# Patient Record
Sex: Female | Born: 1960 | Race: White | Hispanic: No | Marital: Married | Smoking: Former smoker
Health system: Southern US, Community
[De-identification: ages and names within clinical notes are randomized; demographics above are authoritative.]

## PROBLEM LIST (undated history)

## (undated) DIAGNOSIS — Z800 Family history of malignant neoplasm of digestive organs: Secondary | ICD-10-CM

## (undated) DIAGNOSIS — M3130 Wegener's granulomatosis without renal involvement: Secondary | ICD-10-CM

## (undated) DIAGNOSIS — Z803 Family history of malignant neoplasm of breast: Secondary | ICD-10-CM

## (undated) DIAGNOSIS — C801 Malignant (primary) neoplasm, unspecified: Secondary | ICD-10-CM

## (undated) DIAGNOSIS — Z9221 Personal history of antineoplastic chemotherapy: Secondary | ICD-10-CM

## (undated) DIAGNOSIS — Z923 Personal history of irradiation: Secondary | ICD-10-CM

## (undated) DIAGNOSIS — Z808 Family history of malignant neoplasm of other organs or systems: Secondary | ICD-10-CM

## (undated) DIAGNOSIS — Z8489 Family history of other specified conditions: Secondary | ICD-10-CM

## (undated) DIAGNOSIS — Z5189 Encounter for other specified aftercare: Secondary | ICD-10-CM

## (undated) HISTORY — PX: ABDOMINAL HYSTERECTOMY: SHX81

## (undated) HISTORY — PX: LUNG BIOPSY: SHX232

## (undated) HISTORY — DX: Family history of malignant neoplasm of breast: Z80.3

## (undated) HISTORY — PX: COLONOSCOPY: SHX174

## (undated) HISTORY — DX: Family history of malignant neoplasm of digestive organs: Z80.0

## (undated) HISTORY — DX: Family history of malignant neoplasm of other organs or systems: Z80.8

## (undated) HISTORY — PX: WISDOM TOOTH EXTRACTION: SHX21

## (undated) HISTORY — DX: Encounter for other specified aftercare: Z51.89

---

## 2019-07-02 ENCOUNTER — Other Ambulatory Visit: Payer: Self-pay | Admitting: Family Medicine

## 2019-07-02 DIAGNOSIS — N6310 Unspecified lump in the right breast, unspecified quadrant: Secondary | ICD-10-CM

## 2019-07-13 ENCOUNTER — Ambulatory Visit: Payer: BC Managed Care – PPO | Attending: Internal Medicine

## 2019-07-13 DIAGNOSIS — Z23 Encounter for immunization: Secondary | ICD-10-CM

## 2019-07-13 NOTE — Progress Notes (Signed)
   [facility] Vaccination [facility]  Name:  Debra [name]    MRN: [id number] DOB: 1961/02/27  07/13/2019  [name] was observed post Covid-19 immunization for 15 minutes without incident. She was provided with Vaccine Information Sheet and instruction to access the V-Safe system.   [name] was instructed to call 911 with any severe reactions post vaccine: Marland Kitchen Difficulty breathing  . Swelling of face and throat  . A fast heartbeat  . A bad rash all over body  . Dizziness and weakness   Immunizations Administered    Name Date Dose VIS Date Route   Pfizer COVID-19 Vaccine 07/13/2019  1:21 PM 0.3 mL 04/18/2019 Intramuscular   Manufacturer: [place]   Lot: [id number]   [place]: [id number]

## 2019-07-15 ENCOUNTER — Ambulatory Visit
Admission: RE | Admit: 2019-07-15 | Discharge: 2019-07-15 | Disposition: A | Discharge status: Home / Self Care | Payer: BC Managed Care – PPO | Source: Ambulatory Visit | Attending: Family Medicine | Admitting: Family Medicine

## 2019-07-15 ENCOUNTER — Other Ambulatory Visit: Payer: Self-pay | Admitting: Family Medicine

## 2019-07-15 ENCOUNTER — Ambulatory Visit (HOSPITAL_COMMUNITY): Admission: RE | Admit: 2019-07-15 | Payer: BC Managed Care – PPO | Source: Ambulatory Visit

## 2019-07-15 ENCOUNTER — Other Ambulatory Visit: Payer: Self-pay

## 2019-07-15 DIAGNOSIS — N6320 Unspecified lump in the left breast, unspecified quadrant: Secondary | ICD-10-CM

## 2019-07-15 DIAGNOSIS — R599 Enlarged lymph nodes, unspecified: Secondary | ICD-10-CM

## 2019-07-15 DIAGNOSIS — N6310 Unspecified lump in the right breast, unspecified quadrant: Secondary | ICD-10-CM

## 2019-07-21 ENCOUNTER — Ambulatory Visit
Admission: RE | Admit: 2019-07-21 | Discharge: 2019-07-21 | Disposition: A | Discharge status: Home / Self Care | Payer: BC Managed Care – PPO | Source: Ambulatory Visit | Attending: Family Medicine | Admitting: Family Medicine

## 2019-07-21 ENCOUNTER — Other Ambulatory Visit: Payer: Self-pay

## 2019-07-21 DIAGNOSIS — N6310 Unspecified lump in the right breast, unspecified quadrant: Secondary | ICD-10-CM

## 2019-07-21 DIAGNOSIS — R599 Enlarged lymph nodes, unspecified: Secondary | ICD-10-CM

## 2019-07-22 ENCOUNTER — Encounter: Payer: Self-pay | Admitting: *Deleted

## 2019-07-23 ENCOUNTER — Other Ambulatory Visit: Payer: Self-pay | Admitting: *Deleted

## 2019-07-23 DIAGNOSIS — Z170 Estrogen receptor positive status [ER+]: Secondary | ICD-10-CM | POA: Insufficient documentation

## 2019-07-23 DIAGNOSIS — C50211 Malignant neoplasm of upper-inner quadrant of right female breast: Secondary | ICD-10-CM | POA: Insufficient documentation

## 2019-07-29 NOTE — Progress Notes (Signed)
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  It appeared to increase in size over the next 3 weeks. Physical exam performed at [facility] 07/15/2019 confirmed a 6 mm firm, oval, palpable mass in the upper-inner right breast. She underwent bilateral diagnostic mammography with tomography and bilateral breast ultrasonography on 07/15/2019 showing: breast density category C; 4.6 cm mass in the right breast at 1 o'clock; single abnormal-appearing lymph node; bilateral benign cysts.  Accordingly on 07/21/2019 she proceeded to biopsy of the right breast area in question. 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 Prognostic indicators significant for: estrogen receptor, 60% positive with weak staining intensity and progesterone receptor, 0% negative. Proliferation marker Ki67 at 90%. HER2 equivocal by immunohistochemistry (2+), but negative by fluorescent in situ hybridization with a signals ratio 1.24 and number per cell 2.55.  The questionable right axillary lymph node was biopsied as well and was benign (concordant).  The patient's subsequent history is as detailed  below.   INTERVAL HISTORY: Debra [name] was evaluated in the multidisciplinary breast cancer clinic on 07/30/2019 accompanied by her husband Debra [name] Her case was also presented at the multidisciplinary breast cancer conference on the same day. At that time a preliminary plan was proposed: Neoadjuvant chemotherapy, definitive surgery with sentinel lymph node sampling, adjuvant radiation.   REVIEW OF SYSTEMS: The patient denies unusual headaches, visual changes, nausea, vomiting, stiff neck, dizziness, or gait imbalance. There has been no cough, phlegm production, or pleurisy, no chest pain or pressure, and no change in bowel or bladder habits. The patient denies fever, rash, bleeding, unexplained fatigue or unexplained weight loss.  She exercises regularly by walking between 6 and 10,000 steps most days, also plays golf and does tai chi most mornings.  A detailed review of systems was otherwise entirely negative.   PAST MEDICAL HISTORY: History reviewed. No pertinent past medical history.  PAST SURGICAL HISTORY: Past Surgical History:  Procedure Laterality Date   ABDOMINAL HYSTERECTOMY     LUNG BIOPSY    Status post bilateral salpingo-oophorectomy  FAMILY HISTORY: Family History  Problem Relation Age of Onset   Skin cancer Sister    Colon cancer Paternal Grandfather    Wilm's tumor Son     Her father is 60 years old as of 07/2019. Her mother was murdered at age 8 (domestic violence).  The patient has 3 sisters and no brothers. She reports a third cousin (grandmother's sister's daughter) with breast cancer in her 11's. She denies a family history of ovarian, prostate, or pancreatic cancer. She does report colon cancer in her paternal grandfather in his 39's, non-melanoma skin cancer in her sister in her 12's, and Wilms tumor in her son at 58 months.   GYNECOLOGIC HISTORY:  Patient's last menstrual period was 05/09/2011. Menarche: 60-40 years old Age at first live birth:  59 years old [place]  P 3 LMP 2013 Contraceptive never used HRT used for approximately 9 months  Hysterectomy? Yes, 2013 BSO? 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 Husband Debra [name] is an Art gallery manager. She lives at home with husband Debra [name] Daughter Debra [name], age 6, is a poet and Pharmacist, hospital in [place], [place]. Son Debra [name], age 60, is an Production designer, theatre/television/film in [place] in [place], [place]. Son Debra [name], age 60, is a Marketing executive working in Artist in [place], [place] (at the [employer]). Franceen has three grandchildren. She is a Media planner.    ADVANCED DIRECTIVES: In the absence of any documentation to the contrary, the patient's spouse is their HCPOA.    HEALTH MAINTENANCE: Social History   Tobacco Use   Smoking status: Former Smoker   Smokeless tobacco: Never Used  Substance Use Topics   Alcohol use: Yes   Drug use: Never     Colonoscopy: none on file  PAP: none on file (s/p hysterectomy)  Bone density: n/a (age)   No Known Allergies  Current Outpatient Medications  Medication Sig Dispense Refill   Multiple Vitamin (MULTI-VITAMIN DAILY PO) Take by mouth.     UNABLE TO FIND Omega Krill Oil     dexamethasone (DECADRON) 4 MG tablet Take 2 tablets by mouth daily starting the day after Carboplatin and Cytoxan x 3 days. Take with food. 30 tablet 1   lidocaine-prilocaine (EMLA) cream Apply to affected area once 30 g 3   loratadine (CLARITIN) 10 MG tablet Take 1 tablet (10 mg total) by mouth daily. 60 tablet 0   LORazepam (ATIVAN) 0.5 MG tablet Take 1 tablet (0.5 mg total) by mouth at bedtime as needed (Nausea or vomiting). 30 tablet 0   prochlorperazine (COMPAZINE) 10 MG tablet Take 1 tablet (10 mg total) by mouth every 6 (six) hours as needed (Nausea or vomiting). 30 tablet 1   No current facility-administered medications for this visit.    OBJECTIVE: White woman who appears  younger than stated age  59:   07/30/19 0856  BP: (!) 152/75  Pulse: 75  Resp: 18  Temp: (!) 97.4 F (36.3 C)  SpO2: 100%     There is no height or weight on file to calculate BMI.   Wt Readings from Last 3 Encounters:  07/30/19 206 lb 14.4 oz (93.8 kg)      ECOG FS:1 - Symptomatic but completely ambulatory  Ocular: Sclerae unicteric, pupils round and equal Ear-nose-throat: Wearing a mask Lymphatic: No cervical or supraclavicular adenopathy Lungs no rales or rhonchi Heart regular rate and rhythm Abd soft, nontender, positive bowel sounds MSK no focal spinal tenderness, no joint edema Neuro: non-focal, well-oriented, appropriate affect Breasts: The right breast is status post recent biopsy.  There is no significant ecchymosis.  There is an easily palpable mass in the upper inner quadrant which is movable, not associated with any skin or nipple change.  Left breast is benign.  Both axillae are benign.   LAB RESULTS:  CMP     Component Value Date/Time   NA 142 07/30/2019 0825   K 4.0 07/30/2019 0825   CL 110 07/30/2019 0825   CO2 25 07/30/2019 0825   GLUCOSE 88 07/30/2019 0825   BUN 11 07/30/2019 0825   CREATININE 0.76 07/30/2019 0825   CALCIUM 9.6 07/30/2019 0825   PROT 7.1 07/30/2019 0825   ALBUMIN 4.0 07/30/2019 0825   AST 14 (  L) 07/30/2019 0825   ALT 11 07/30/2019 0825   ALKPHOS 88 07/30/2019 0825   BILITOT 0.4 07/30/2019 0825   GFRNONAA >60 07/30/2019 0825   GFRAA >60 07/30/2019 0825    No results found for: TOTALPROTELP, ALBUMINELP, A1GS, A2GS, BETS, BETA2SER, GAMS, MSPIKE, SPEI  Lab Results  Component Value Date   WBC 7.8 07/30/2019   NEUTROABS 5.1 07/30/2019   HGB 13.2 07/30/2019   HCT 42.0 07/30/2019   MCV 89.4 07/30/2019   PLT 291 07/30/2019    No results found for: LABCA2  No components found for: GLOVFI433  No results for input(s): INR in the last 168 hours.  No results found for: LABCA2  No results found for: IRJ188  No results  found for: CZY606  No results found for: TKZ601  No results found for: CA2729  No components found for: HGQUANT  No results found for: CEA1 / No results found for: CEA1   No results found for: AFPTUMOR  No results found for: CHROMOGRNA  No results found for: KPAFRELGTCHN, LAMBDASER, KAPLAMBRATIO (kappa/lambda light chains)  No results found for: HGBA, HGBA2QUANT, HGBFQUANT, HGBSQUAN (Hemoglobinopathy evaluation)   No results found for: LDH  No results found for: IRON, TIBC, IRONPCTSAT (Iron and TIBC)  No results found for: FERRITIN  Urinalysis No results found for: COLORURINE, APPEARANCEUR, LABSPEC, PHURINE, GLUCOSEU, HGBUR, BILIRUBINUR, KETONESUR, PROTEINUR, UROBILINOGEN, NITRITE, LEUKOCYTESUR   STUDIES: US BREAST LTD UNI LEFT INC AXILLA  Result Date: 07/15/2019 CLINICAL DATA:  Mass felt by the patient in the upper inner right breast for the past 3 weeks, increased in size during that time. EXAM: DIGITAL DIAGNOSTIC BILATERAL MAMMOGRAM WITH CAD AND TOMO ULTRASOUND BILATERAL BREAST COMPARISON:  Previous examinations in [place], the most recent dated 09/11/2016. ACR Breast Density Category c: The breast tissue is heterogeneously dense, which may obscure small masses. FINDINGS: There is an interval irregular mass with some circumscribed and some indistinct margins in the upper inner right breast, corresponding to the mass felt by the patient, marked with a metallic marker. Multiple interval small, rounded and oval, circumscribed masses are demonstrated in the upper outer and upper inner quadrants of the left breast. There are no abnormal appearing axillary lymph nodes on either side. Mammographic images were processed with CAD. On physical exam, there is an approximately 6 x 5 cm firm, oval palpable mass in the upper inner quadrant of the right breast. There are no palpable axillary nodes on the right. Targeted ultrasound is performed, showing an irregular, heterogeneous, predominantly  hypoechoic mass in 1 o'clock position of the right breast, 5 cm from the nipple, corresponding to the palpable mass. This measures 4.6 x 3.5 x 1.9 cm. There is some surrounding ill-defined increased echogenicity. There is a nearby 5 mm cyst containing low-level internal echoes in the 12:30 o'clock position of the right breast, 3 cm from the nipple. This appeared more clearly cystic at real-time with harmonics. Ultrasound of the right axilla demonstrated a single right axillary lymph node with eccentric cortical thickening measuring 3.6 mm in maximum thickness. The remainder of the right axillary lymph nodes have normal appearances. Ultrasound of the left breast demonstrates multiple cysts. The largest is a bilobed cyst with some internal debris in the 1 o'clock retroareolar region. This corresponds to the largest mammographic mass. No solid masses were seen. IMPRESSION: 1. 4.6 cm mass in the 1 o'clock position of the right breast with imaging features highly suspicious for malignancy. 2. Single abnormal appearing right axillary lymph node suspicious for a  metastatic node. 3. Bilateral benign breast cysts. RECOMMENDATION: Ultrasound-guided core needle biopsy of the 4.6 cm mass in the 1 o'clock of the right breast and ultrasound-guided core needle biopsy of the abnormal appearing right axillary lymph node. This has been discussed with the patient and the biopsies have been scheduled at 1:45 p.m. on 07/21/2019. I have discussed the findings and recommendations with the patient. If applicable, a reminder letter will be sent to the patient regarding the next appointment. BI-RADS CATEGORY  5: Highly suggestive of malignancy. Electronically Signed   By: Claudie Revering M.D.   On: 07/15/2019 16:04   US BREAST LTD UNI RIGHT INC AXILLA  Result Date: 07/15/2019 CLINICAL DATA:  Mass felt by the patient in the upper inner right breast for the past 3 weeks, increased in size during that time. EXAM: DIGITAL DIAGNOSTIC BILATERAL  MAMMOGRAM WITH CAD AND TOMO ULTRASOUND BILATERAL BREAST COMPARISON:  Previous examinations in [place], the most recent dated 09/11/2016. ACR Breast Density Category c: The breast tissue is heterogeneously dense, which may obscure small masses. FINDINGS: There is an interval irregular mass with some circumscribed and some indistinct margins in the upper inner right breast, corresponding to the mass felt by the patient, marked with a metallic marker. Multiple interval small, rounded and oval, circumscribed masses are demonstrated in the upper outer and upper inner quadrants of the left breast. There are no abnormal appearing axillary lymph nodes on either side. Mammographic images were processed with CAD. On physical exam, there is an approximately 6 x 5 cm firm, oval palpable mass in the upper inner quadrant of the right breast. There are no palpable axillary nodes on the right. Targeted ultrasound is performed, showing an irregular, heterogeneous, predominantly hypoechoic mass in 1 o'clock position of the right breast, 5 cm from the nipple, corresponding to the palpable mass. This measures 4.6 x 3.5 x 1.9 cm. There is some surrounding ill-defined increased echogenicity. There is a nearby 5 mm cyst containing low-level internal echoes in the 12:30 o'clock position of the right breast, 3 cm from the nipple. This appeared more clearly cystic at real-time with harmonics. Ultrasound of the right axilla demonstrated a single right axillary lymph node with eccentric cortical thickening measuring 3.6 mm in maximum thickness. The remainder of the right axillary lymph nodes have normal appearances. Ultrasound of the left breast demonstrates multiple cysts. The largest is a bilobed cyst with some internal debris in the 1 o'clock retroareolar region. This corresponds to the largest mammographic mass. No solid masses were seen. IMPRESSION: 1. 4.6 cm mass in the 1 o'clock position of the right breast with imaging features highly  suspicious for malignancy. 2. Single abnormal appearing right axillary lymph node suspicious for a metastatic node. 3. Bilateral benign breast cysts. RECOMMENDATION: Ultrasound-guided core needle biopsy of the 4.6 cm mass in the 1 o'clock of the right breast and ultrasound-guided core needle biopsy of the abnormal appearing right axillary lymph node. This has been discussed with the patient and the biopsies have been scheduled at 1:45 p.m. on 07/21/2019. I have discussed the findings and recommendations with the patient. If applicable, a reminder letter will be sent to the patient regarding the next appointment. BI-RADS CATEGORY  5: Highly suggestive of malignancy. Electronically Signed   By: Claudie Revering M.D.   On: 07/15/2019 16:04   MM DIAG BREAST TOMO BILATERAL  Result Date: 07/15/2019 CLINICAL DATA:  Mass felt by the patient in the upper inner right breast for the past 3 weeks, increased  in size during that time. EXAM: DIGITAL DIAGNOSTIC BILATERAL MAMMOGRAM WITH CAD AND TOMO ULTRASOUND BILATERAL BREAST COMPARISON:  Previous examinations in [place], the most recent dated 09/11/2016. ACR Breast Density Category c: The breast tissue is heterogeneously dense, which may obscure small masses. FINDINGS: There is an interval irregular mass with some circumscribed and some indistinct margins in the upper inner right breast, corresponding to the mass felt by the patient, marked with a metallic marker. Multiple interval small, rounded and oval, circumscribed masses are demonstrated in the upper outer and upper inner quadrants of the left breast. There are no abnormal appearing axillary lymph nodes on either side. Mammographic images were processed with CAD. On physical exam, there is an approximately 6 x 5 cm firm, oval palpable mass in the upper inner quadrant of the right breast. There are no palpable axillary nodes on the right. Targeted ultrasound is performed, showing an irregular, heterogeneous, predominantly  hypoechoic mass in 1 o'clock position of the right breast, 5 cm from the nipple, corresponding to the palpable mass. This measures 4.6 x 3.5 x 1.9 cm. There is some surrounding ill-defined increased echogenicity. There is a nearby 5 mm cyst containing low-level internal echoes in the 12:30 o'clock position of the right breast, 3 cm from the nipple. This appeared more clearly cystic at real-time with harmonics. Ultrasound of the right axilla demonstrated a single right axillary lymph node with eccentric cortical thickening measuring 3.6 mm in maximum thickness. The remainder of the right axillary lymph nodes have normal appearances. Ultrasound of the left breast demonstrates multiple cysts. The largest is a bilobed cyst with some internal debris in the 1 o'clock retroareolar region. This corresponds to the largest mammographic mass. No solid masses were seen. IMPRESSION: 1. 4.6 cm mass in the 1 o'clock position of the right breast with imaging features highly suspicious for malignancy. 2. Single abnormal appearing right axillary lymph node suspicious for a metastatic node. 3. Bilateral benign breast cysts. RECOMMENDATION: Ultrasound-guided core needle biopsy of the 4.6 cm mass in the 1 o'clock of the right breast and ultrasound-guided core needle biopsy of the abnormal appearing right axillary lymph node. This has been discussed with the patient and the biopsies have been scheduled at 1:45 p.m. on 07/21/2019. I have discussed the findings and recommendations with the patient. If applicable, a reminder letter will be sent to the patient regarding the next appointment. BI-RADS CATEGORY  5: Highly suggestive of malignancy. Electronically Signed   By: Claudie Revering M.D.   On: 07/15/2019 16:04   [place] AXILLARY NODE CORE BIOPSY RIGHT  Addendum Date: 07/23/2019   ADDENDUM REPORT: 07/22/2019 15:04 ADDENDUM: Pathology revealed POORLY DIFFERENTIATED GRADE III INVASIVE DUCTAL CARCINOMA WITH METAPLASTIC FEATURES of the RIGHT  breast, 1 o'clock. This was found to be concordant by Dr. Dorise Bullion. Pathology revealed BENIGN LYMPH NODE of the RIGHT axilla. This was found to be concordant by Dr. Dorise Bullion. Pathology results were discussed with the patient by telephone. The patient reported doing well after the biopsies with tenderness at the sites. Post biopsy instructions and care were reviewed and questions were answered. The patient was encouraged to call [facility] for any additional concerns. The patient was referred to [facility] on July 30, 2019. Pathology results reported by Stacie Acres RN on 07/22/2019. Electronically Signed   By: Dorise Bullion III M.D   On: 07/22/2019 15:04   Result Date: 07/23/2019 CLINICAL DATA:  Biopsy of a right breast mass and a right axillary lymph node. EXAM: ULTRASOUND GUIDED RIGHT BREAST CORE NEEDLE BIOPSY COMPARISON:  Previous exam(s). PROCEDURE: I met with the patient and we discussed the procedure of ultrasound-guided biopsy, including benefits and alternatives. We discussed the high likelihood of a successful procedure. We discussed the risks of the procedure, including infection, bleeding, tissue injury, clip migration, and inadequate sampling. Informed written consent was given. The usual time-out protocol was performed immediately prior to the procedure. Lesion quadrant: 1 o'clock Using sterile technique and 1% Lidocaine as local anesthetic, under direct ultrasound visualization, a 12 gauge spring-loaded device was used to perform biopsy of a 1 o'clock right breast mass using a lateral approach. At the conclusion of the procedure tissue marker clip was deployed into the biopsy cavity. Follow up 2 view mammogram was performed and dictated separately. I met with the patient and we discussed the procedure of ultrasound-guided biopsy, including benefits and alternatives. We discussed the  high likelihood of a successful procedure. We discussed the risks of the procedure, including infection, bleeding, tissue injury, clip migration, and inadequate sampling. Informed written consent was given. The usual time-out protocol was performed immediately prior to the procedure. Lesion quadrant: A right axillary lymph node Using sterile technique and 1% Lidocaine as local anesthetic, under direct ultrasound visualization, a 14 gauge spring-loaded device was used to perform biopsy of right axillary lymph node using a lateral approach. At the conclusion of the procedure a HydroMARK tissue marker clip was deployed into the biopsy cavity. Follow up 2 view mammogram was performed and dictated separately. IMPRESSION: Ultrasound guided biopsy of a right breast mass and a right axillary lymph node. No apparent complications. Electronically Signed: By: Dorise Bullion III M.D On: 07/21/2019 14:27   MM CLIP PLACEMENT RIGHT  Result Date: 07/21/2019 CLINICAL DATA:  Evaluate biopsy marker EXAM: DIAGNOSTIC RIGHT MAMMOGRAM POST ULTRASOUND BIOPSY COMPARISON:  Previous exam(s). FINDINGS: Mammographic images were obtained following ultrasound guided biopsy of a right breast mass and a right axillary lymph node. The biopsy marking clip is in expected position at the site of biopsy. IMPRESSION: A ribbon shaped clip is within the biopsied right breast mass. A HydroMARK clip is within a right axillary lymph node. Final Assessment: Post Procedure Mammograms for Marker Placement Electronically Signed   By: Dorise Bullion III M.D   On: 07/21/2019 14:28   [place] RT BREAST BX W LOC DEV 1ST LESION IMG BX SPEC US GUIDE  Addendum Date: 07/23/2019   ADDENDUM REPORT: 07/22/2019 15:04 ADDENDUM: Pathology revealed POORLY DIFFERENTIATED GRADE III INVASIVE DUCTAL CARCINOMA WITH METAPLASTIC FEATURES of the RIGHT breast, 1 o'clock. This was found to be concordant by Dr. Dorise Bullion. Pathology revealed BENIGN LYMPH NODE of the RIGHT axilla.  This was found to be concordant by Dr. Dorise Bullion. Pathology results were discussed with the patient by telephone. The patient reported doing well after the biopsies with tenderness at the sites. Post biopsy instructions and care were reviewed and questions were answered. The patient was encouraged to call [facility] for any additional concerns. The patient was referred to [facility] on July 30, 2019. Pathology results reported by Stacie Acres RN on 07/22/2019. Electronically Signed   By: Dorise Bullion III M.D   On: 07/22/2019 15:04   Result Date: 07/23/2019 CLINICAL DATA:  Biopsy of a right breast mass and a right axillary lymph node. EXAM: ULTRASOUND GUIDED RIGHT  BREAST CORE NEEDLE BIOPSY COMPARISON:  Previous exam(s). PROCEDURE: I met with the patient and we discussed the procedure of ultrasound-guided biopsy, including benefits and alternatives. We discussed the high likelihood of a successful procedure. We discussed the risks of the procedure, including infection, bleeding, tissue injury, clip migration, and inadequate sampling. Informed written consent was given. The usual time-out protocol was performed immediately prior to the procedure. Lesion quadrant: 1 o'clock Using sterile technique and 1% Lidocaine as local anesthetic, under direct ultrasound visualization, a 12 gauge spring-loaded device was used to perform biopsy of a 1 o'clock right breast mass using a lateral approach. At the conclusion of the procedure tissue marker clip was deployed into the biopsy cavity. Follow up 2 view mammogram was performed and dictated separately. I met with the patient and we discussed the procedure of ultrasound-guided biopsy, including benefits and alternatives. We discussed the high likelihood of a successful procedure. We discussed the risks of the procedure, including infection, bleeding, tissue injury,  clip migration, and inadequate sampling. Informed written consent was given. The usual time-out protocol was performed immediately prior to the procedure. Lesion quadrant: A right axillary lymph node Using sterile technique and 1% Lidocaine as local anesthetic, under direct ultrasound visualization, a 14 gauge spring-loaded device was used to perform biopsy of right axillary lymph node using a lateral approach. At the conclusion of the procedure a HydroMARK tissue marker clip was deployed into the biopsy cavity. Follow up 2 view mammogram was performed and dictated separately. IMPRESSION: Ultrasound guided biopsy of a right breast mass and a right axillary lymph node. No apparent complications. Electronically Signed: By: Dorise Bullion III M.D On: 07/21/2019 14:27     ELIGIBLE FOR AVAILABLE RESEARCH PROTOCOL: AET  ASSESSMENT: 59 y.o. 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 Specifically we discussed the biology of her breast cancer, its diagnosis, staging, treatment  options and prognosis. We first reviewed the fact that cancer is not one disease but more than 100 different diseases and that it is important to keep them separate-- otherwise when friends and relatives discuss their own cancer experiences with Debra [name] confusion can result. Similarly we explained that if breast cancer spreads to the bone or liver, the patient would not have bone cancer or liver cancer, but breast cancer in the  bone and breast cancer in the liver: one cancer in three places-- not 3 different cancers which otherwise would have to be treated in 3 different ways.  We discussed the difference between local and systemic therapy. In terms of loco-regional treatment, lumpectomy plus radiation is equivalent to mastectomy as far as survival is concerned. For this reason, and because the cosmetic results are generally superior, we recommend breast conserving surgery.   We also noted that in terms of sequencing of treatments, whether systemic therapy or surgery is done first does not affect the ultimate outcome.  This is relevant to Debra [name] situation because first it will make her surgery easier; second it will provide her with prognostic information since she will have a better than 60% chance  of obtaining a complete pathologic response (which would predict an excellent long-term prognosis).  We then discussed the rationale for systemic therapy. There is some risk that this cancer may have already spread to other parts of her body. Patients frequently ask at this point about bone scans, CAT scans and PET scans to find out if they have occult breast cancer somewhere else. The problem is that in early stage disease we are much more likely to find false positives then true cancers and this would expose the patient to unnecessary procedures as well as unnecessary radiation. Scans cannot answer the question the patient really would like to know, which is whether she has microscopic disease elsewhere in her body. For those reasons we do not recommend them.  Of course we would proceed to aggressive evaluation of any symptoms that might suggest metastatic disease, but that is not the case here.  Next we went over the options for systemic therapy which are anti-estrogens, anti-HER-2 immunotherapy, and chemotherapy. Debra [name] does not meet criteria for anti-HER-2 immunotherapy. She is a poor candidate for anti-estrogens, with  minimal estrogen receptor positivity and negative progesterone receptor.  Her cancer, which has metaplastic features, is best considered as functionally triple negative.  Accordingly at chemotherapy is her only option for systemic therapy and we specifically discussed doxorubicin and cyclophosphamide in dose dense fashion x4 followed by weekly paclitaxel and carboplatin.  With this treatment in the pathologic complete response in patients like her is greater than 60%.  This means when the surgeon finally goes in to remove the area of the cancer, the pathologist sees no residual cancer in the breast or in the sentinel lymph nodes.  Patient who achieve this result have an excellent long-term prognosis.  We discussed the possible toxicity side effects and complications of the agents in question and the patient also will need with our chemotherapy teaching nurse to go over this in more detail.  She will have a port placed on 08/06/2019 and start chemotherapy the next day.  She will need an echocardiogram prior to starting treatment.  Her breast MRI is scheduled for the day after chemotherapy and that is adequate.  Debra [name] has a good understanding of the overall plan. She agrees with it. She knows the goal of treatment in her case is cure. She will call with any problems that may develop before her next visit here.  Total encounter time 65 minutes.Debra Jews C. Azure Budnick, MD 07/30/2019 12:28 PM Medical Oncology and Hematology [facility] [place], [place] [address] Tel. [phone]    Fax. [phone]   This document serves as a record of services personally performed by Lurline Del, MD. It was created on his behalf by Wilburn Mylar, a trained medical scribe. The creation of this record is based on the scribe's personal observations and the provider's statements to them.   I, Lurline Del MD, have reviewed the above documentation for accuracy and completeness, and  I agree with the above.    *Total Encounter Time as defined by the Centers for Medicare and Medicaid Services includes, in addition to the face-to-face time of a patient visit (documented in the note above) non-face-to-face time: obtaining and reviewing outside history, ordering and reviewing medications, tests or procedures, care coordination (communications with other health care professionals or caregivers) and documentation in the medical record.

## 2019-07-30 ENCOUNTER — Ambulatory Visit: Payer: BC Managed Care – PPO | Attending: Surgery | Admitting: Physical Therapy

## 2019-07-30 ENCOUNTER — Other Ambulatory Visit: Payer: Self-pay | Admitting: *Deleted

## 2019-07-30 ENCOUNTER — Encounter: Payer: Self-pay | Admitting: Physical Therapy

## 2019-07-30 ENCOUNTER — Inpatient Hospital Stay: Payer: BC Managed Care – PPO | Attending: Oncology | Admitting: Oncology

## 2019-07-30 ENCOUNTER — Inpatient Hospital Stay: Payer: BC Managed Care – PPO

## 2019-07-30 ENCOUNTER — Ambulatory Visit
Admission: RE | Admit: 2019-07-30 | Discharge: 2019-07-30 | Disposition: A | Discharge status: Home / Self Care | Payer: BC Managed Care – PPO | Source: Ambulatory Visit | Attending: Radiation Oncology | Admitting: Radiation Oncology

## 2019-07-30 ENCOUNTER — Encounter: Payer: Self-pay | Admitting: Oncology

## 2019-07-30 ENCOUNTER — Other Ambulatory Visit: Payer: Self-pay

## 2019-07-30 ENCOUNTER — Other Ambulatory Visit: Payer: Self-pay | Admitting: Surgery

## 2019-07-30 VITALS — BP 152/75 | HR 75 | Temp 97.4°F | Resp 18 | Ht 67.5 in | Wt 206.9 lb

## 2019-07-30 DIAGNOSIS — Z808 Family history of malignant neoplasm of other organs or systems: Secondary | ICD-10-CM | POA: Insufficient documentation

## 2019-07-30 DIAGNOSIS — C50211 Malignant neoplasm of upper-inner quadrant of right female breast: Secondary | ICD-10-CM

## 2019-07-30 DIAGNOSIS — Z79899 Other long term (current) drug therapy: Secondary | ICD-10-CM | POA: Insufficient documentation

## 2019-07-30 DIAGNOSIS — Z800 Family history of malignant neoplasm of digestive organs: Secondary | ICD-10-CM | POA: Insufficient documentation

## 2019-07-30 DIAGNOSIS — Z87891 Personal history of nicotine dependence: Secondary | ICD-10-CM | POA: Insufficient documentation

## 2019-07-30 DIAGNOSIS — Z170 Estrogen receptor positive status [ER+]: Secondary | ICD-10-CM

## 2019-07-30 DIAGNOSIS — R293 Abnormal posture: Secondary | ICD-10-CM | POA: Diagnosis present

## 2019-07-30 DIAGNOSIS — Z90722 Acquired absence of ovaries, bilateral: Secondary | ICD-10-CM | POA: Diagnosis not present

## 2019-07-30 DIAGNOSIS — Z90710 Acquired absence of both cervix and uterus: Secondary | ICD-10-CM | POA: Diagnosis not present

## 2019-07-30 DIAGNOSIS — Z803 Family history of malignant neoplasm of breast: Secondary | ICD-10-CM | POA: Insufficient documentation

## 2019-07-30 LAB — CMP (CANCER CENTER ONLY)
ALT: 11 U/L (ref 0–44)
AST: 14 U/L — ABNORMAL LOW (ref 15–41)
Albumin: 4 g/dL (ref 3.5–5.0)
Alkaline Phosphatase: 88 U/L (ref 38–126)
Anion gap: 7 (ref 5–15)
BUN: 11 mg/dL (ref 6–20)
CO2: 25 mmol/L (ref 22–32)
Calcium: 9.6 mg/dL (ref 8.9–10.3)
Chloride: 110 mmol/L (ref 98–111)
Creatinine: 0.76 mg/dL (ref 0.44–1.00)
GFR, Est AFR Am: >60 mL/min (ref >60)
GFR, Estimated: >60 mL/min (ref >60)
Glucose, Bld: 88 mg/dL (ref 70–99)
Potassium: 4 mmol/L (ref 3.5–5.1)
Sodium: 142 mmol/L (ref 135–145)
Total Bilirubin: 0.4 mg/dL (ref 0.3–1.2)
Total Protein: 7.1 g/dL (ref 6.5–8.1)

## 2019-07-30 LAB — CBC WITH DIFFERENTIAL (CANCER CENTER ONLY)
Abs Immature Granulocytes: 0.02 10*3/uL (ref 0.00–0.07)
Basophils Absolute: 0 10*3/uL (ref 0.0–0.1)
Basophils Relative: 1 %
Eosinophils Absolute: 0.2 10*3/uL (ref 0.0–0.5)
Eosinophils Relative: 2 %
HCT: 42 % (ref 36.0–46.0)
Hemoglobin: 13.2 g/dL (ref 12.0–15.0)
Immature Granulocytes: 0 %
Lymphocytes Relative: 26 %
Lymphs Abs: 2 10*3/uL (ref 0.7–4.0)
MCH: 28.1 pg (ref 26.0–34.0)
MCHC: 31.4 g/dL (ref 30.0–36.0)
MCV: 89.4 fL (ref 80.0–100.0)
Monocytes Absolute: 0.5 10*3/uL (ref 0.1–1.0)
Monocytes Relative: 6 %
Neutro Abs: 5.1 10*3/uL (ref 1.7–7.7)
Neutrophils Relative %: 65 %
Platelet Count: 291 10*3/uL (ref 150–400)
RBC: 4.7 MIL/uL (ref 3.87–5.11)
RDW: 12.8 % (ref 11.5–15.5)
WBC Count: 7.8 10*3/uL (ref 4.0–10.5)
nRBC: 0 % (ref 0.0–0.2)

## 2019-07-30 LAB — GENETIC SCREENING ORDER

## 2019-07-30 MED ORDER — PROCHLORPERAZINE MALEATE 10 MG PO TABS: 10.0000 mg | ORAL_TABLET | Freq: Four times a day (QID) | ORAL | 1 refills | Status: DC | PRN

## 2019-07-30 MED ORDER — LIDOCAINE-PRILOCAINE 2.5-2.5 % EX CREA: TOPICAL_CREAM | CUTANEOUS | 3 refills | Status: DC

## 2019-07-30 MED ORDER — LORAZEPAM 0.5 MG PO TABS: 0.5000 mg | ORAL_TABLET | Freq: Every evening | ORAL | 0 refills | Status: DC | PRN

## 2019-07-30 MED ORDER — LORATADINE 10 MG PO TABS: 10.0000 mg | ORAL_TABLET | Freq: Every day | ORAL | 0 refills | Status: DC

## 2019-07-30 MED ORDER — DEXAMETHASONE 4 MG PO TABS: ORAL_TABLET | ORAL | 1 refills | Status: DC

## 2019-07-30 NOTE — Progress Notes (Addendum)
Radiation Oncology         [phone]) [phone] ________________________________  Name: Debra [name]        MRN: [id number]  Date of Service: 07/30/2019 DOB: 03/30/1961  CC:[facility], [facility]     REFERRING PHYSICIAN: Dr. Ninfa Linden  DIAGNOSIS: The encounter diagnosis was Malignant neoplasm of upper-inner quadrant of right breast in female, estrogen receptor positive ([place]).   HISTORY OF PRESENT ILLNESS: Debra [name] is a 59 y.o. female seen in the multidisciplinary breast clinic for a new diagnosis of right breast cancer. The patient was noted to have a palpable mass in the right breast for approximately 3 weeks.  She underwent diagnostic imaging which revealed a mass at the 1 o'clock position measuring 4.3 x 3.5 x 1.9 cm.  She had one abnormal appearing lymph node in the axilla on the right side.  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  Her lymph node biopsy was also benign.  She is seen today to discuss treatment recommendations for her cancer.    PREVIOUS RADIATION THERAPY: No   PAST MEDICAL HISTORY: No past medical history on file.     PAST SURGICAL HISTORY: Past Surgical History:  Procedure Laterality Date  . ABDOMINAL HYSTERECTOMY    . LUNG BIOPSY       FAMILY HISTORY:  Family History  Problem Relation Age of Onset  . Skin cancer Sister   . Colon cancer Paternal Grandfather   . Wilm's tumor Son      SOCIAL HISTORY:  reports that she has quit smoking. She has never used smokeless tobacco. She reports current alcohol use. She reports that she does not use drugs. The patient is married and lives in [place]. 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   ALLERGIES: Patient has no known  allergies.   MEDICATIONS:  Current Outpatient Medications  Medication Sig Dispense Refill  . dexamethasone (DECADRON) 4 MG tablet Take 2 tablets by mouth daily starting the day after Carboplatin and Cytoxan x 3 days. Take with food. 30 tablet 1  . lidocaine-prilocaine (EMLA) cream Apply to affected area once 30 g 3  . loratadine (CLARITIN) 10 MG tablet Take 1 tablet (10 mg total) by mouth daily. 60 tablet 0  . LORazepam (ATIVAN) 0.5 MG tablet Take 1 tablet (0.5 mg total) by mouth at bedtime as needed (Nausea or vomiting). 30 tablet 0  . Multiple Vitamin (MULTI-VITAMIN DAILY PO) Take by mouth.    . prochlorperazine (COMPAZINE) 10 MG tablet Take 1 tablet (10 mg total) by mouth every 6 (six) hours as needed (Nausea or vomiting). 30 tablet 1  . UNABLE TO FIND Omega Krill Oil     No current facility-administered medications for this encounter.     REVIEW OF SYSTEMS: On review of systems, the patient reports that she is doing well overall. She denies any chest pain, shortness of breath, cough, fevers, chills, night sweats, unintended weight changes. She denies any bowel or bladder disturbances, and denies abdominal pain, nausea or vomiting. She denies any new musculoskeletal or joint aches or pains. A complete review of systems is obtained and is otherwise negative.     PHYSICAL EXAM:  Wt Readings from Last 3 Encounters:  07/30/19 206 lb 14.4 oz (93.8 kg)   Temp  Readings from Last 3 Encounters:  07/30/19 (!) 97.4 F (36.3 C) (Temporal)   BP Readings from Last 3 Encounters:  07/30/19 (!) 152/75   Pulse Readings from Last 3 Encounters:  07/30/19 75    In general this is a well appearing caucasian female in no acute distress. She's alert and oriented x4 and appropriate throughout the examination. Cardiopulmonary assessment is negative for acute distress and she exhibits normal effort. Bilateral breast exam is deferred.    ECOG = 0  0 - Asymptomatic (Fully active, able to carry on  all predisease activities without restriction)  1 - Symptomatic but completely ambulatory (Restricted in physically strenuous activity but ambulatory and able to carry out work of a light or sedentary nature. For example, light housework, office work)  2 - Symptomatic, <50% in bed during the day (Ambulatory and capable of all self care but unable to carry out any work activities. Up and about more than 50% of waking hours)  3 - Symptomatic, >50% in bed, but not bedbound (Capable of only limited self-care, confined to bed or chair 50% or more of waking hours)  4 - Bedbound (Completely disabled. Cannot carry on any self-care. Totally confined to bed or chair)  5 - Death   Eustace Pen MM, Creech RH, Tormey DC, et al. [phone]). "Toxicity and response criteria of the [facility]". [place] Oncol. 5 (6): 649-55    LABORATORY DATA:  Lab Results  Component Value Date   WBC 7.8 07/30/2019   HGB 13.2 07/30/2019   HCT 42.0 07/30/2019   MCV 89.4 07/30/2019   PLT 291 07/30/2019   Lab Results  Component Value Date   NA 142 07/30/2019   K 4.0 07/30/2019   CL 110 07/30/2019   CO2 25 07/30/2019   Lab Results  Component Value Date   ALT 11 07/30/2019   AST 14 (L) 07/30/2019   ALKPHOS 88 07/30/2019   BILITOT 0.4 07/30/2019      RADIOGRAPHY: US BREAST LTD UNI LEFT INC AXILLA  Result Date: 07/15/2019 CLINICAL DATA:  Mass felt by the patient in the upper inner right breast for the past 3 weeks, increased in size during that time. EXAM: DIGITAL DIAGNOSTIC BILATERAL MAMMOGRAM WITH CAD AND TOMO ULTRASOUND BILATERAL BREAST COMPARISON:  Previous examinations in [place], the most recent dated 09/11/2016. ACR Breast Density Category c: The breast tissue is heterogeneously dense, which may obscure small masses. FINDINGS: There is an interval irregular mass with some circumscribed and some indistinct margins in the upper inner right breast, corresponding to the mass felt by the patient,  marked with a metallic marker. Multiple interval small, rounded and oval, circumscribed masses are demonstrated in the upper outer and upper inner quadrants of the left breast. There are no abnormal appearing axillary lymph nodes on either side. Mammographic images were processed with CAD. On physical exam, there is an approximately 6 x 5 cm firm, oval palpable mass in the upper inner quadrant of the right breast. There are no palpable axillary nodes on the right. Targeted ultrasound is performed, showing an irregular, heterogeneous, predominantly hypoechoic mass in 1 o'clock position of the right breast, 5 cm from the nipple, corresponding to the palpable mass. This measures 4.6 x 3.5 x 1.9 cm. There is some surrounding ill-defined increased echogenicity. There is a nearby 5 mm cyst containing low-level internal echoes in the 12:30 o'clock position of the right breast, 3 cm from the nipple. This appeared more clearly cystic at real-time with  harmonics. Ultrasound of the right axilla demonstrated a single right axillary lymph node with eccentric cortical thickening measuring 3.6 mm in maximum thickness. The remainder of the right axillary lymph nodes have normal appearances. Ultrasound of the left breast demonstrates multiple cysts. The largest is a bilobed cyst with some internal debris in the 1 o'clock retroareolar region. This corresponds to the largest mammographic mass. No solid masses were seen. IMPRESSION: 1. 4.6 cm mass in the 1 o'clock position of the right breast with imaging features highly suspicious for malignancy. 2. Single abnormal appearing right axillary lymph node suspicious for a metastatic node. 3. Bilateral benign breast cysts. RECOMMENDATION: Ultrasound-guided core needle biopsy of the 4.6 cm mass in the 1 o'clock of the right breast and ultrasound-guided core needle biopsy of the abnormal appearing right axillary lymph node. This has been discussed with the patient and the biopsies have been  scheduled at 1:45 p.m. on 07/21/2019. I have discussed the findings and recommendations with the patient. If applicable, a reminder letter will be sent to the patient regarding the next appointment. BI-RADS CATEGORY  5: Highly suggestive of malignancy. Electronically Signed   By: Claudie Revering M.D.   On: 07/15/2019 16:04   US BREAST LTD UNI RIGHT INC AXILLA  Result Date: 07/15/2019 CLINICAL DATA:  Mass felt by the patient in the upper inner right breast for the past 3 weeks, increased in size during that time. EXAM: DIGITAL DIAGNOSTIC BILATERAL MAMMOGRAM WITH CAD AND TOMO ULTRASOUND BILATERAL BREAST COMPARISON:  Previous examinations in [place], the most recent dated 09/11/2016. ACR Breast Density Category c: The breast tissue is heterogeneously dense, which may obscure small masses. FINDINGS: There is an interval irregular mass with some circumscribed and some indistinct margins in the upper inner right breast, corresponding to the mass felt by the patient, marked with a metallic marker. Multiple interval small, rounded and oval, circumscribed masses are demonstrated in the upper outer and upper inner quadrants of the left breast. There are no abnormal appearing axillary lymph nodes on either side. Mammographic images were processed with CAD. On physical exam, there is an approximately 6 x 5 cm firm, oval palpable mass in the upper inner quadrant of the right breast. There are no palpable axillary nodes on the right. Targeted ultrasound is performed, showing an irregular, heterogeneous, predominantly hypoechoic mass in 1 o'clock position of the right breast, 5 cm from the nipple, corresponding to the palpable mass. This measures 4.6 x 3.5 x 1.9 cm. There is some surrounding ill-defined increased echogenicity. There is a nearby 5 mm cyst containing low-level internal echoes in the 12:30 o'clock position of the right breast, 3 cm from the nipple. This appeared more clearly cystic at real-time with harmonics.  Ultrasound of the right axilla demonstrated a single right axillary lymph node with eccentric cortical thickening measuring 3.6 mm in maximum thickness. The remainder of the right axillary lymph nodes have normal appearances. Ultrasound of the left breast demonstrates multiple cysts. The largest is a bilobed cyst with some internal debris in the 1 o'clock retroareolar region. This corresponds to the largest mammographic mass. No solid masses were seen. IMPRESSION: 1. 4.6 cm mass in the 1 o'clock position of the right breast with imaging features highly suspicious for malignancy. 2. Single abnormal appearing right axillary lymph node suspicious for a metastatic node. 3. Bilateral benign breast cysts. RECOMMENDATION: Ultrasound-guided core needle biopsy of the 4.6 cm mass in the 1 o'clock of the right breast and ultrasound-guided core needle biopsy of the  abnormal appearing right axillary lymph node. This has been discussed with the patient and the biopsies have been scheduled at 1:45 p.m. on 07/21/2019. I have discussed the findings and recommendations with the patient. If applicable, a reminder letter will be sent to the patient regarding the next appointment. BI-RADS CATEGORY  5: Highly suggestive of malignancy. Electronically Signed   By: Claudie Revering M.D.   On: 07/15/2019 16:04   MM DIAG BREAST TOMO BILATERAL  Result Date: 07/15/2019 CLINICAL DATA:  Mass felt by the patient in the upper inner right breast for the past 3 weeks, increased in size during that time. EXAM: DIGITAL DIAGNOSTIC BILATERAL MAMMOGRAM WITH CAD AND TOMO ULTRASOUND BILATERAL BREAST COMPARISON:  Previous examinations in [place], the most recent dated 09/11/2016. ACR Breast Density Category c: The breast tissue is heterogeneously dense, which may obscure small masses. FINDINGS: There is an interval irregular mass with some circumscribed and some indistinct margins in the upper inner right breast, corresponding to the mass felt by the patient,  marked with a metallic marker. Multiple interval small, rounded and oval, circumscribed masses are demonstrated in the upper outer and upper inner quadrants of the left breast. There are no abnormal appearing axillary lymph nodes on either side. Mammographic images were processed with CAD. On physical exam, there is an approximately 6 x 5 cm firm, oval palpable mass in the upper inner quadrant of the right breast. There are no palpable axillary nodes on the right. Targeted ultrasound is performed, showing an irregular, heterogeneous, predominantly hypoechoic mass in 1 o'clock position of the right breast, 5 cm from the nipple, corresponding to the palpable mass. This measures 4.6 x 3.5 x 1.9 cm. There is some surrounding ill-defined increased echogenicity. There is a nearby 5 mm cyst containing low-level internal echoes in the 12:30 o'clock position of the right breast, 3 cm from the nipple. This appeared more clearly cystic at real-time with harmonics. Ultrasound of the right axilla demonstrated a single right axillary lymph node with eccentric cortical thickening measuring 3.6 mm in maximum thickness. The remainder of the right axillary lymph nodes have normal appearances. Ultrasound of the left breast demonstrates multiple cysts. The largest is a bilobed cyst with some internal debris in the 1 o'clock retroareolar region. This corresponds to the largest mammographic mass. No solid masses were seen. IMPRESSION: 1. 4.6 cm mass in the 1 o'clock position of the right breast with imaging features highly suspicious for malignancy. 2. Single abnormal appearing right axillary lymph node suspicious for a metastatic node. 3. Bilateral benign breast cysts. RECOMMENDATION: Ultrasound-guided core needle biopsy of the 4.6 cm mass in the 1 o'clock of the right breast and ultrasound-guided core needle biopsy of the abnormal appearing right axillary lymph node. This has been discussed with the patient and the biopsies have been  scheduled at 1:45 p.m. on 07/21/2019. I have discussed the findings and recommendations with the patient. If applicable, a reminder letter will be sent to the patient regarding the next appointment. BI-RADS CATEGORY  5: Highly suggestive of malignancy. Electronically Signed   By: Claudie Revering M.D.   On: 07/15/2019 16:04   [place] AXILLARY NODE CORE BIOPSY RIGHT  Addendum Date: 07/23/2019   ADDENDUM REPORT: 07/22/2019 15:04 ADDENDUM: Pathology revealed POORLY DIFFERENTIATED GRADE III INVASIVE DUCTAL CARCINOMA WITH METAPLASTIC FEATURES of the RIGHT breast, 1 o'clock. This was found to be concordant by Dr. Dorise Bullion. Pathology revealed BENIGN LYMPH NODE of the RIGHT axilla. This was found to be concordant by Dr. Shanon Brow  Williams. Pathology results were discussed with the patient by telephone. The patient reported doing well after the biopsies with tenderness at the sites. Post biopsy instructions and care were reviewed and questions were answered. The patient was encouraged to call [facility] for any additional concerns. The patient was referred to [facility] on July 30, 2019. Pathology results reported by Stacie Acres RN on 07/22/2019. Electronically Signed   By: Dorise Bullion III M.D   On: 07/22/2019 15:04   Result Date: 07/23/2019 CLINICAL DATA:  Biopsy of a right breast mass and a right axillary lymph node. EXAM: ULTRASOUND GUIDED RIGHT BREAST CORE NEEDLE BIOPSY COMPARISON:  Previous exam(s). PROCEDURE: I met with the patient and we discussed the procedure of ultrasound-guided biopsy, including benefits and alternatives. We discussed the high likelihood of a successful procedure. We discussed the risks of the procedure, including infection, bleeding, tissue injury, clip migration, and inadequate sampling. Informed written consent was given. The usual time-out protocol was performed immediately prior to the  procedure. Lesion quadrant: 1 o'clock Using sterile technique and 1% Lidocaine as local anesthetic, under direct ultrasound visualization, a 12 gauge spring-loaded device was used to perform biopsy of a 1 o'clock right breast mass using a lateral approach. At the conclusion of the procedure tissue marker clip was deployed into the biopsy cavity. Follow up 2 view mammogram was performed and dictated separately. I met with the patient and we discussed the procedure of ultrasound-guided biopsy, including benefits and alternatives. We discussed the high likelihood of a successful procedure. We discussed the risks of the procedure, including infection, bleeding, tissue injury, clip migration, and inadequate sampling. Informed written consent was given. The usual time-out protocol was performed immediately prior to the procedure. Lesion quadrant: A right axillary lymph node Using sterile technique and 1% Lidocaine as local anesthetic, under direct ultrasound visualization, a 14 gauge spring-loaded device was used to perform biopsy of right axillary lymph node using a lateral approach. At the conclusion of the procedure a HydroMARK tissue marker clip was deployed into the biopsy cavity. Follow up 2 view mammogram was performed and dictated separately. IMPRESSION: Ultrasound guided biopsy of a right breast mass and a right axillary lymph node. No apparent complications. Electronically Signed: By: Dorise Bullion III M.D On: 07/21/2019 14:27   MM CLIP PLACEMENT RIGHT  Result Date: 07/21/2019 CLINICAL DATA:  Evaluate biopsy marker EXAM: DIAGNOSTIC RIGHT MAMMOGRAM POST ULTRASOUND BIOPSY COMPARISON:  Previous exam(s). FINDINGS: Mammographic images were obtained following ultrasound guided biopsy of a right breast mass and a right axillary lymph node. The biopsy marking clip is in expected position at the site of biopsy. IMPRESSION: A ribbon shaped clip is within the biopsied right breast mass. A HydroMARK clip is within a  right axillary lymph node. Final Assessment: Post Procedure Mammograms for Marker Placement Electronically Signed   By: Dorise Bullion III M.D   On: 07/21/2019 14:28   [place] RT BREAST BX W LOC DEV 1ST LESION IMG BX SPEC US GUIDE  Addendum Date: 07/23/2019   ADDENDUM REPORT: 07/22/2019 15:04 ADDENDUM: Pathology revealed POORLY DIFFERENTIATED GRADE III INVASIVE DUCTAL CARCINOMA WITH METAPLASTIC FEATURES of the RIGHT breast, 1 o'clock. This was found to be concordant by Dr. Dorise Bullion. Pathology revealed BENIGN LYMPH NODE of the RIGHT axilla. This was found to be concordant by Dr. Dorise Bullion. Pathology results were discussed with the patient by telephone. The patient reported doing well after  the biopsies with tenderness at the sites. Post biopsy instructions and care were reviewed and questions were answered. The patient was encouraged to call [facility] for any additional concerns. The patient was referred to [facility] on July 30, 2019. Pathology results reported by Stacie Acres RN on 07/22/2019. Electronically Signed   By: Dorise Bullion III M.D   On: 07/22/2019 15:04   Result Date: 07/23/2019 CLINICAL DATA:  Biopsy of a right breast mass and a right axillary lymph node. EXAM: ULTRASOUND GUIDED RIGHT BREAST CORE NEEDLE BIOPSY COMPARISON:  Previous exam(s). PROCEDURE: I met with the patient and we discussed the procedure of ultrasound-guided biopsy, including benefits and alternatives. We discussed the high likelihood of a successful procedure. We discussed the risks of the procedure, including infection, bleeding, tissue injury, clip migration, and inadequate sampling. Informed written consent was given. The usual time-out protocol was performed immediately prior to the procedure. Lesion quadrant: 1 o'clock Using sterile technique and 1% Lidocaine as local anesthetic, under direct ultrasound  visualization, a 12 gauge spring-loaded device was used to perform biopsy of a 1 o'clock right breast mass using a lateral approach. At the conclusion of the procedure tissue marker clip was deployed into the biopsy cavity. Follow up 2 view mammogram was performed and dictated separately. I met with the patient and we discussed the procedure of ultrasound-guided biopsy, including benefits and alternatives. We discussed the high likelihood of a successful procedure. We discussed the risks of the procedure, including infection, bleeding, tissue injury, clip migration, and inadequate sampling. Informed written consent was given. The usual time-out protocol was performed immediately prior to the procedure. Lesion quadrant: A right axillary lymph node Using sterile technique and 1% Lidocaine as local anesthetic, under direct ultrasound visualization, a 14 gauge spring-loaded device was used to perform biopsy of right axillary lymph node using a lateral approach. At the conclusion of the procedure a HydroMARK tissue marker clip was deployed into the biopsy cavity. Follow up 2 view mammogram was performed and dictated separately. IMPRESSION: Ultrasound guided biopsy of a right breast mass and a right axillary lymph node. No apparent complications. Electronically Signed: By: Dorise Bullion III M.D On: 07/21/2019 14:27       IMPRESSION/PLAN: 1. Stage IIB, cT2N0 grade 3, weak ER positive, ER/HER2 negative invasive ductal carcinoma of the right breast. Dr. Lisbeth Renshaw discusses the pathology findings and reviews the nature of right breast disease. The consensus from the breast conference includes MRI of the breasts with anticipated neoadjuvant chemotherapy for downsizing her disease, followed by probable breast conservation with lumpectomy with sentinel node biopsy. If she proceeds with breast conservation, she would be a candidate for external radiotherapy to the breast followed by antiestrogen therapy. We discussed the  risks, benefits, short, and long term effects of radiotherapy, and the patient is interested in proceeding. Dr. Lisbeth Renshaw discusses the delivery and logistics of radiotherapy and anticipates a course of 4 - 6 1/2 weeks of radiotherapy. We will see her back about 2 weeks after surgery to discuss the simulation process and anticipate starting radiotherapy about 4-6 weeks after surgery.    In a visit lasting 45 minutes, greater than 50% of the time was spent face to face reviewing her case, as well as in preparation of, discussing, and coordinating the patient's care.  The above documentation reflects my direct findings during this shared patient visit. Please see the separate note by Dr. Lisbeth Renshaw  on this date for the remainder of the patient's plan of care.    Carola Rhine, PAC

## 2019-07-30 NOTE — Patient Instructions (Signed)

## 2019-07-30 NOTE — Progress Notes (Addendum)
START ON PATHWAY REGIMEN - Breast  PATIENT'S HEIGHT NEEDS TO BE CORRECTED PRIOR TO START OF THERAPY     A cycle is every 14 days (cycles 1-4):     Doxorubicin      Cyclophosphamide      Pegfilgrastim-xxxx    A cycle is every 21 days (cycles 5-8):     Paclitaxel      Carboplatin   **Always confirm dose/schedule in your pharmacy ordering system**  Patient Characteristics: Preoperative or Nonsurgical Candidate (Clinical Staging), Neoadjuvant Therapy followed by Surgery, Invasive Disease, Chemotherapy, HER2 Negative/Unknown/Equivocal, ER Negative/Unknown, Platinum Therapy Indicated Therapeutic Status: Preoperative or Nonsurgical Candidate (Clinical Staging) AJCC M Category: cM0 AJCC Grade: G3 Breast Surgical Plan: Neoadjuvant Therapy followed by Surgery ER Status: Negative (-) AJCC 8 Stage Grouping: IIB HER2 Status: Negative (-) AJCC T Category: cT2 AJCC N Category: cN0 PR Status: Negative (-) Type of Therapy: Platinum Therapy Indicated Intent of Therapy: Curative Intent, Discussed with Patient

## 2019-07-30 NOTE — Addendum Note (Signed)
Addended by: [name] on: 07/30/2019 12:51 PM   Modules accepted: Orders

## 2019-07-30 NOTE — Therapy (Signed)
[place], [place], [address] Phone: [phone]   Fax:  [phone]  Physical Therapy Evaluation  Patient Details  Name: Debra [name] MRN: [id number] Date of Birth: March 08, 1961 Referring Provider (PT): Dr. [name]   Encounter Date: 07/30/2019  PT End of Session - 07/30/19 0939    Visit Number  1    Number of Visits  2    Date for PT Re-Evaluation  09/24/19    PT Start Time  6381    PT Stop Time  0951   Also saw pt from 1030-1047 and 1200-1208 for a total of 40 minutes   PT Time Calculation (min)  15 min    Activity Tolerance  Patient tolerated treatment well    Behavior During Therapy  [facility] for tasks assessed/performed       History reviewed. No pertinent past medical history.  Past Surgical History:  Procedure Laterality Date  . ABDOMINAL HYSTERECTOMY    . LUNG BIOPSY      There were no vitals filed for this visit.   Subjective Assessment - 07/30/19 0927    Subjective  Patient reports she is here today to be seen by her medical team for her newly diagnosed right breast cancer.    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 It measures 4.6 cm and is located in the upper inner quadrant.    Patient Stated Goals  Reduce lymphedema and learn post op shoulder ROM HEP    Currently in Pain?  No/denies         [facility] PT Assessment - 07/30/19 0001      Assessment   Medical Diagnosis  Right breast cancer    Referring Provider (PT)  Dr. [name]    Onset Date/Surgical Date  07/15/19    Hand Dominance  Right    Prior Therapy  none      Precautions   Precautions  Other (comment)    Precaution Comments  active cancer      Restrictions   Weight Bearing Restrictions  No      Balance Screen   Has the patient fallen in the past 6  months  No    Has the patient had a decrease in activity level because of a fear of falling?   No    Is the patient reluctant to leave their home because of a fear of falling?   No      Home Film/video editor residence    Living Arrangements  Spouse/significant other    Available Help at Discharge  Family      Prior Function   Level of Independence  Independent    Vocation  Full time employment    Equities trader    Leisure  She does not exercise      Cognition   Overall Cognitive Status  Within Functional Limits for tasks assessed      Observation/Other Assessments   Observations  L-Dex score +1.8      Posture/Postural Control   Posture/Postural Control  Postural limitations    Postural Limitations  Rounded Shoulders;Forward head      ROM / Strength   AROM / PROM / Strength  AROM;Strength      AROM   AROM Assessment Site  Shoulder    Right/Left Shoulder  Right;Left    Right Shoulder Extension  47 Degrees    Right Shoulder Flexion  134 Degrees    Right Shoulder ABduction  146 Degrees    Right Shoulder Internal Rotation  66 Degrees    Right Shoulder External Rotation  88 Degrees    Left Shoulder Extension  50 Degrees    Left Shoulder Flexion  139 Degrees    Left Shoulder ABduction  145 Degrees    Left Shoulder Internal Rotation  59 Degrees    Left Shoulder External Rotation  90 Degrees      Strength   Overall Strength  Within functional limits for tasks performed        LYMPHEDEMA/ONCOLOGY QUESTIONNAIRE - 07/30/19 0939      Type   Cancer Type  Right breast cancer      Lymphedema Assessments   Lymphedema Assessments  Upper extremities      Right Upper Extremity Lymphedema   10 cm Proximal to Olecranon Process  34.9 cm    Olecranon Process  29.3 cm    10 cm Proximal to Ulnar Styloid Process  25.6 cm    Just Proximal to Ulnar Styloid Process  17.4 cm    Across Hand at [employer]  18.9 cm    At [place] of 2nd Digit  6.4  cm      Left Upper Extremity Lymphedema   10 cm Proximal to Olecranon Process  34.8 cm    Olecranon Process  29.5 cm    10 cm Proximal to Ulnar Styloid Process  24.2 cm    Just Proximal to Ulnar Styloid Process  17 cm    Across Hand at [employer]  18.4 cm    At [place] of 2nd Digit  6.2 cm          Quick Dash - 07/30/19 0001    Open a tight or new jar  No difficulty    Do heavy household chores (wash walls, wash floors)  No difficulty    Carry a shopping bag or briefcase  No difficulty    Wash your back  No difficulty    Use a knife to cut food  No difficulty    Recreational activities in which you take some force or impact through your arm, shoulder, or hand (golf, hammering, tennis)  No difficulty    During the past week, to what extent has your arm, shoulder or hand problem interfered with your normal social activities with family, friends, neighbors, or groups?  Not at all    During the past week, to what extent has your arm, shoulder or hand problem limited your work or other regular daily activities  Not at all    Arm, shoulder, or hand pain.  None    Tingling (pins and needles) in your arm, shoulder, or hand  None    Difficulty Sleeping  No difficulty    DASH Score  0 %        Objective measurements completed on examination: See above findings.       Patient was instructed today in a home exercise program today for post op shoulder range of motion. These included active assist shoulder flexion in sitting, scapular retraction, wall walking with shoulder abduction, and hands behind head external rotation.  She was encouraged to do these twice a day, holding 3 seconds and repeating 5 times when permitted by her physician.           PT Education -  07/30/19 6203    Education Details  Lymphedema risk reduction and post op shoulder ROM HEP    Person(s) Educated  Patient    Methods  Explanation;Demonstration;Handout    Comprehension  Returned  demonstration;Verbalized understanding          PT Long Term Goals - 07/30/19 1250      PT LONG TERM GOAL #1   Title  Patient will demonstrate she has regained full shoulder ROM and function post operatively compared to baselines.    Time  8    Period  Weeks    Status  New    Target Date  09/24/19      Breast Clinic Goals - 07/30/19 1249      Patient will be able to verbalize understanding of pertinent lymphedema risk reduction practices relevant to her diagnosis specifically related to skin care.   Time  1    Period  Days    Status  Achieved      Patient will be able to return demonstrate and/or verbalize understanding of the post-op home exercise program related to regaining shoulder range of motion.   Time  1    Period  Days    Status  Achieved      Patient will be able to verbalize understanding of the importance of attending the postoperative After Breast Cancer Class for further lymphedema risk reduction education and therapeutic exercise.   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 It measures 4.6 cm and is located in the upper inner quadrant. Her multidisciplinary medical team met prior to her assessments to determine a recommended treatment plan. She is planning to have neoadjuvant chemotherapy followed by a right lumpectomy and sentinel node biopsy and radiation.    Stability/Clinical Decision Making  Stable/Uncomplicated    Clinical Decision Making  Low    Rehab Potential  Excellent    PT Frequency  --   Eval and 1 f/u visit and L-Dex screenings every 3 months   PT Treatment/Interventions  ADLs/Self Care Home Management;Therapeutic exercise;Patient/family education    PT Next Visit Plan  Will reassess 3-4 weeks post op and then do L-Dex screenings every 3 months     PT Home Exercise Plan  Post op shoulder ROM HEP    Consulted and Agree with Plan of Care  Patient;Family member/caregiver    Family Member Consulted  Husband       Patient will benefit from skilled therapeutic intervention in order to improve the following deficits and impairments:  Postural dysfunction, Decreased range of motion, Decreased knowledge of precautions, Impaired UE functional use, Pain  Visit Diagnosis: Malignant neoplasm of upper-inner quadrant of right breast in female, estrogen receptor positive ([place]) - Plan: PT plan of care cert/re-cert  Abnormal posture - Plan: PT plan of care cert/re-cert   Patient will follow up at outpatient cancer rehab 3-4 weeks following surgery.  If the patient requires physical therapy at that time, a specific plan will be dictated and sent to the referring physician for approval. The patient was educated today on appropriate basic range of motion exercises to begin post operatively and the importance of attending the After Breast Cancer class following surgery.  Patient was educated today on lymphedema risk  reduction practices as it pertains to recommendations that will benefit the patient immediately following surgery.  She verbalized good understanding.    The patient was assessed using the L-Dex machine today to produce a lymphedema index baseline score. The patient will be reassessed on a regular basis (typically every 3 months) to obtain new L-Dex scores. If the score is > 6.5 points away from his/her baseline score indicating onset of subclinical lymphedema, it will be recommended to wear a compression garment for 4 weeks, 12 hours per day and then be reassessed. If the score continues to be > 6.5 points from baseline at reassessment, we will initiate lymphedema treatment. Assessing in this manner has a 95% rate of preventing clinically significant lymphedema.    Problem List Patient Active Problem List   Diagnosis Date Noted  . Malignant  neoplasm of upper-inner quadrant of right breast in female, estrogen receptor positive ([place]) 07/23/2019   Annia Friendly, PT 07/30/19 12:53 PM  [place], [place], [address] Phone: [phone]   Fax:  [phone]  Name: Debra [name] MRN: [id number] Date of Birth: October 26, 1960

## 2019-07-31 ENCOUNTER — Telehealth: Payer: Self-pay | Admitting: Oncology

## 2019-07-31 ENCOUNTER — Encounter (HOSPITAL_BASED_OUTPATIENT_CLINIC_OR_DEPARTMENT_OTHER): Payer: Self-pay | Admitting: Surgery

## 2019-07-31 ENCOUNTER — Other Ambulatory Visit: Payer: Self-pay | Admitting: *Deleted

## 2019-07-31 ENCOUNTER — Other Ambulatory Visit: Payer: Self-pay

## 2019-07-31 NOTE — Telephone Encounter (Signed)
Scheduled appts per 3/24 los. Left voicemail with next appt details. Advised pt to get a print out of appt calendar at next visit. Put in appt notes for pt to get calendar at next visit.

## 2019-08-01 NOTE — Progress Notes (Signed)
Pharmacist Chemotherapy Monitoring - Initial Assessment    Anticipated start date: 08/07/19   Regimen:  . Are orders appropriate based on the patient's diagnosis, regimen, and cycle? Yes . Does the plan date match the patient's scheduled date? Yes . Is the sequencing of drugs appropriate? Yes . Are the premedications appropriate for the patient's regimen? Yes . Prior Authorization for treatment is: Pending o If applicable, is the correct biosimilar selected based on the patient's insurance? not applicable  Organ Function and Labs: Marland Kitchen Are dose adjustments needed based on the patient's renal function, hepatic function, or hematologic function? No . Are appropriate labs ordered prior to the start of patient's treatment? Yes . Other organ system assessment, if indicated: anthracyclines: Echo/ MUGA . The following baseline labs, if indicated, have been ordered: N/A  Dose Assessment: . Are the drug doses appropriate? Yes . Are the following correct: o Drug concentrations Yes o IV fluid compatible with drug Yes o Administration routes Yes o Timing of therapy Yes . If applicable, does the patient have documented access for treatment and/or plans for port-a-cath placement? yes . If applicable, have lifetime cumulative doses been properly documented and assessed? not applicable Lifetime Dose Tracking  No doses have been documented on this patient for the following tracked chemicals: Doxorubicin, Epirubicin, Idarubicin, Daunorubicin, Mitoxantrone, Bleomycin, Oxaliplatin, Carboplatin, Liposomal Doxorubicin  o   Toxicity Monitoring/Prevention: . The patient has the following take home antiemetics prescribed: Prochlorperazine, Dexamethasone and Lorazepam . The patient has the following take home medications prescribed: N/A . Medication allergies and previous infusion related reactions, if applicable, have been reviewed and addressed. Yes . The patient's current medication list has been assessed for  drug-drug interactions with their chemotherapy regimen. no significant drug-drug interactions were identified on review.  Order Review: . Are the treatment plan orders signed? No . Is the patient scheduled to see a provider prior to their treatment? Yes  I verify that I have reviewed each item in the above checklist and answered each question accordingly.   Kennith Center, Pharm.D., CPP 08/01/2019@5 :02 PM

## 2019-08-02 ENCOUNTER — Other Ambulatory Visit (HOSPITAL_COMMUNITY)
Admission: RE | Admit: 2019-08-02 | Discharge: 2019-08-02 | Disposition: A | Discharge status: Home / Self Care | Payer: BC Managed Care – PPO | Source: Ambulatory Visit | Attending: Surgery | Admitting: Surgery

## 2019-08-02 DIAGNOSIS — Z20822 Contact with and (suspected) exposure to covid-19: Secondary | ICD-10-CM | POA: Diagnosis not present

## 2019-08-02 DIAGNOSIS — Z01812 Encounter for preprocedural laboratory examination: Secondary | ICD-10-CM | POA: Diagnosis not present

## 2019-08-02 LAB — SARS CORONAVIRUS 2 (TAT 6-24 HRS): SARS Coronavirus 2: NEGATIVE

## 2019-08-03 ENCOUNTER — Ambulatory Visit: Payer: BC Managed Care – PPO | Attending: Internal Medicine

## 2019-08-03 DIAGNOSIS — Z23 Encounter for immunization: Secondary | ICD-10-CM

## 2019-08-03 NOTE — Progress Notes (Signed)
   Covid-19 Vaccination Clinic  Name:  Debra [name]    MRN: [id number] DOB: 03/19/1961  08/03/2019  [name] was observed post Covid-19 immunization for 15 minutes without incident. She was provided with Vaccine Information Sheet and instruction to access the V-Safe system.   [name] was instructed to call 911 with any severe reactions post vaccine: Marland Kitchen Difficulty breathing  . Swelling of face and throat  . A fast heartbeat  . A bad rash all over body  . Dizziness and weakness   Immunizations Administered    Name Date Dose VIS Date Route   Pfizer COVID-19 Vaccine 08/03/2019  1:36 PM 0.3 mL 04/18/2019 Intramuscular   Manufacturer: [place]   Lot: [id number]   [place]: [id number]

## 2019-08-05 ENCOUNTER — Encounter: Payer: Self-pay | Admitting: *Deleted

## 2019-08-05 ENCOUNTER — Other Ambulatory Visit: Payer: BC Managed Care – PPO

## 2019-08-05 ENCOUNTER — Other Ambulatory Visit: Payer: Self-pay | Admitting: *Deleted

## 2019-08-05 ENCOUNTER — Other Ambulatory Visit: Payer: Self-pay

## 2019-08-05 ENCOUNTER — Telehealth: Payer: Self-pay | Admitting: Adult Health

## 2019-08-05 ENCOUNTER — Telehealth: Payer: Self-pay | Admitting: *Deleted

## 2019-08-05 ENCOUNTER — Ambulatory Visit (HOSPITAL_COMMUNITY)
Admission: RE | Admit: 2019-08-05 | Discharge: 2019-08-05 | Disposition: A | Discharge status: Home / Self Care | Payer: BC Managed Care – PPO | Source: Ambulatory Visit | Attending: Oncology | Admitting: Oncology

## 2019-08-05 ENCOUNTER — Inpatient Hospital Stay: Payer: BC Managed Care – PPO

## 2019-08-05 ENCOUNTER — Encounter: Payer: Self-pay | Admitting: Oncology

## 2019-08-05 DIAGNOSIS — Z01818 Encounter for other preprocedural examination: Secondary | ICD-10-CM | POA: Diagnosis not present

## 2019-08-05 DIAGNOSIS — C50211 Malignant neoplasm of upper-inner quadrant of right female breast: Secondary | ICD-10-CM | POA: Insufficient documentation

## 2019-08-05 DIAGNOSIS — Z170 Estrogen receptor positive status [ER+]: Secondary | ICD-10-CM | POA: Diagnosis not present

## 2019-08-05 NOTE — Progress Notes (Signed)
Called pt to introduce myself as her Secretary/administrator copay assistanceand the [employer]. Pt gave me consent to apply in her behalf so I completed theonlineapplicationw/ the[employer] foundation for [employer]. The app is pending so I will notify her of the outcome once I receive it.  I also informed her of the [employer], went over what it covers and gave her the income requirement.  Pt stated she exceeds the income so she doesn't qualify for the grant at this time.  I will give her my card at her next visit for any questions or concerns she may have in the future.

## 2019-08-05 NOTE — Telephone Encounter (Signed)
Peer to peer conducted with [facility] physician and received approval for Onpro for one dose as patient will be out of town.  [id number].  Good for one dose from 08/07/2019 through 09/11/2019.   Time spent: 50 minutes  Wilber Bihari, NP

## 2019-08-05 NOTE — Telephone Encounter (Signed)
Pt came in for edu class today- informed nurse that she has had a planned trip this coming weekend and would not be available to come in Saturday for Fulphila injection.  Per PA personal - above drug not on formulary and Peer to Peer would likely have to be done.  Note this RN discussed above with pt including rescheduling her treatment to next Tuesday. Reviewed with pt additional concern of receiving first chemo- and concern that she may experience side effects interfering with her plans over the weekend while out of town.  Pt states she would prefer to do treatment on Thursday as planned which would then allow her to recuperate next week which is her spring break.  She understands if Peer to Peer results in [place] being denied she would have to reschedule 1st treatment.  Peer to Peer done by [facility]/NP with 1 time approval obtained.  This RN informed pt of above.

## 2019-08-05 NOTE — Progress Notes (Signed)
  Echocardiogram 2D Echocardiogram has been performed.  Vernis Eid G Rukiya Hodgkins 08/05/2019, 12:10 PM

## 2019-08-05 NOTE — Telephone Encounter (Signed)
Spoke to pt concerning Ord from 3.24.21. Denies questions or concerns regarding dx or treatment care plan. Discussed with pt inability to move up breast MRI from 4/2. Pt will keep breast MRI on 4/1. Encourage pt to call with needs. Received verbal understanding. Contact information provided.

## 2019-08-05 NOTE — H&P (Signed)
Herma Mering Documented: 07/30/2019 7:20 AM Location: [facility] Patient #: [id number] DOB: Sep 13, 1960 Undefined / Language: Cleophus Molt / Race: White Female   History of Present Illness Nathaneil Canary A. Ninfa Linden MD; 07/30/2019 10:46 AM) The patient is a 59 year old female who presents with breast cancer. Chief complaint: Right breast mass  This is a 59 year old female presents with right breast mass. She is newly moved here from [place]. She underwent screening mammography and ultrasound. This demonstrated a large almost 5 cm mass in the right breast in the upper inner quadrant. She underwent a biopsy of this showing a poorly differentiated invasive ductal carcinoma. It was weakly ER positive. HER-2 and PR were negative. Her Ki-67 was 90%. She has no previous history of problems with her breast other than cyst. She has had no previous surgery on her breast. There is no family history of breast cancer. She is otherwise healthy without complaints.   Past Surgical History Tawni Pummel, RN; 07/30/2019 7:20 AM) Hysterectomy (not due to cancer) - Complete  Lung Surgery  Right.  Diagnostic Studies History Tawni Pummel, RN; 07/30/2019 7:20 AM) Colonoscopy  1-5 years ago Mammogram  within last year Pap Smear  >5 years ago  Medication History Tawni Pummel, RN; 07/30/2019 7:20 AM) Medications Reconciled  Social History Tawni Pummel, RN; 07/30/2019 7:20 AM) No alcohol use  No caffeine use  No drug use  Tobacco use  Never smoker.  Family History Tawni Pummel, RN; 07/30/2019 7:20 AM) Cancer  Son.  Pregnancy / Birth History Tawni Pummel, RN; 07/30/2019 7:20 AM) Age at menarche  25 years. Age of menopause  78-55 Gravida  3 Length (months) of breastfeeding  3-6 Maternal age  24-20 Para  3 Regular periods   Other Problems Tawni Pummel, RN; 07/30/2019 7:20 AM) Lump In Breast     Review of Systems Tawni Pummel RN; 07/30/2019 7:20  AM) General Not Present- Appetite Loss, Chills, Fatigue, Fever, Night Sweats, Weight Gain and Weight Loss. Skin Not Present- Change in Wart/Mole, Dryness, Hives, Jaundice, New Lesions, Non-Healing Wounds, Rash and Ulcer. HEENT Present- Wears glasses/contact lenses. Not Present- Earache, Hearing Loss, Hoarseness, Nose Bleed, Oral Ulcers, Ringing in the Ears, Seasonal Allergies, Sinus Pain, Sore Throat, Visual Disturbances and Yellow Eyes. Respiratory Not Present- Bloody sputum, Chronic Cough, Difficulty Breathing, Snoring and Wheezing. Breast Present- Breast Mass. Not Present- Breast Pain, Nipple Discharge and Skin Changes. Cardiovascular Not Present- Chest Pain, Difficulty Breathing Lying Down, Leg Cramps, Palpitations, Rapid Heart Rate, Shortness of Breath and Swelling of Extremities. Gastrointestinal Not Present- Abdominal Pain, Bloating, Bloody Stool, Change in Bowel Habits, Chronic diarrhea, Constipation, Difficulty Swallowing, Excessive gas, Gets full quickly at meals, Hemorrhoids, Indigestion, Nausea, Rectal Pain and Vomiting. Female Genitourinary Not Present- Frequency, Nocturia, Painful Urination, Pelvic Pain and Urgency. Musculoskeletal Not Present- Back Pain, Joint Pain, Joint Stiffness, Muscle Pain, Muscle Weakness and Swelling of Extremities. Neurological Not Present- Decreased Memory, Fainting, Headaches, Numbness, Seizures, Tingling, Tremor, Trouble walking and Weakness. Psychiatric Not Present- Anxiety, Bipolar, Change in Sleep Pattern, Depression, Fearful and Frequent crying. Endocrine Not Present- Cold Intolerance, Excessive Hunger, Hair Changes, Heat Intolerance, Hot flashes and New Diabetes. Hematology Not Present- Blood Thinners, Easy Bruising, Excessive bleeding, Gland problems, HIV and Persistent Infections.   Physical Exam (Dashawn Golda A. Ninfa Linden MD; 07/30/2019 10:47 AM) The physical exam findings are as follows: Note:She appears well physical examination  There is a large,  mobile mass in the upper inner quadrant of the right breast. It does not appear fixed. There are  no skin changes. There is mild ecchymosis from a biopsy. There is no axillary adenopathy or supraclavicular adenopathy.    Assessment & Plan (Aleksander Edmiston A. Ninfa Linden MD; 07/30/2019 10:51 AM) BREAST CANCER, RIGHT (C50.911) Impression: This is a patient with a large right breast cancer. She was discussed in a multidisciplinary clinic. I had a discussion with the patient and her husband regarding breast cancer. We discussed breast conservation versus mastectomy. This tumor is quite large and if she would like to undergo breast conservation and she would need neoadjuvant therapy with chemotherapy via Port-A-Cath to This is a patient with a large right breast cancer. She was discussed in a multidisciplinary clinic. I had a discussion with the patient and her husband regarding breast cancer. We discussed breast conservation versus mastectomy. This tumor is quite large and if she would like to undergo breast conservation and she would need neoadjuvant therapy with chemotherapy via Port-A-Cath to hopefully reduce the size of the tumor and make a lumpectomy and sentinel node possible versus a mastectomy. I had a long discussion regarding surgery with her. I discussed Port-A-Cath insertion including the risks. These risks include but are not limited to bleeding, infection, injury to surrounding structures, pneumothorax, the need for further procedures, etc. Again, the hopes will be to reduce the size of the tumor and make breast conservation surgery possible. Regardless of her decision regarding breast conservation versus mastectomy, she would need a Port-A-Cath. At this point, she wishes to go ahead and proceed with Port-A-Cath insertion and neoadjuvant therapy. Surgery will be scheduled for port insertion as soon as possible

## 2019-08-06 ENCOUNTER — Ambulatory Visit (HOSPITAL_BASED_OUTPATIENT_CLINIC_OR_DEPARTMENT_OTHER)
Admission: RE | Admit: 2019-08-06 | Discharge: 2019-08-06 | Disposition: A | Discharge status: Home / Self Care | Payer: BC Managed Care – PPO | Attending: Surgery | Admitting: Surgery

## 2019-08-06 ENCOUNTER — Ambulatory Visit (HOSPITAL_COMMUNITY): Payer: BC Managed Care – PPO

## 2019-08-06 ENCOUNTER — Encounter (HOSPITAL_BASED_OUTPATIENT_CLINIC_OR_DEPARTMENT_OTHER): Payer: Self-pay | Admitting: Surgery

## 2019-08-06 ENCOUNTER — Encounter (HOSPITAL_BASED_OUTPATIENT_CLINIC_OR_DEPARTMENT_OTHER)
Admission: RE | Disposition: A | Discharge status: Home / Self Care | Payer: Self-pay | Source: Home / Self Care | Attending: Surgery

## 2019-08-06 ENCOUNTER — Ambulatory Visit (HOSPITAL_BASED_OUTPATIENT_CLINIC_OR_DEPARTMENT_OTHER): Payer: BC Managed Care – PPO | Admitting: Anesthesiology

## 2019-08-06 ENCOUNTER — Other Ambulatory Visit: Payer: Self-pay

## 2019-08-06 DIAGNOSIS — C50211 Malignant neoplasm of upper-inner quadrant of right female breast: Secondary | ICD-10-CM | POA: Diagnosis not present

## 2019-08-06 DIAGNOSIS — Z170 Estrogen receptor positive status [ER+]: Secondary | ICD-10-CM | POA: Diagnosis not present

## 2019-08-06 DIAGNOSIS — Z452 Encounter for adjustment and management of vascular access device: Secondary | ICD-10-CM

## 2019-08-06 DIAGNOSIS — Z87891 Personal history of nicotine dependence: Secondary | ICD-10-CM | POA: Insufficient documentation

## 2019-08-06 DIAGNOSIS — Z419 Encounter for procedure for purposes other than remedying health state, unspecified: Secondary | ICD-10-CM

## 2019-08-06 DIAGNOSIS — C50911 Malignant neoplasm of unspecified site of right female breast: Secondary | ICD-10-CM | POA: Diagnosis present

## 2019-08-06 HISTORY — PX: PORTACATH PLACEMENT: SHX2246

## 2019-08-06 SURGERY — INSERTION, TUNNELED CENTRAL VENOUS DEVICE, WITH PORT
Anesthesia: General | Site: Chest | Laterality: Left

## 2019-08-06 MED ORDER — ACETAMINOPHEN 500 MG PO TABS
1000.0000 mg | ORAL_TABLET | ORAL | Status: AC
  Administered 2019-08-06: 1000 mg via ORAL

## 2019-08-06 MED ORDER — PROPOFOL 10 MG/ML IV BOLUS
INTRAVENOUS | Status: DC | PRN
  Administered 2019-08-06: 150 mg via INTRAVENOUS

## 2019-08-06 MED ORDER — FENTANYL CITRATE (PF) 100 MCG/2ML IJ SOLN: 25.0000 ug | INTRAMUSCULAR | Status: DC | PRN

## 2019-08-06 MED ORDER — PHENYLEPHRINE 40 MCG/ML (10ML) SYRINGE FOR IV PUSH (FOR BLOOD PRESSURE SUPPORT)
PREFILLED_SYRINGE | INTRAVENOUS | Status: AC
  Filled 2019-08-06: qty 10

## 2019-08-06 MED ORDER — GABAPENTIN 300 MG PO CAPS
ORAL_CAPSULE | ORAL | Status: AC
  Filled 2019-08-06: qty 1

## 2019-08-06 MED ORDER — OXYCODONE HCL 5 MG/5ML PO SOLN: 5.0000 mg | Freq: Once | ORAL | Status: DC | PRN

## 2019-08-06 MED ORDER — HEPARIN (PORCINE) IN NACL 2-0.9 UNITS/ML
INTRAMUSCULAR | Status: AC | PRN
  Administered 2019-08-06: 500 mL

## 2019-08-06 MED ORDER — CEFAZOLIN SODIUM-DEXTROSE 2-4 GM/100ML-% IV SOLN
INTRAVENOUS | Status: AC
  Filled 2019-08-06: qty 100

## 2019-08-06 MED ORDER — OXYCODONE HCL 5 MG PO TABS: 5.0000 mg | ORAL_TABLET | Freq: Once | ORAL | Status: DC | PRN

## 2019-08-06 MED ORDER — HEPARIN SOD (PORK) LOCK FLUSH 100 UNIT/ML IV SOLN
INTRAVENOUS | Status: DC | PRN
  Administered 2019-08-06: 500 [IU]

## 2019-08-06 MED ORDER — CEFAZOLIN SODIUM-DEXTROSE 2-4 GM/100ML-% IV SOLN
2.0000 g | INTRAVENOUS | Status: AC
  Administered 2019-08-06: 2 g via INTRAVENOUS

## 2019-08-06 MED ORDER — LIDOCAINE HCL (CARDIAC) PF 100 MG/5ML IV SOSY
PREFILLED_SYRINGE | INTRAVENOUS | Status: DC | PRN
  Administered 2019-08-06: 60 mg via INTRAVENOUS

## 2019-08-06 MED ORDER — SUCCINYLCHOLINE CHLORIDE 200 MG/10ML IV SOSY
PREFILLED_SYRINGE | INTRAVENOUS | Status: AC
  Filled 2019-08-06: qty 10

## 2019-08-06 MED ORDER — MIDAZOLAM HCL 2 MG/2ML IJ SOLN
INTRAMUSCULAR | Status: AC
  Filled 2019-08-06: qty 2

## 2019-08-06 MED ORDER — ONDANSETRON HCL 4 MG/2ML IJ SOLN: 4.0000 mg | Freq: Once | INTRAMUSCULAR | Status: DC | PRN

## 2019-08-06 MED ORDER — ONDANSETRON HCL 4 MG/2ML IJ SOLN
INTRAMUSCULAR | Status: AC
  Filled 2019-08-06: qty 2

## 2019-08-06 MED ORDER — DEXAMETHASONE SODIUM PHOSPHATE 4 MG/ML IJ SOLN
INTRAMUSCULAR | Status: DC | PRN
  Administered 2019-08-06: 5 mg via INTRAVENOUS

## 2019-08-06 MED ORDER — TRAMADOL HCL 50 MG PO TABS: 50.0000 mg | ORAL_TABLET | Freq: Four times a day (QID) | ORAL | 0 refills | Status: DC | PRN

## 2019-08-06 MED ORDER — CHLORHEXIDINE GLUCONATE CLOTH 2 % EX PADS: 6.0000 | MEDICATED_PAD | Freq: Once | CUTANEOUS | Status: DC

## 2019-08-06 MED ORDER — ACETAMINOPHEN 500 MG PO TABS
ORAL_TABLET | ORAL | Status: AC
  Filled 2019-08-06: qty 2

## 2019-08-06 MED ORDER — DEXAMETHASONE SODIUM PHOSPHATE 10 MG/ML IJ SOLN
INTRAMUSCULAR | Status: AC
  Filled 2019-08-06: qty 1

## 2019-08-06 MED ORDER — FENTANYL CITRATE (PF) 100 MCG/2ML IJ SOLN
INTRAMUSCULAR | Status: AC
  Filled 2019-08-06: qty 2

## 2019-08-06 MED ORDER — LACTATED RINGERS IV SOLN: INTRAVENOUS | Status: DC

## 2019-08-06 MED ORDER — FENTANYL CITRATE (PF) 100 MCG/2ML IJ SOLN
INTRAMUSCULAR | Status: DC | PRN
  Administered 2019-08-06: 100 ug via INTRAVENOUS

## 2019-08-06 MED ORDER — EPHEDRINE 5 MG/ML INJ
INTRAVENOUS | Status: AC
  Filled 2019-08-06: qty 10

## 2019-08-06 MED ORDER — BUPIVACAINE HCL (PF) 0.5 % IJ SOLN
INTRAMUSCULAR | Status: DC | PRN
  Administered 2019-08-06: 18 mL

## 2019-08-06 MED ORDER — ONDANSETRON HCL 4 MG/2ML IJ SOLN
INTRAMUSCULAR | Status: DC | PRN
  Administered 2019-08-06: 4 mg via INTRAVENOUS

## 2019-08-06 MED ORDER — GABAPENTIN 300 MG PO CAPS
300.0000 mg | ORAL_CAPSULE | ORAL | Status: AC
  Administered 2019-08-06: 300 mg via ORAL

## 2019-08-06 MED ORDER — MIDAZOLAM HCL 5 MG/5ML IJ SOLN
INTRAMUSCULAR | Status: DC | PRN
  Administered 2019-08-06: 2 mg via INTRAVENOUS

## 2019-08-06 MED ORDER — LIDOCAINE 2% (20 MG/ML) 5 ML SYRINGE
INTRAMUSCULAR | Status: AC
  Filled 2019-08-06: qty 5

## 2019-08-06 MED ORDER — PROPOFOL 500 MG/50ML IV EMUL
INTRAVENOUS | Status: AC
  Filled 2019-08-06: qty 50

## 2019-08-06 SURGICAL SUPPLY — 38 items
BAG DECANTER FOR FLEXI CONT (MISCELLANEOUS) ×2 IMPLANT
BLADE SURG 15 STRL LF DISP TIS (BLADE) ×1 IMPLANT
BLADE SURG 15 STRL SS (BLADE) ×1
CANISTER SUCT 1200ML W/VALVE (MISCELLANEOUS) IMPLANT
CHLORAPREP W/TINT 26 (MISCELLANEOUS) ×2 IMPLANT
COVER BACK TABLE 60X90IN (DRAPES) ×2 IMPLANT
COVER MAYO STAND STRL (DRAPES) ×2 IMPLANT
COVER WAND RF STERILE (DRAPES) IMPLANT
DECANTER SPIKE VIAL GLASS SM (MISCELLANEOUS) IMPLANT
DERMABOND ADVANCED (GAUZE/BANDAGES/DRESSINGS) ×2
DERMABOND ADVANCED .7 DNX12 (GAUZE/BANDAGES/DRESSINGS) ×2 IMPLANT
DRAPE C-ARM 42X72 X-RAY (DRAPES) ×2 IMPLANT
DRAPE LAPAROSCOPIC ABDOMINAL (DRAPES) ×2 IMPLANT
DRAPE UTILITY XL STRL (DRAPES) ×2 IMPLANT
ELECT REM PT RETURN 9FT ADLT (ELECTROSURGICAL) ×2
ELECTRODE REM PT RTRN 9FT ADLT (ELECTROSURGICAL) ×1 IMPLANT
GLOVE SURG SIGNA 7.5 PF LTX (GLOVE) ×2 IMPLANT
GOWN STRL REUS W/ TWL LRG LVL3 (GOWN DISPOSABLE) ×1 IMPLANT
GOWN STRL REUS W/ TWL XL LVL3 (GOWN DISPOSABLE) ×1 IMPLANT
GOWN STRL REUS W/TWL LRG LVL3 (GOWN DISPOSABLE) ×1
GOWN STRL REUS W/TWL XL LVL3 (GOWN DISPOSABLE) ×1
IV KIT MINILOC 20X1 SAFETY (NEEDLE) IMPLANT
KIT PORT POWER 8FR ISP CVUE (Port) ×2 IMPLANT
KIT PORT POWER ISP 8FR (Stent) IMPLANT
NEEDLE HYPO 25X1 1.5 SAFETY (NEEDLE) ×2 IMPLANT
PACK BASIN DAY SURGERY FS (CUSTOM PROCEDURE TRAY) ×2 IMPLANT
PENCIL SMOKE EVACUATOR (MISCELLANEOUS) ×2 IMPLANT
SLEEVE SCD COMPRESS KNEE MED (MISCELLANEOUS) ×2 IMPLANT
SUT MNCRL AB 4-0 PS2 18 (SUTURE) ×2 IMPLANT
SUT PROLENE 2 0 SH DA (SUTURE) ×2 IMPLANT
SUT SILK 2 0 TIES 17X18 (SUTURE)
SUT SILK 2-0 18XBRD TIE BLK (SUTURE) IMPLANT
SUT VIC AB 3-0 SH 27 (SUTURE) ×1
SUT VIC AB 3-0 SH 27X BRD (SUTURE) ×1 IMPLANT
SYR CONTROL 10ML LL (SYRINGE) ×2 IMPLANT
TOWEL GREEN STERILE FF (TOWEL DISPOSABLE) ×2 IMPLANT
TUBE CONNECTING 20X1/4 (TUBING) IMPLANT
YANKAUER SUCT BULB TIP NO VENT (SUCTIONS) IMPLANT

## 2019-08-06 NOTE — Progress Notes (Addendum)
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 She was evaluated in the multidisciplinary breast cancer clinic on 07/30/2019.  Since consultation, she underwent echocardiogram on 08/05/2019 showing an ejection fraction of 60-65%.  She also underwent port placement yesterday, 08/06/2019, under Dr. Ninfa Linden.  She tolerated this generally well, and is taking some Tylenol and Motrin for discomfort.  She is scheduled for breast MRI tomorrow, 08/08/2019.  She will return here to have OnPro placed right after the procedure   REVIEW OF SYSTEMS: [name] met with the chemotherapy teaching nurse and was very pleased with the amount of information she received.  She is planning to drive to [place] with her husband for the [place] weekend.  They will be leaving 08/08/2019 and returning 08/11/2019.  A detailed review of systems today was otherwise entirely stable   HISTORY OF CURRENT ILLNESS: From the original intake note:  Trinda herself palpated a mass in the upper-inner right breast in February 2021.  It appeared to increase in size over the next 3 weeks. Physical exam performed at [facility] 07/15/2019 confirmed a 6 mm firm, oval, palpable mass in the upper-inner right breast. She  underwent bilateral diagnostic mammography with tomography and bilateral breast ultrasonography on 07/15/2019 showing: breast density category C; 4.6 cm mass in the right breast at 1 o'clock; single abnormal-appearing lymph node; bilateral benign cysts.  Accordingly on 07/21/2019 she proceeded to biopsy of the right breast area in question. 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 Prognostic indicators significant for: estrogen receptor, 60% positive with weak staining intensity and progesterone receptor, 0% negative. Proliferation marker Ki67 at 90%. HER2 equivocal by immunohistochemistry (2+), but negative by fluorescent in situ hybridization with a signals ratio 1.24 and number per cell 2.55.  The questionable right axillary lymph node was biopsied as well and was benign (concordant).  The patient's subsequent history is as detailed below.   PAST MEDICAL HISTORY: No past medical history on file.   PAST SURGICAL HISTORY: Past Surgical History:  Procedure Laterality Date  . ABDOMINAL HYSTERECTOMY    . LUNG BIOPSY    Status post bilateral salpingo-oophorectomy   FAMILY HISTORY: Family History  Problem Relation Age of Onset  . Skin cancer Sister   . Colon cancer Paternal Grandfather   . Wilm's tumor Son     Her father is 49 years old as of 07/2019. Her mother was murdered at age 41 (domestic violence).  The patient has 3 sisters and no brothers. She reports a third cousin (grandmother's sister's daughter) with breast cancer in her 71's. She denies a family history of ovarian, prostate, or pancreatic cancer. She does report colon cancer in her paternal grandfather in his 18's, non-melanoma skin cancer in her sister in her 61's, and Wilms tumor in her son at 68 months.   GYNECOLOGIC HISTORY:  Patient's last menstrual period was 05/09/2011. Menarche: [age] years old Age at first live birth: 59  years old [place] P 3 LMP 2013 Contraceptive never used HRT used for approximately 9 months  Hysterectomy? Yes, 2013 BSO? 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 Husband [name] is an Art gallery manager. She lives at home with husband [name]. Daughter [name], age 72, is a poet and Pharmacist, hospital in [place], [place]. Son [name], age 101, is an Production designer, theatre/television/film in [place] in [place], [place]. Son Lithzy Bernard, age 5, is a Marketing executive working in Artist in [place], [place] (at the [employer]). Lianah has three grandchildren. She is a Media planner.    ADVANCED DIRECTIVES: In the absence of any documentation to the contrary, the patient's spouse is their HCPOA.    HEALTH MAINTENANCE: Social History   Tobacco Use  . Smoking status: Former Research scientist (life sciences)  . Smokeless tobacco: Never Used  Substance Use Topics  . Alcohol use: Yes  . Drug use: Never     Colonoscopy: none on file  PAP: none on file (s/p hysterectomy)  Bone density: n/a (age)   No Known Allergies  Current Outpatient Medications  Medication Sig Dispense Refill  . dexamethasone (DECADRON) 4 MG tablet Take 2 tablets by mouth daily starting the day after Carboplatin and Cytoxan x 3 days. Take with food. 30 tablet 1  . lidocaine-prilocaine (EMLA) cream Apply to affected area once 30 g 3  . loratadine (CLARITIN) 10 MG tablet Take 1 tablet (10 mg total) by mouth daily. 60 tablet 0  . LORazepam (ATIVAN) 0.5 MG tablet Take 1 tablet (0.5 mg total) by mouth at bedtime as needed (Nausea or vomiting). 30 tablet 0  . Multiple Vitamin (MULTI-VITAMIN DAILY PO) Take by mouth.    . prochlorperazine (COMPAZINE) 10 MG tablet Take 1 tablet (10 mg total) by mouth every 6 (six) hours as needed (Nausea or vomiting). 30 tablet 1  . traMADol (ULTRAM) 50 MG tablet Take 1 tablet (50 mg total) by mouth every 6 (six) hours as needed for moderate pain or severe  pain. 20 tablet 0  . UNABLE TO FIND Omega Krill Oil     No current facility-administered medications for this visit.    OBJECTIVE: White woman w in no acute distress  Vitals:   08/07/19 0838  BP: 140/73  Pulse: 99  Resp: 18  Temp: 97.8 F (36.6 C)  SpO2: 99%     Body mass index is 29.94 kg/m.   Wt Readings from Last 3 Encounters:  08/07/19 205 lb 11.2 oz (93.3 kg)  08/06/19 202 lb 9.6 oz (91.9 kg)  07/30/19 206 lb 14.4 oz (93.8 kg)      ECOG FS:1 - Symptomatic but completely ambulatory  Sclerae unicteric, EOMs intact Wearing a mask No cervical or supraclavicular adenopathy Lungs no rales or rhonchi Heart regular rate and rhythm Abd soft, nontender, positive bowel sounds MSK no focal spinal tenderness, no upper extremity lymphedema Neuro: nonfocal, well oriented, appropriate affect Breasts: In the right breast upper inner quadrant, medially, there is an easily palpable mass measuring approximately 3 cm with no associated skin or nipple involvement.  The left breast and both axillae are benign.   LAB RESULTS:  CMP     Component Value Date/Time   NA 142 07/30/2019 0825   K 4.0 07/30/2019 0825   CL 110 07/30/2019 0825   CO2 25 07/30/2019 0825   GLUCOSE  88 07/30/2019 0825   BUN 11 07/30/2019 0825   CREATININE 0.76 07/30/2019 0825   CALCIUM 9.6 07/30/2019 0825   PROT 7.1 07/30/2019 0825   ALBUMIN 4.0 07/30/2019 0825   AST 14 (L) 07/30/2019 0825   ALT 11 07/30/2019 0825   ALKPHOS 88 07/30/2019 0825   BILITOT 0.4 07/30/2019 0825   GFRNONAA >60 07/30/2019 0825   GFRAA >60 07/30/2019 0825    No results found for: TOTALPROTELP, ALBUMINELP, A1GS, A2GS, BETS, BETA2SER, GAMS, MSPIKE, SPEI  Lab Results  Component Value Date   WBC 9.5 08/07/2019   NEUTROABS 5.7 08/07/2019   HGB 12.0 08/07/2019   HCT 37.0 08/07/2019   MCV 89.8 08/07/2019   PLT 271 08/07/2019    No results found for: LABCA2  No components found for: [id number]  No results for input(s): INR in  the last 168 hours.  No results found for: LABCA2  No results found for: ASN053  No results found for: ZJQ734  No results found for: LPF790  No results found for: CA2729  No components found for: HGQUANT  No results found for: CEA1 / No results found for: CEA1   No results found for: AFPTUMOR  No results found for: CHROMOGRNA  No results found for: KPAFRELGTCHN, LAMBDASER, KAPLAMBRATIO (kappa/lambda light chains)  No results found for: HGBA, HGBA2QUANT, HGBFQUANT, HGBSQUAN (Hemoglobinopathy evaluation)   No results found for: LDH  No results found for: IRON, TIBC, IRONPCTSAT (Iron and TIBC)  No results found for: FERRITIN  Urinalysis No results found for: COLORURINE, APPEARANCEUR, LABSPEC, PHURINE, GLUCOSEU, HGBUR, BILIRUBINUR, KETONESUR, PROTEINUR, UROBILINOGEN, NITRITE, LEUKOCYTESUR   STUDIES: DG Chest Port 1 View  Result Date: 08/06/2019 CLINICAL DATA:  Port-A-Cath placement.  History of breast carcinoma EXAM: PORTABLE CHEST 1 VIEW COMPARISON:  None. FINDINGS: Port-A-Cath tip is at the cavoatrial junction. No pneumothorax. Lungs are clear. Heart size and pulmonary vascularity are normal. No adenopathy. No bone lesions. IMPRESSION: Port-A-Cath tip at cavoatrial junction. No pneumothorax. Lungs clear. Cardiac silhouette within normal limits. Electronically Signed   By: Lowella Grip III M.D.   On: 08/06/2019 09:09   DG Fluoro Guide CV Line-No Report  Result Date: 08/06/2019 Fluoroscopy was utilized by the requesting physician.  No radiographic interpretation.   US BREAST LTD UNI LEFT INC AXILLA  Result Date: 07/15/2019 CLINICAL DATA:  Mass felt by the patient in the upper inner right breast for the past 3 weeks, increased in size during that time. EXAM: DIGITAL DIAGNOSTIC BILATERAL MAMMOGRAM WITH CAD AND TOMO ULTRASOUND BILATERAL BREAST COMPARISON:  Previous examinations in [place], the most recent dated 09/11/2016. ACR Breast Density Category c: The breast tissue is  heterogeneously dense, which may obscure small masses. FINDINGS: There is an interval irregular mass with some circumscribed and some indistinct margins in the upper inner right breast, corresponding to the mass felt by the patient, marked with a metallic marker. Multiple interval small, rounded and oval, circumscribed masses are demonstrated in the upper outer and upper inner quadrants of the left breast. There are no abnormal appearing axillary lymph nodes on either side. Mammographic images were processed with CAD. On physical exam, there is an approximately 6 x 5 cm firm, oval palpable mass in the upper inner quadrant of the right breast. There are no palpable axillary nodes on the right. Targeted ultrasound is performed, showing an irregular, heterogeneous, predominantly hypoechoic mass in 1 o'clock position of the right breast, 5 cm from the nipple, corresponding to the palpable mass. This measures 4.6 x 3.5 x  1.9 cm. There is some surrounding ill-defined increased echogenicity. There is a nearby 5 mm cyst containing low-level internal echoes in the 12:30 o'clock position of the right breast, 3 cm from the nipple. This appeared more clearly cystic at real-time with harmonics. Ultrasound of the right axilla demonstrated a single right axillary lymph node with eccentric cortical thickening measuring 3.6 mm in maximum thickness. The remainder of the right axillary lymph nodes have normal appearances. Ultrasound of the left breast demonstrates multiple cysts. The largest is a bilobed cyst with some internal debris in the 1 o'clock retroareolar region. This corresponds to the largest mammographic mass. No solid masses were seen. IMPRESSION: 1. 4.6 cm mass in the 1 o'clock position of the right breast with imaging features highly suspicious for malignancy. 2. Single abnormal appearing right axillary lymph node suspicious for a metastatic node. 3. Bilateral benign breast cysts. RECOMMENDATION: Ultrasound-guided core  needle biopsy of the 4.6 cm mass in the 1 o'clock of the right breast and ultrasound-guided core needle biopsy of the abnormal appearing right axillary lymph node. This has been discussed with the patient and the biopsies have been scheduled at 1:45 p.m. on 07/21/2019. I have discussed the findings and recommendations with the patient. If applicable, a reminder letter will be sent to the patient regarding the next appointment. BI-RADS CATEGORY  5: Highly suggestive of malignancy. Electronically Signed   By: Claudie Revering M.D.   On: 07/15/2019 16:04   US BREAST LTD UNI RIGHT INC AXILLA  Result Date: 07/15/2019 CLINICAL DATA:  Mass felt by the patient in the upper inner right breast for the past 3 weeks, increased in size during that time. EXAM: DIGITAL DIAGNOSTIC BILATERAL MAMMOGRAM WITH CAD AND TOMO ULTRASOUND BILATERAL BREAST COMPARISON:  Previous examinations in [place], the most recent dated 09/11/2016. ACR Breast Density Category c: The breast tissue is heterogeneously dense, which may obscure small masses. FINDINGS: There is an interval irregular mass with some circumscribed and some indistinct margins in the upper inner right breast, corresponding to the mass felt by the patient, marked with a metallic marker. Multiple interval small, rounded and oval, circumscribed masses are demonstrated in the upper outer and upper inner quadrants of the left breast. There are no abnormal appearing axillary lymph nodes on either side. Mammographic images were processed with CAD. On physical exam, there is an approximately 6 x 5 cm firm, oval palpable mass in the upper inner quadrant of the right breast. There are no palpable axillary nodes on the right. Targeted ultrasound is performed, showing an irregular, heterogeneous, predominantly hypoechoic mass in 1 o'clock position of the right breast, 5 cm from the nipple, corresponding to the palpable mass. This measures 4.6 x 3.5 x 1.9 cm. There is some surrounding ill-defined  increased echogenicity. There is a nearby 5 mm cyst containing low-level internal echoes in the 12:30 o'clock position of the right breast, 3 cm from the nipple. This appeared more clearly cystic at real-time with harmonics. Ultrasound of the right axilla demonstrated a single right axillary lymph node with eccentric cortical thickening measuring 3.6 mm in maximum thickness. The remainder of the right axillary lymph nodes have normal appearances. Ultrasound of the left breast demonstrates multiple cysts. The largest is a bilobed cyst with some internal debris in the 1 o'clock retroareolar region. This corresponds to the largest mammographic mass. No solid masses were seen. IMPRESSION: 1. 4.6 cm mass in the 1 o'clock position of the right breast with imaging features highly suspicious for malignancy. 2.  Single abnormal appearing right axillary lymph node suspicious for a metastatic node. 3. Bilateral benign breast cysts. RECOMMENDATION: Ultrasound-guided core needle biopsy of the 4.6 cm mass in the 1 o'clock of the right breast and ultrasound-guided core needle biopsy of the abnormal appearing right axillary lymph node. This has been discussed with the patient and the biopsies have been scheduled at 1:45 p.m. on 07/21/2019. I have discussed the findings and recommendations with the patient. If applicable, a reminder letter will be sent to the patient regarding the next appointment. BI-RADS CATEGORY  5: Highly suggestive of malignancy. Electronically Signed   By: Claudie Revering M.D.   On: 07/15/2019 16:04   MM DIAG BREAST TOMO BILATERAL  Result Date: 07/15/2019 CLINICAL DATA:  Mass felt by the patient in the upper inner right breast for the past 3 weeks, increased in size during that time. EXAM: DIGITAL DIAGNOSTIC BILATERAL MAMMOGRAM WITH CAD AND TOMO ULTRASOUND BILATERAL BREAST COMPARISON:  Previous examinations in [place], the most recent dated 09/11/2016. ACR Breast Density Category c: The breast tissue is  heterogeneously dense, which may obscure small masses. FINDINGS: There is an interval irregular mass with some circumscribed and some indistinct margins in the upper inner right breast, corresponding to the mass felt by the patient, marked with a metallic marker. Multiple interval small, rounded and oval, circumscribed masses are demonstrated in the upper outer and upper inner quadrants of the left breast. There are no abnormal appearing axillary lymph nodes on either side. Mammographic images were processed with CAD. On physical exam, there is an approximately 6 x 5 cm firm, oval palpable mass in the upper inner quadrant of the right breast. There are no palpable axillary nodes on the right. Targeted ultrasound is performed, showing an irregular, heterogeneous, predominantly hypoechoic mass in 1 o'clock position of the right breast, 5 cm from the nipple, corresponding to the palpable mass. This measures 4.6 x 3.5 x 1.9 cm. There is some surrounding ill-defined increased echogenicity. There is a nearby 5 mm cyst containing low-level internal echoes in the 12:30 o'clock position of the right breast, 3 cm from the nipple. This appeared more clearly cystic at real-time with harmonics. Ultrasound of the right axilla demonstrated a single right axillary lymph node with eccentric cortical thickening measuring 3.6 mm in maximum thickness. The remainder of the right axillary lymph nodes have normal appearances. Ultrasound of the left breast demonstrates multiple cysts. The largest is a bilobed cyst with some internal debris in the 1 o'clock retroareolar region. This corresponds to the largest mammographic mass. No solid masses were seen. IMPRESSION: 1. 4.6 cm mass in the 1 o'clock position of the right breast with imaging features highly suspicious for malignancy. 2. Single abnormal appearing right axillary lymph node suspicious for a metastatic node. 3. Bilateral benign breast cysts. RECOMMENDATION: Ultrasound-guided core  needle biopsy of the 4.6 cm mass in the 1 o'clock of the right breast and ultrasound-guided core needle biopsy of the abnormal appearing right axillary lymph node. This has been discussed with the patient and the biopsies have been scheduled at 1:45 p.m. on 07/21/2019. I have discussed the findings and recommendations with the patient. If applicable, a reminder letter will be sent to the patient regarding the next appointment. BI-RADS CATEGORY  5: Highly suggestive of malignancy. Electronically Signed   By: Claudie Revering M.D.   On: 07/15/2019 16:04   ECHOCARDIOGRAM COMPLETE  Result Date: 08/05/2019    ECHOCARDIOGRAM REPORT   Patient Name:   Megahn Eichorn Date of  Exam: 08/05/2019 Medical Rec #:  [id number]       Height:       68.0 in Accession #:    [id number]      Weight:       200.0 lb Date of Birth:  27-Dec-1960       BSA:          2.044 m Patient Age:    71 years        BP:           119/76 mmHg Patient Gender: F               HR:           70 bpm. Exam Location:  Outpatient Procedure: 2D Echo, Cardiac Doppler, Color Doppler and Strain Analysis Indications:    Z51.11 Encounter for antineoplastic chemotheraphy  History:        Patient has no prior history of Echocardiogram examinations.  Sonographer:    Jonelle Sidle Dance Referring Phys: [place]  1. Left ventricular ejection fraction, by estimation, is 60 to 65%. The left ventricle has normal function. Left ventricular endocardial border not optimally defined to evaluate regional wall motion. Left ventricular diastolic parameters were normal.  2. Right ventricular systolic function is normal. The right ventricular size is normal. Tricuspid regurgitation signal is inadequate for assessing PA pressure.  3. The mitral valve is normal in structure. Trivial mitral valve regurgitation. No evidence of mitral stenosis.  4. The aortic valve is tricuspid. Aortic valve regurgitation is not visualized. No aortic stenosis is present.  5. The inferior  vena cava is normal in size with greater than 50% respiratory variability, suggesting right atrial pressure of 3 mmHg. Comparison(s): No prior Echocardiogram. Conclusion(s)/Recommendation(s): Normal biventricular function without evidence of hemodynamically significant valvular heart disease. FINDINGS  Left Ventricle: Left ventricular ejection fraction, by estimation, is 60 to 65%. The left ventricle has normal function. Left ventricular endocardial border not optimally defined to evaluate regional wall motion. The left ventricular internal cavity size was normal in size. There is no left ventricular hypertrophy. Left ventricular diastolic parameters were normal. Right Ventricle: The right ventricular size is normal. No increase in right ventricular wall thickness. Right ventricular systolic function is normal. Tricuspid regurgitation signal is inadequate for assessing PA pressure. Left Atrium: Left atrial size was normal in size. Right Atrium: Right atrial size was normal in size. Pericardium: Trivial pericardial effusion is present. Mitral Valve: The mitral valve is normal in structure. Trivial mitral valve regurgitation. No evidence of mitral valve stenosis. Tricuspid Valve: The tricuspid valve is normal in structure. Tricuspid valve regurgitation is trivial. No evidence of tricuspid stenosis. Aortic Valve: The aortic valve is tricuspid. Aortic valve regurgitation is not visualized. No aortic stenosis is present. Pulmonic Valve: The pulmonic valve was grossly normal. Pulmonic valve regurgitation is not visualized. No evidence of pulmonic stenosis. Aorta: The aortic root, ascending aorta and aortic arch are all structurally normal, with no evidence of dilitation or obstruction. Pulmonary Artery: The pulmonary artery is not well seen. Venous: The inferior vena cava is normal in size with greater than 50% respiratory variability, suggesting right atrial pressure of 3 mmHg. IAS/Shunts: No atrial level shunt detected  by color flow Doppler.  LEFT VENTRICLE PLAX 2D LVIDd:         4.80 cm  Diastology LVIDs:         3.00 cm  LV e' lateral:   9.68 cm/s LV PW:  1.00 cm  LV E/e' lateral: 10.6 LV IVS:        1.00 cm  LV e' medial:    8.81 cm/s LVOT diam:     2.10 cm  LV E/e' medial:  11.7 LV SV:         84 LV SV Index:   41       2D Longitudinal Strain LVOT Area:     3.46 cm 2D Strain GLS (A2C):   -21.1 %                         2D Strain GLS (A3C):   -16.7 %                         2D Strain GLS (A4C):   -17.9 %                         2D Strain GLS Avg:     -18.6 % RIGHT VENTRICLE             IVC RV Basal diam:  3.50 cm     IVC diam: 1.70 cm RV Mid diam:    2.20 cm RV S prime:     12.90 cm/s TAPSE (M-mode): 2.5 cm LEFT ATRIUM             Index       RIGHT ATRIUM           Index LA diam:        3.60 cm 1.76 cm/m  RA Area:     19.20 cm LA Vol (A2C):   71.0 ml 34.74 ml/m RA Volume:   53.70 ml  26.28 ml/m LA Vol (A4C):   49.3 ml 24.12 ml/m LA Biplane Vol: 61.2 ml 29.95 ml/m  AORTIC VALVE LVOT Vmax:   110.00 cm/s LVOT Vmean:  70.100 cm/s LVOT VTI:    0.243 m  AORTA Ao Root diam: 3.50 cm Ao Asc diam:  3.40 cm MITRAL VALVE MV Area (PHT): 3.91 cm     SHUNTS MV Decel Time: 194 msec     Systemic VTI:  0.24 m MV E velocity: 103.00 cm/s  Systemic Diam: 2.10 cm MV A velocity: 86.60 cm/s MV E/A ratio:  1.19 Buford Dresser MD Electronically signed by Buford Dresser MD Signature Date/Time: 08/05/2019/6:51:29 PM    Final    [place] AXILLARY NODE CORE BIOPSY RIGHT  Addendum Date: 07/23/2019   ADDENDUM REPORT: 07/22/2019 15:04 ADDENDUM: Pathology revealed POORLY DIFFERENTIATED GRADE III INVASIVE DUCTAL CARCINOMA WITH METAPLASTIC FEATURES of the RIGHT breast, 1 o'clock. This was found to be concordant by Dr. Dorise Bullion. Pathology revealed BENIGN LYMPH NODE of the RIGHT axilla. This was found to be concordant by Dr. Dorise Bullion. Pathology results were discussed with the patient by telephone. The patient reported doing  well after the biopsies with tenderness at the sites. Post biopsy instructions and care were reviewed and questions were answered. The patient was encouraged to call [facility] for any additional concerns. The patient was referred to [facility] on July 30, 2019. Pathology results reported by Stacie Acres RN on 07/22/2019. Electronically Signed   By: Dorise Bullion III M.D   On: 07/22/2019 15:04   Result Date: 07/23/2019 CLINICAL DATA:  Biopsy of a right breast mass and a right axillary lymph node.  EXAM: ULTRASOUND GUIDED RIGHT BREAST CORE NEEDLE BIOPSY COMPARISON:  Previous exam(s). PROCEDURE: I met with the patient and we discussed the procedure of ultrasound-guided biopsy, including benefits and alternatives. We discussed the high likelihood of a successful procedure. We discussed the risks of the procedure, including infection, bleeding, tissue injury, clip migration, and inadequate sampling. Informed written consent was given. The usual time-out protocol was performed immediately prior to the procedure. Lesion quadrant: 1 o'clock Using sterile technique and 1% Lidocaine as local anesthetic, under direct ultrasound visualization, a 12 gauge spring-loaded device was used to perform biopsy of a 1 o'clock right breast mass using a lateral approach. At the conclusion of the procedure tissue marker clip was deployed into the biopsy cavity. Follow up 2 view mammogram was performed and dictated separately. I met with the patient and we discussed the procedure of ultrasound-guided biopsy, including benefits and alternatives. We discussed the high likelihood of a successful procedure. We discussed the risks of the procedure, including infection, bleeding, tissue injury, clip migration, and inadequate sampling. Informed written consent was given. The usual time-out protocol was performed immediately prior to the  procedure. Lesion quadrant: A right axillary lymph node Using sterile technique and 1% Lidocaine as local anesthetic, under direct ultrasound visualization, a 14 gauge spring-loaded device was used to perform biopsy of right axillary lymph node using a lateral approach. At the conclusion of the procedure a HydroMARK tissue marker clip was deployed into the biopsy cavity. Follow up 2 view mammogram was performed and dictated separately. IMPRESSION: Ultrasound guided biopsy of a right breast mass and a right axillary lymph node. No apparent complications. Electronically Signed: By: Dorise Bullion III M.D On: 07/21/2019 14:27   MM CLIP PLACEMENT RIGHT  Result Date: 07/21/2019 CLINICAL DATA:  Evaluate biopsy marker EXAM: DIAGNOSTIC RIGHT MAMMOGRAM POST ULTRASOUND BIOPSY COMPARISON:  Previous exam(s). FINDINGS: Mammographic images were obtained following ultrasound guided biopsy of a right breast mass and a right axillary lymph node. The biopsy marking clip is in expected position at the site of biopsy. IMPRESSION: A ribbon shaped clip is within the biopsied right breast mass. A HydroMARK clip is within a right axillary lymph node. Final Assessment: Post Procedure Mammograms for Marker Placement Electronically Signed   By: Dorise Bullion III M.D   On: 07/21/2019 14:28   [place] RT BREAST BX W LOC DEV 1ST LESION IMG BX SPEC US GUIDE  Addendum Date: 07/23/2019   ADDENDUM REPORT: 07/22/2019 15:04 ADDENDUM: Pathology revealed POORLY DIFFERENTIATED GRADE III INVASIVE DUCTAL CARCINOMA WITH METAPLASTIC FEATURES of the RIGHT breast, 1 o'clock. This was found to be concordant by Dr. Dorise Bullion. Pathology revealed BENIGN LYMPH NODE of the RIGHT axilla. This was found to be concordant by Dr. Dorise Bullion. Pathology results were discussed with the patient by telephone. The patient reported doing well after the biopsies with tenderness at the sites. Post biopsy instructions and care were reviewed and questions were  answered. The patient was encouraged to call [facility] for any additional concerns. The patient was referred to [facility] on July 30, 2019. Pathology results reported by Stacie Acres RN on 07/22/2019. Electronically Signed   By: Dorise Bullion III M.D   On: 07/22/2019 15:04   Result Date: 07/23/2019 CLINICAL DATA:  Biopsy of a right breast mass and a right axillary lymph node. EXAM: ULTRASOUND GUIDED RIGHT BREAST CORE NEEDLE BIOPSY COMPARISON:  Previous exam(s). PROCEDURE: I met with  the patient and we discussed the procedure of ultrasound-guided biopsy, including benefits and alternatives. We discussed the high likelihood of a successful procedure. We discussed the risks of the procedure, including infection, bleeding, tissue injury, clip migration, and inadequate sampling. Informed written consent was given. The usual time-out protocol was performed immediately prior to the procedure. Lesion quadrant: 1 o'clock Using sterile technique and 1% Lidocaine as local anesthetic, under direct ultrasound visualization, a 12 gauge spring-loaded device was used to perform biopsy of a 1 o'clock right breast mass using a lateral approach. At the conclusion of the procedure tissue marker clip was deployed into the biopsy cavity. Follow up 2 view mammogram was performed and dictated separately. I met with the patient and we discussed the procedure of ultrasound-guided biopsy, including benefits and alternatives. We discussed the high likelihood of a successful procedure. We discussed the risks of the procedure, including infection, bleeding, tissue injury, clip migration, and inadequate sampling. Informed written consent was given. The usual time-out protocol was performed immediately prior to the procedure. Lesion quadrant: A right axillary lymph node Using sterile technique and 1% Lidocaine as local anesthetic, under  direct ultrasound visualization, a 14 gauge spring-loaded device was used to perform biopsy of right axillary lymph node using a lateral approach. At the conclusion of the procedure a HydroMARK tissue marker clip was deployed into the biopsy cavity. Follow up 2 view mammogram was performed and dictated separately. IMPRESSION: Ultrasound guided biopsy of a right breast mass and a right axillary lymph node. No apparent complications. Electronically Signed: By: Dorise Bullion III M.D On: 07/21/2019 14:27     ELIGIBLE FOR AVAILABLE RESEARCH PROTOCOL: AET  ASSESSMENT: 59 y.o. 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  (2) definitive surgery to follow  (3) adjuvant radiation to follow-up surgery as appropriate   PLAN: Rindi is ready to proceed to her first dose of chemotherapy today.  She has a good understanding of the possible toxicities side effects and complications.  Today I reviewed with her the routing sheet so she knows exactly how to take her supportive medicines.  She has a very good understanding of how to read the sheet and she has all the medications on hand.  She will be going to [place] tomorrow after her MRI.  Before she leaves she will stop here and have her OnPro placed.  She will therefore receive her Neulasta on 08/09/2019.  She understands that after that she is going to feel like she has the flu for about 48hours.  She has noted only Tylenol and Motrin but also tramadol to take if she she has significant bony aches from that shot  I have asked her to stop  every 2 hours at least while she is driving and take a walk because she is at increased risk of blood clots.  I also alerted her to the possibility of constipation and if she has not had a bowel movement in 2 days she will start stool softeners and any laxative of her choice.  She will keep a symptom diary and return to see me 08/14/2019.  At that time we will check her  nadir counts and troubleshoot any symptoms that were not appropriately taken care of.  She knows that if she has a problem while in [place] we can still take care of it.  She just needs to call and let us know  Total encounter time 30 minutes.Sarajane Jews C. Magrinat, MD 08/07/2019 9:14 AM Medical Oncology and Hematology [facility] [place], [place] [address] Tel. [phone]    Fax. [phone]  08/08/2019 addendum:  [name] stopped and to get her OnPro.  I showed her the MRI which shows the right-sided cancer very clearly.  I do not see adenopathy but she understands I am not a radiologist.      This document serves as a record of services personally performed by Lurline Del, MD. It was created on his behalf by Wilburn Mylar, a trained medical scribe. The creation of this record is based on the scribe's personal observations and the provider's statements to them.   I, Lurline Del MD, have reviewed the above documentation for accuracy and completeness, and I agree with the above.   *Total Encounter Time as defined by the Centers for Medicare and Medicaid Services includes, in addition to the face-to-face time of a patient visit (documented in the note above) non-face-to-face time: obtaining and reviewing outside history, ordering and reviewing medications, tests or procedures, care coordination (communications with other health care professionals or caregivers) and documentation in the medical record.

## 2019-08-06 NOTE — Interval H&P Note (Signed)
History and Physical Interval Note: no change in H and P  08/06/2019 7:06 AM  Debra [name]  has presented today for surgery, with the diagnosis of RIGHT BREAST CANCER.  The various methods of treatment have been discussed with the patient and family. After consideration of risks, benefits and other options for treatment, the patient has consented to  Procedure(s): INSERTION PORT-A-CATH WITH ULTRASOUND GUIDANCE (N/A) as a surgical intervention.  The patient's history has been reviewed, patient examined, no change in status, stable for surgery.  I have reviewed the patient's chart and labs.  Questions were answered to the patient's satisfaction.     [name]

## 2019-08-06 NOTE — Discharge Instructions (Signed)
Do not shower until after chemo tomorrow  Tylenol and ibuprofen also for pain  No vigorous activity for one week   NO TYLENOL UNTIL 12:45 PM  NO ADVIL/ MOTRIN UNTIL 2:45 PM  Call your surgeon if you experience:   1.  Fever over 101.0. 2.  Inability to urinate. 3.  Nausea and/or vomiting. 4.  Extreme swelling or bruising at the surgical site. 5.  Continued bleeding from the incision. 6.  Increased pain, redness or drainage from the incision. 7.  Problems related to your pain medication. 8.  Any problems and/or concerns    Post Anesthesia Home Care Instructions  Activity: Get plenty of rest for the remainder of the day. A responsible individual must stay with you for 24 hours following the procedure.  For the next 24 hours, DO NOT: -Drive a car -Paediatric nurse -Drink alcoholic beverages -Take any medication unless instructed by your physician -Make any legal decisions or sign important papers.  Meals: Start with liquid foods such as gelatin or soup. Progress to regular foods as tolerated. Avoid greasy, spicy, heavy foods. If nausea and/or vomiting occur, drink only clear liquids until the nausea and/or vomiting subsides. Call your physician if vomiting continues.  Special Instructions/Symptoms: Your throat may feel dry or sore from the anesthesia or the breathing tube placed in your throat during surgery. If this causes discomfort, gargle with warm salt water. The discomfort should disappear within 24 hours.  If you had a scopolamine patch placed behind your ear for the management of post- operative nausea and/or vomiting:  1. The medication in the patch is effective for 72 hours, after which it should be removed.  Wrap patch in a tissue and discard in the trash. Wash hands thoroughly with soap and water. 2. You may remove the patch earlier than 72 hours if you experience unpleasant side effects which may include dry mouth, dizziness or visual disturbances. 3. Avoid  touching the patch. Wash your hands with soap and water after contact with the patch.

## 2019-08-06 NOTE — Anesthesia Preprocedure Evaluation (Signed)
Anesthesia Evaluation  Patient identified by MRN, date of birth, ID band Patient awake    Reviewed: Allergy & Precautions, NPO status , Patient's Chart, lab work & pertinent test results  History of Anesthesia Complications Negative for: history of anesthetic complications  Airway Mallampati: III  TM Distance: >3 FB Neck ROM: Full    Dental  (+) Teeth Intact   Pulmonary neg pulmonary ROS, former smoker,    Pulmonary exam normal        Cardiovascular negative cardio ROS Normal cardiovascular exam     Neuro/Psych negative neurological ROS  negative psych ROS   GI/Hepatic negative GI ROS, Neg liver ROS,   Endo/Other  negative endocrine ROS  Renal/GU negative Renal ROS  negative genitourinary   Musculoskeletal negative musculoskeletal ROS (+)   Abdominal   Peds  Hematology negative hematology ROS (+)   Anesthesia Other Findings  Breast cancer  Reproductive/Obstetrics                            Anesthesia Physical Anesthesia Plan  ASA: II  Anesthesia Plan: General   Post-op Pain Management:    Induction: Intravenous  PONV Risk Score and Plan: 3 and Ondansetron, Dexamethasone, Midazolam and Treatment may vary due to age or medical condition  Airway Management Planned: LMA  Additional Equipment: None  Intra-op Plan:   Post-operative Plan: Extubation in OR  Informed Consent: I have reviewed the patients History and Physical, chart, labs and discussed the procedure including the risks, benefits and alternatives for the proposed anesthesia with the patient or authorized representative who has indicated his/her understanding and acceptance.     Dental advisory given  Plan Discussed with:   Anesthesia Plan Comments:         Anesthesia Quick Evaluation

## 2019-08-06 NOTE — Anesthesia Procedure Notes (Signed)
Procedure Name: LMA Insertion Date/Time: 08/06/2019 7:26 AM Performed by: Willa Frater, CRNA Pre-anesthesia Checklist: Patient identified, Emergency Drugs available, Suction available and Patient being monitored Patient Re-evaluated:Patient Re-evaluated prior to induction Oxygen Delivery Method: Circle system utilized Preoxygenation: Pre-oxygenation with 100% oxygen Induction Type: IV induction Ventilation: Mask ventilation without difficulty LMA: LMA inserted LMA Size: 4.0 Number of attempts: 1 Airway Equipment and Method: Bite block Placement Confirmation: positive ETCO2 Tube secured with: Tape Dental Injury: Teeth and Oropharynx as per pre-operative assessment

## 2019-08-06 NOTE — Op Note (Signed)
   Debra [name] 08/06/2019   Pre-op Diagnosis: RIGHT BREAST CANCER     Post-op Diagnosis: same  Procedure(s): INSERTION PORT-A-CATH LEFT SUBCLAVIAN VEIN (8 fr)  Surgeon(s): [name], MD  Anesthesia: General  Staff:  Circulator: Paulo Fruit, RN Scrub Person: Murvin Natal  Estimated Blood Loss: Minimal               Indications: This is a 59 year old female who will be undergoing neoadjuvant chemotherapy for a right breast cancer.  The decision was made to proceed with Port-A-Cath insertion.  Procedure: The patient was brought to the operating room and identifies correct patient.  She is placed upon the operating table and general anesthesia was induced.  Her left neck and chest were prepped and draped in usual sterile fashion.  The patient was placed in Trendelenburg position.  I anesthetized the left chest with Marcaine.  I then used the introducer needle to cannulate the left subclavian vein.  This was easily performed on the first stick.  I passed a wire through the needle and into the central venous system under fluoroscopy.  The needle was then removed.  I anesthetized the skin further with Marcaine.  I made an incision on the left upper chest with a scalpel at the introduction site.  I then created a pocket for the port with the cautery.  Next an 8 [place] port was brought to the field.  I placed the introducer sheath and venous dilator over the wire and into the central venous system easily.  I then attached the catheter to the port after was flushed and cut it an appropriate length.  I placed the port into the pocket on the chest and then fed the catheter down the peel-away sheath.  The sheath was peeled away slowly leave the catheter in the central venous system.  Fluoroscopy confirmed placement in the vena cava.  I accessed the port and good flush and return were demonstrated.  I sutured the port in place with 2 separate 3-0 Prolene sutures.  I then  closed the subcutaneous tissue with interrupted 3-0 Vicryl sutures and closed skin with a running 4-0 Monocryl.  I again accessed the port through the skin and flushed it with concentrated heparin.  The port was left accessed and covered with a bandage.  The patient tolerated the procedure well.  All the counts were correct at the end of the procedure.  The patient was then extubated in the operating room and taken in a stable condition to the recovery room.          [name]   Date: 08/06/2019  Time: 8:01 AM

## 2019-08-06 NOTE — Transfer of Care (Signed)
Immediate Anesthesia Transfer of Care Note  Patient: Debra [name]  Procedure(s) Performed: INSERTION PORT-A-CATH WITH ULTRASOUND GUIDANCE (Left Chest)  Patient Location: PACU  Anesthesia Type:General  Level of Consciousness: awake, alert , oriented and drowsy  Airway & Oxygen Therapy: Patient Spontanous Breathing and Patient connected to face mask oxygen  Post-op Assessment: Report given to RN and Post -op Vital signs reviewed and stable  Post vital signs: Reviewed and stable  Last Vitals:  Vitals Value Taken Time  BP    Temp    Pulse    Resp    SpO2      Last Pain:  Vitals:   08/06/19 0639  TempSrc: Oral  PainSc: 0-No pain         Complications: No apparent anesthesia complications

## 2019-08-06 NOTE — Anesthesia Postprocedure Evaluation (Signed)
Anesthesia Post Note  Patient: Debra [name]  Procedure(s) Performed: INSERTION PORT-A-CATH WITH ULTRASOUND GUIDANCE (Left Chest)     Patient location during evaluation: PACU Anesthesia Type: General Level of consciousness: awake and alert Pain management: pain level controlled Vital Signs Assessment: post-procedure vital signs reviewed and stable Respiratory status: spontaneous breathing, nonlabored ventilation and respiratory function stable Cardiovascular status: blood pressure returned to baseline and stable Postop Assessment: no apparent nausea or vomiting Anesthetic complications: no    Last Vitals:  Vitals:   08/06/19 0900 08/06/19 0925  BP: 119/65 136/84  Pulse: (!) 59 60  Resp: 16 18  Temp:  36.7 C  SpO2: 99% 100%    Last Pain:  Vitals:   08/06/19 0925  TempSrc: Oral  PainSc: 0-No pain                 Lidia Collum

## 2019-08-07 ENCOUNTER — Inpatient Hospital Stay: Payer: BC Managed Care – PPO

## 2019-08-07 ENCOUNTER — Other Ambulatory Visit: Payer: Self-pay

## 2019-08-07 ENCOUNTER — Encounter: Payer: Self-pay | Admitting: Oncology

## 2019-08-07 ENCOUNTER — Inpatient Hospital Stay: Payer: BC Managed Care – PPO | Admitting: Oncology

## 2019-08-07 ENCOUNTER — Inpatient Hospital Stay: Payer: BC Managed Care – PPO | Attending: Oncology

## 2019-08-07 ENCOUNTER — Encounter: Payer: Self-pay | Admitting: *Deleted

## 2019-08-07 VITALS — BP 140/73 | HR 99 | Temp 97.8°F | Resp 18 | Ht 69.5 in | Wt 205.7 lb

## 2019-08-07 DIAGNOSIS — Z803 Family history of malignant neoplasm of breast: Secondary | ICD-10-CM | POA: Insufficient documentation

## 2019-08-07 DIAGNOSIS — Z170 Estrogen receptor positive status [ER+]: Secondary | ICD-10-CM

## 2019-08-07 DIAGNOSIS — Z79899 Other long term (current) drug therapy: Secondary | ICD-10-CM | POA: Diagnosis not present

## 2019-08-07 DIAGNOSIS — C50211 Malignant neoplasm of upper-inner quadrant of right female breast: Secondary | ICD-10-CM | POA: Insufficient documentation

## 2019-08-07 DIAGNOSIS — Z800 Family history of malignant neoplasm of digestive organs: Secondary | ICD-10-CM | POA: Diagnosis not present

## 2019-08-07 DIAGNOSIS — Z7689 Persons encountering health services in other specified circumstances: Secondary | ICD-10-CM | POA: Diagnosis not present

## 2019-08-07 DIAGNOSIS — Z171 Estrogen receptor negative status [ER-]: Secondary | ICD-10-CM | POA: Diagnosis not present

## 2019-08-07 DIAGNOSIS — Z5111 Encounter for antineoplastic chemotherapy: Secondary | ICD-10-CM | POA: Diagnosis not present

## 2019-08-07 DIAGNOSIS — Z808 Family history of malignant neoplasm of other organs or systems: Secondary | ICD-10-CM | POA: Insufficient documentation

## 2019-08-07 DIAGNOSIS — Z87891 Personal history of nicotine dependence: Secondary | ICD-10-CM | POA: Insufficient documentation

## 2019-08-07 DIAGNOSIS — Z7952 Long term (current) use of systemic steroids: Secondary | ICD-10-CM | POA: Diagnosis not present

## 2019-08-07 LAB — CBC WITH DIFFERENTIAL/PLATELET
Abs Immature Granulocytes: 0.02 10*3/uL (ref 0.00–0.07)
Basophils Absolute: 0 10*3/uL (ref 0.0–0.1)
Basophils Relative: 0 %
Eosinophils Absolute: 0.2 10*3/uL (ref 0.0–0.5)
Eosinophils Relative: 2 %
HCT: 37 % (ref 36.0–46.0)
Hemoglobin: 12 g/dL (ref 12.0–15.0)
Immature Granulocytes: 0 %
Lymphocytes Relative: 29 %
Lymphs Abs: 2.8 10*3/uL (ref 0.7–4.0)
MCH: 29.1 pg (ref 26.0–34.0)
MCHC: 32.4 g/dL (ref 30.0–36.0)
MCV: 89.8 fL (ref 80.0–100.0)
Monocytes Absolute: 0.8 10*3/uL (ref 0.1–1.0)
Monocytes Relative: 9 %
Neutro Abs: 5.7 10*3/uL (ref 1.7–7.7)
Neutrophils Relative %: 60 %
Platelets: 271 10*3/uL (ref 150–400)
RBC: 4.12 MIL/uL (ref 3.87–5.11)
RDW: 13 % (ref 11.5–15.5)
WBC: 9.5 10*3/uL (ref 4.0–10.5)
nRBC: 0 % (ref 0.0–0.2)

## 2019-08-07 LAB — COMPREHENSIVE METABOLIC PANEL
ALT: 16 U/L (ref 0–44)
AST: 19 U/L (ref 15–41)
Albumin: 3.6 g/dL (ref 3.5–5.0)
Alkaline Phosphatase: 79 U/L (ref 38–126)
Anion gap: 10 (ref 5–15)
BUN: 14 mg/dL (ref 6–20)
CO2: 22 mmol/L (ref 22–32)
Calcium: 9.3 mg/dL (ref 8.9–10.3)
Chloride: 110 mmol/L (ref 98–111)
Creatinine, Ser: 0.71 mg/dL (ref 0.44–1.00)
GFR calc Af Amer: >60 mL/min (ref >60)
GFR calc non Af Amer: >60 mL/min (ref >60)
Glucose, Bld: 94 mg/dL (ref 70–99)
Potassium: 3.7 mmol/L (ref 3.5–5.1)
Sodium: 142 mmol/L (ref 135–145)
Total Bilirubin: <0.2 mg/dL — ABNORMAL LOW (ref 0.3–1.2)
Total Protein: 6.7 g/dL (ref 6.5–8.1)

## 2019-08-07 MED ORDER — DOXORUBICIN HCL CHEMO IV INJECTION 2 MG/ML
60.0000 mg/m2 | Freq: Once | INTRAVENOUS | Status: AC
  Administered 2019-08-07: 11:00:00 126 mg via INTRAVENOUS
  Filled 2019-08-07: qty 63

## 2019-08-07 MED ORDER — PALONOSETRON HCL INJECTION 0.25 MG/5ML
0.2500 mg | Freq: Once | INTRAVENOUS | Status: AC
  Administered 2019-08-07: 0.25 mg via INTRAVENOUS

## 2019-08-07 MED ORDER — SODIUM CHLORIDE 0.9 % IV SOLN
600.0000 mg/m2 | Freq: Once | INTRAVENOUS | Status: AC
  Administered 2019-08-07: 1260 mg via INTRAVENOUS
  Filled 2019-08-07: qty 63

## 2019-08-07 MED ORDER — PALONOSETRON HCL INJECTION 0.25 MG/5ML
INTRAVENOUS | Status: AC
  Filled 2019-08-07: qty 5

## 2019-08-07 MED ORDER — SODIUM CHLORIDE 0.9 % IV SOLN
150.0000 mg | Freq: Once | INTRAVENOUS | Status: AC
  Administered 2019-08-07: 10:00:00 150 mg via INTRAVENOUS
  Filled 2019-08-07: qty 150

## 2019-08-07 MED ORDER — SODIUM CHLORIDE 0.9% FLUSH
10.0000 mL | INTRAVENOUS | Status: DC | PRN
  Administered 2019-08-07: 10 mL
  Filled 2019-08-07: qty 10

## 2019-08-07 MED ORDER — DEXAMETHASONE SODIUM PHOSPHATE 10 MG/ML IJ SOLN
10.0000 mg | Freq: Once | INTRAMUSCULAR | Status: AC
  Administered 2019-08-07: 10 mg via INTRAVENOUS

## 2019-08-07 MED ORDER — DEXAMETHASONE SODIUM PHOSPHATE 10 MG/ML IJ SOLN
INTRAMUSCULAR | Status: AC
  Filled 2019-08-07: qty 1

## 2019-08-07 MED ORDER — SODIUM CHLORIDE 0.9 % IV SOLN
Freq: Once | INTRAVENOUS | Status: AC
  Filled 2019-08-07: qty 250

## 2019-08-07 NOTE — Patient Instructions (Signed)
[facility] Discharge Instructions for Patients Receiving Chemotherapy  Today you received the following chemotherapy agents Doxorubicin (ADRIAMYCIN) & Cyclophosphamide (CYTOXAN).  To help prevent nausea and vomiting after your treatment, we encourage you to take your nausea medication as prescribed.   If you develop nausea and vomiting that is not controlled by your nausea medication, call the clinic.   BELOW ARE SYMPTOMS THAT SHOULD BE REPORTED IMMEDIATELY:  *FEVER GREATER THAN 100.5 F  *CHILLS WITH OR WITHOUT FEVER  NAUSEA AND VOMITING THAT IS NOT CONTROLLED WITH YOUR NAUSEA MEDICATION  *UNUSUAL SHORTNESS OF BREATH  *UNUSUAL BRUISING OR BLEEDING  TENDERNESS IN MOUTH AND THROAT WITH OR WITHOUT PRESENCE OF ULCERS  *URINARY PROBLEMS  *BOWEL PROBLEMS  UNUSUAL RASH Items with * indicate a potential emergency and should be followed up as soon as possible.  Feel free to call the clinic should you have any questions or concerns. The clinic phone number is [phone].  Please show the CHEMO ALERT CARD at check-in to the Emergency Department and triage nurse.  Doxorubicin injection What is this medicine? DOXORUBICIN (dox oh ROO bi sin) is a chemotherapy drug. It is used to treat many kinds of cancer like leukemia, lymphoma, neuroblastoma, sarcoma, and Wilms' tumor. It is also used to treat bladder cancer, breast cancer, lung cancer, ovarian cancer, stomach cancer, and thyroid cancer. This medicine may be used for other purposes; ask your health care provider or pharmacist if you have questions. COMMON BRAND NAME(S): Adriamycin, Adriamycin PFS, Adriamycin RDF, Rubex What should I tell my health care provider before I take this medicine? They need to know if you have any of these conditions:  heart disease  history of low blood counts caused by a medicine  liver disease  recent or ongoing radiation therapy  an unusual or allergic reaction to doxorubicin,  other chemotherapy agents, other medicines, foods, dyes, or preservatives  pregnant or trying to get pregnant  breast-feeding How should I use this medicine? This drug is given as an infusion into a vein. It is administered in a hospital or clinic by a specially trained health care professional. If you have pain, swelling, burning or any unusual feeling around the site of your injection, tell your health care professional right away. Talk to your pediatrician regarding the use of this medicine in children. Special care may be needed. Overdosage: If you think you have taken too much of this medicine contact a poison control center or emergency room at once. NOTE: This medicine is only for you. Do not share this medicine with others. What if I miss a dose? It is important not to miss your dose. Call your doctor or health care professional if you are unable to keep an appointment. What may interact with this medicine? This medicine may interact with the following medications:  6-mercaptopurine  paclitaxel  phenytoin  St. John's Wort  trastuzumab  verapamil This list may not describe all possible interactions. Give your health care provider a list of all the medicines, herbs, non-prescription drugs, or dietary supplements you use. Also tell them if you smoke, drink alcohol, or use illegal drugs. Some items may interact with your medicine. What should I watch for while using this medicine? This drug may make you feel generally unwell. This is not uncommon, as chemotherapy can affect healthy cells as well as cancer cells. Report any side effects. Continue your course of treatment even though you feel ill unless your doctor tells you to stop. There is a   maximum amount of this medicine you should receive throughout your life. The amount depends on the medical condition being treated and your overall health. Your doctor will watch how much of this medicine you receive in your lifetime. Tell your  doctor if you have taken this medicine before. You may need blood work done while you are taking this medicine. Your urine may turn red for a few days after your dose. This is not blood. If your urine is dark or brown, call your doctor. In some cases, you may be given additional medicines to help with side effects. Follow all directions for their use. Call your doctor or health care professional for advice if you get a fever, chills or sore throat, or other symptoms of a cold or flu. Do not treat yourself. This drug decreases your body's ability to fight infections. Try to avoid being around people who are sick. This medicine may increase your risk to bruise or bleed. Call your doctor or health care professional if you notice any unusual bleeding. Talk to your doctor about your risk of cancer. You may be more at risk for certain types of cancers if you take this medicine. Do not become pregnant while taking this medicine or for 6 months after stopping it. Women should inform their doctor if they wish to become pregnant or think they might be pregnant. Men should not father a child while taking this medicine and for 6 months after stopping it. There is a potential for serious side effects to an unborn child. Talk to your health care professional or pharmacist for more information. Do not breast-feed an infant while taking this medicine. This medicine has caused ovarian failure in some women and reduced sperm counts in some men This medicine may interfere with the ability to have a child. Talk with your doctor or health care professional if you are concerned about your fertility. This medicine may cause a decrease in Co-Enzyme Q-10. You should make sure that you get enough Co-Enzyme Q-10 while you are taking this medicine. Discuss the foods you eat and the vitamins you take with your health care professional. What side effects may I notice from receiving this medicine? Side effects that you should report to  your doctor or health care professional as soon as possible:  allergic reactions like skin rash, itching or hives, swelling of the face, lips, or tongue  breathing problems  chest pain  fast or irregular heartbeat  low blood counts - this medicine may decrease the number of white blood cells, red blood cells and platelets. You may be at increased risk for infections and bleeding.  pain, redness, or irritation at site where injected  signs of infection - fever or chills, cough, sore throat, pain or difficulty passing urine  signs of decreased platelets or bleeding - bruising, pinpoint red spots on the skin, black, tarry stools, blood in the urine  swelling of the ankles, feet, hands  tiredness  weakness Side effects that usually do not require medical attention (report to your doctor or health care professional if they continue or are bothersome):  diarrhea  hair loss  mouth sores  nail discoloration or damage  nausea  red colored urine  vomiting This list may not describe all possible side effects. Call your doctor for medical advice about side effects. You may report side effects to FDA at 1-800-FDA-1088. Where should I keep my medicine? This drug is given in a hospital or clinic and will not be stored   at home. NOTE: This sheet is a summary. It may not cover all possible information. If you have questions about this medicine, talk to your doctor, pharmacist, or health care provider.  2020 Elsevier/Gold Standard (2016-12-06 11:01:26)  Cyclophosphamide Injection What is this medicine? CYCLOPHOSPHAMIDE (sye kloe FOSS fa mide) is a chemotherapy drug. It slows the growth of cancer cells. This medicine is used to treat many types of cancer like lymphoma, myeloma, leukemia, breast cancer, and ovarian cancer, to name a few. This medicine may be used for other purposes; ask your health care provider or pharmacist if you have questions. COMMON BRAND NAME(S): Cytoxan,  Neosar What should I tell my health care provider before I take this medicine? They need to know if you have any of these conditions:  heart disease  history of irregular heartbeat  infection  kidney disease  liver disease  low blood counts, like white cells, platelets, or red blood cells  on hemodialysis  recent or ongoing radiation therapy  scarring or thickening of the lungs  trouble passing urine  an unusual or allergic reaction to cyclophosphamide, other medicines, foods, dyes, or preservatives  pregnant or trying to get pregnant  breast-feeding How should I use this medicine? This drug is usually given as an injection into a vein or muscle or by infusion into a vein. It is administered in a hospital or clinic by a specially trained health care professional. Talk to your pediatrician regarding the use of this medicine in children. Special care may be needed. Overdosage: If you think you have taken too much of this medicine contact a poison control center or emergency room at once. NOTE: This medicine is only for you. Do not share this medicine with others. What if I miss a dose? It is important not to miss your dose. Call your doctor or health care professional if you are unable to keep an appointment. What may interact with this medicine?  amphotericin B  azathioprine  certain antivirals for HIV or hepatitis  certain medicines for blood pressure, heart disease, irregular heart beat  certain medicines that treat or prevent blood clots like warfarin  certain other medicines for cancer  cyclosporine  etanercept  indomethacin  medicines that relax muscles for surgery  medicines to increase blood counts  metronidazole This list may not describe all possible interactions. Give your health care provider a list of all the medicines, herbs, non-prescription drugs, or dietary supplements you use. Also tell them if you smoke, drink alcohol, or use illegal  drugs. Some items may interact with your medicine. What should I watch for while using this medicine? Your condition will be monitored carefully while you are receiving this medicine. You may need blood work done while you are taking this medicine. Drink water or other fluids as directed. Urinate often, even at night. Some products may contain alcohol. Ask your health care professional if this medicine contains alcohol. Be sure to tell all health care professionals you are taking this medicine. Certain medicines, like metronidazole and disulfiram, can cause an unpleasant reaction when taken with alcohol. The reaction includes flushing, headache, nausea, vomiting, sweating, and increased thirst. The reaction can last from 30 minutes to several hours. Do not become pregnant while taking this medicine or for 1 year after stopping it. Women should inform their health care professional if they wish to become pregnant or think they might be pregnant. Men should not father a child while taking this medicine and for 4 months after stopping it.   There is potential for serious side effects to an unborn child. Talk to your health care professional for more information. Do not breast-feed an infant while taking this medicine or for 1 week after stopping it. This medicine has caused ovarian failure in some women. This medicine may make it more difficult to get pregnant. Talk to your health care professional if you are concerned about your fertility. This medicine has caused decreased sperm counts in some men. This may make it more difficult to father a child. Talk to your health care professional if you are concerned about your fertility. Call your health care professional for advice if you get a fever, chills, or sore throat, or other symptoms of a cold or flu. Do not treat yourself. This medicine decreases your body's ability to fight infections. Try to avoid being around people who are sick. Avoid taking medicines  that contain aspirin, acetaminophen, ibuprofen, naproxen, or ketoprofen unless instructed by your health care professional. These medicines may hide a fever. Talk to your health care professional about your risk of cancer. You may be more at risk for certain types of cancer if you take this medicine. If you are going to need surgery or other procedure, tell your health care professional that you are using this medicine. Be careful brushing or flossing your teeth or using a toothpick because you may get an infection or bleed more easily. If you have any dental work done, tell your dentist you are receiving this medicine. What side effects may I notice from receiving this medicine? Side effects that you should report to your doctor or health care professional as soon as possible:  allergic reactions like skin rash, itching or hives, swelling of the face, lips, or tongue  breathing problems  nausea, vomiting  signs and symptoms of bleeding such as bloody or black, tarry stools; red or dark brown urine; spitting up blood or brown material that looks like coffee grounds; red spots on the skin; unusual bruising or bleeding from the eyes, gums, or nose  signs and symptoms of heart failure like fast, irregular heartbeat, sudden weight gain; swelling of the ankles, feet, hands  signs and symptoms of infection like fever; chills; cough; sore throat; pain or trouble passing urine  signs and symptoms of kidney injury like trouble passing urine or change in the amount of urine  signs and symptoms of liver injury like dark yellow or brown urine; general ill feeling or flu-like symptoms; light-colored stools; loss of appetite; nausea; right upper belly pain; unusually weak or tired; yellowing of the eyes or skin Side effects that usually do not require medical attention (report to your doctor or health care professional if they continue or are bothersome):  confusion  decreased  hearing  diarrhea  facial flushing  hair loss  headache  loss of appetite  missed menstrual periods  signs and symptoms of low red blood cells or anemia such as unusually weak or tired; feeling faint or lightheaded; falls  skin discoloration This list may not describe all possible side effects. Call your doctor for medical advice about side effects. You may report side effects to FDA at 1-800-FDA-1088. Where should I keep my medicine? This drug is given in a hospital or clinic and will not be stored at home. NOTE: This sheet is a summary. It may not cover all possible information. If you have questions about this medicine, talk to your doctor, pharmacist, or health care provider.  2020 Elsevier/Gold Standard (2019-01-27 09:53:29)  

## 2019-08-07 NOTE — Progress Notes (Signed)
PtwasapprovedforFulphila from3/31/21to3/30/22for up to $10,000.The program reduces pt's copay responsibility to $0.

## 2019-08-08 ENCOUNTER — Telehealth: Payer: Self-pay | Admitting: Oncology

## 2019-08-08 ENCOUNTER — Telehealth: Payer: Self-pay | Admitting: *Deleted

## 2019-08-08 ENCOUNTER — Inpatient Hospital Stay: Payer: BC Managed Care – PPO

## 2019-08-08 ENCOUNTER — Other Ambulatory Visit: Payer: Self-pay

## 2019-08-08 ENCOUNTER — Ambulatory Visit (HOSPITAL_COMMUNITY)
Admission: RE | Admit: 2019-08-08 | Discharge: 2019-08-08 | Disposition: A | Discharge status: Home / Self Care | Payer: BC Managed Care – PPO | Source: Ambulatory Visit | Attending: Oncology | Admitting: Oncology

## 2019-08-08 VITALS — BP 137/72 | HR 88 | Temp 98.2°F | Resp 18

## 2019-08-08 DIAGNOSIS — Z170 Estrogen receptor positive status [ER+]: Secondary | ICD-10-CM | POA: Insufficient documentation

## 2019-08-08 DIAGNOSIS — C50211 Malignant neoplasm of upper-inner quadrant of right female breast: Secondary | ICD-10-CM | POA: Diagnosis not present

## 2019-08-08 MED ORDER — PEGFILGRASTIM 6 MG/0.6ML ~~LOC~~ PSKT
PREFILLED_SYRINGE | SUBCUTANEOUS | Status: AC
  Filled 2019-08-08: qty 0.6

## 2019-08-08 MED ORDER — GADOBUTROL 1 MMOL/ML IV SOLN
10.0000 mL | Freq: Once | INTRAVENOUS | Status: AC | PRN
  Administered 2019-08-08: 10 mL via INTRAVENOUS

## 2019-08-08 MED ORDER — PEGFILGRASTIM 6 MG/0.6ML ~~LOC~~ PSKT
6.0000 mg | PREFILLED_SYRINGE | Freq: Once | SUBCUTANEOUS | Status: AC
  Administered 2019-08-08: 6 mg via SUBCUTANEOUS

## 2019-08-08 NOTE — Patient Instructions (Signed)

## 2019-08-08 NOTE — Telephone Encounter (Signed)
-----   Message from Georgianne Fick, RN sent at 08/07/2019 12:09 PM EDT ----- Regarding: Dr. [name] First Time Chemo Patient received first time Doxorubicin and Cyclophosphamide today and tolerated these well.

## 2019-08-08 NOTE — Telephone Encounter (Signed)
Called pt to see how she did with her treatment yest.  She denies any problems today but states she was really tired yest.  She has her On-Pro & is taking her nausea meds as prescribed & states she will call with any questions/concerns.

## 2019-08-08 NOTE — Telephone Encounter (Signed)
Rescheduled 4/7 appts to 4/8 per 4/1 los. Left voicemail with appt details.

## 2019-08-09 ENCOUNTER — Ambulatory Visit: Payer: BC Managed Care – PPO

## 2019-08-11 ENCOUNTER — Telehealth: Payer: Self-pay | Admitting: Oncology

## 2019-08-11 ENCOUNTER — Other Ambulatory Visit: Payer: Self-pay | Admitting: Oncology

## 2019-08-11 ENCOUNTER — Encounter: Payer: Self-pay | Admitting: *Deleted

## 2019-08-11 NOTE — Progress Notes (Signed)
Called pt and discussed MRI results. Pt related doing well after 1st chemo. Discussed on pro that the medication is not in the chamber and will bring in to [facility] for nadir check to ensure medication was distributed and went in her arm. Denies further questions or needs at time. Encourage pt to reach out with concerns.

## 2019-08-11 NOTE — Progress Notes (Signed)
Debra [name]'s OnPro came off and she never got the shot.  She never had any bony aches or flulike feelings.  She is going to need Neupogen or a bio similar for the next 4 days beginning tomorrow.  I am putting those orders in.

## 2019-08-11 NOTE — Telephone Encounter (Signed)
Scheduled appt per 4/5 sch message - pt aware of appts added

## 2019-08-12 ENCOUNTER — Ambulatory Visit: Payer: BC Managed Care – PPO

## 2019-08-13 ENCOUNTER — Inpatient Hospital Stay: Payer: BC Managed Care – PPO | Admitting: Oncology

## 2019-08-13 ENCOUNTER — Ambulatory Visit: Payer: BC Managed Care – PPO

## 2019-08-13 ENCOUNTER — Inpatient Hospital Stay: Payer: BC Managed Care – PPO

## 2019-08-13 ENCOUNTER — Encounter: Payer: Self-pay | Admitting: *Deleted

## 2019-08-14 ENCOUNTER — Other Ambulatory Visit: Payer: BC Managed Care – PPO

## 2019-08-14 ENCOUNTER — Ambulatory Visit: Payer: BC Managed Care – PPO | Admitting: Oncology

## 2019-08-14 ENCOUNTER — Ambulatory Visit: Payer: BC Managed Care – PPO

## 2019-08-14 NOTE — Progress Notes (Signed)
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   She started on neoadjuvant chemotherapy of cyclophosphamide and doxorubicin in dose dense fashion x4 on 08/07/2019. She had OnPro placed on 08/08/2019, but this came off and it was not clear whether she ever got the shot.  She never had any bony aches or flulike feelings.  I prescribed Neupogen or a bio similar for her to take the next 4 days but after much discussion we decided most likely she had received at least part of the Neulasta and we made the appointment for her to return today to check her nadir counts..  Since her last visit, she underwent breast MRI  On 08/08/2019 showing: breast composition B; 5 cm biopsy-proven right breast malignancy; no additional suspicious findings in the right breast; no evidence of left breast malignancy or suspicious lymphadenopathy.   REVIEW OF SYSTEMS: Debra [name] enjoyed the visit with her grandchildren which is the reason she received OnPro instead of the usual Neulasta on day 3.  She generally did well with chemotherapy for the first 2 or 3 days but she did develop reflux possibly from the steroids.  She was prescribed Pepcid which was helpful.  She was constipated the first 3 days.  She  had a little bit of drowsiness from the promethazine which she took religiously 4 times a day for 4 days.  She never had any nausea or vomiting issues, mouth sores, fever, rash, or bleeding.  She has had significant taste alteration.  A detailed review of systems today was otherwise noncontributory   HISTORY OF CURRENT ILLNESS: From the original intake note:  Debra [name] herself palpated a mass in the upper-inner right breast in February 2021.  It appeared to increase in size over the next 3 weeks. Physical exam performed at [facility] 07/15/2019 confirmed a 6 mm firm, oval, palpable mass in the upper-inner right breast. She underwent bilateral diagnostic mammography with tomography and bilateral breast ultrasonography on 07/15/2019 showing: breast density category C; 4.6 cm mass in the right breast at 1 o'clock; single abnormal-appearing lymph node; bilateral benign cysts.  Accordingly on 07/21/2019 she proceeded to biopsy of the right breast area in question. 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 Prognostic indicators significant for: estrogen receptor, 60% positive with weak staining intensity and progesterone receptor, 0% negative. Proliferation marker Ki67 at 90%. HER2 equivocal by immunohistochemistry (2+), but negative by fluorescent in situ hybridization with a signals ratio 1.24 and number per cell 2.55.  The questionable right axillary lymph node was biopsied as well and was benign (concordant).  The patient's subsequent history is as detailed below.   PAST MEDICAL HISTORY: No past medical history on file.   PAST SURGICAL HISTORY: Past Surgical History:  Procedure Laterality Date   ABDOMINAL HYSTERECTOMY     LUNG BIOPSY  PORTACATH PLACEMENT Left 08/06/2019   Procedure: INSERTION PORT-A-CATH WITH ULTRASOUND GUIDANCE;  Surgeon: [name], MD;  Location: [facility];  Service: General;  Laterality: Left;  Status post bilateral salpingo-oophorectomy   FAMILY HISTORY: Family History  Problem Relation Age of Onset   Skin cancer Sister    Colon cancer Paternal Grandfather    Wilm's tumor Son     Her father is 22 years old as of 07/2019. Her mother was murdered at age 59 (domestic violence).  The patient has 3 sisters and no brothers. She reports a third cousin (grandmother's sister's Debra) with breast cancer in her 56's. She denies a family history of ovarian, prostate, or pancreatic cancer. She does report colon cancer in her paternal grandfather in his 4's, non-melanoma skin cancer in her sister in her 8's, and Wilms tumor in her son at 28 months.   GYNECOLOGIC HISTORY:  Patient's last menstrual period was 05/09/2011. Menarche: 28-57 years old Age at first live birth: 59 years old Debra [name] 3 LMP 2013 Contraceptive never used HRT used for approximately 9 months  Hysterectomy? Yes, 2013 BSO? 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 She lives at home with Debra Debra [name] 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 Son Debra [name], age 10, is an Production designer, theatre/television/film in [place] in [place], [place]. Son Debra [name], age 48, is a Marketing executive working in Artist in [place], [place] (at the [employer]). Debra [name] has three grandchildren. She is a Media planner.    ADVANCED DIRECTIVES: In the absence of any documentation to the contrary, the patient's spouse is their HCPOA.    HEALTH MAINTENANCE: Social History   Tobacco Use   Smoking status: Former Smoker   Smokeless tobacco: Never Used  Substance Use Topics   Alcohol use: Yes   Drug use: Never     Colonoscopy: none on file  PAP: none on file (s/[name] hysterectomy)  Bone density: n/a (age)   No Known  Allergies  Current Outpatient Medications  Medication Sig Dispense Refill   ciprofloxacin (CIPRO) 500 MG tablet Take 1 tablet (500 mg total) by mouth 2 (two) times daily. Start one week after chemotherapy and continue for 5 days, then repeat next chemotherapy cycle. 40 tablet 0   dexamethasone (DECADRON) 4 MG tablet Take 2 tablets by mouth daily starting the day after Carboplatin and Cytoxan x 3 days. Take with food. 30 tablet 1   lidocaine-prilocaine (EMLA) cream Apply to affected area once 30 g 3   loratadine (CLARITIN) 10 MG tablet Take 1 tablet (10 mg total) by mouth daily. 60 tablet 0   LORazepam (ATIVAN) 0.5 MG tablet Take 1 tablet (0.5 mg total) by mouth at bedtime as needed (Nausea or vomiting). 30 tablet 0   Multiple Vitamin (MULTI-VITAMIN DAILY PO) Take by mouth.     prochlorperazine (COMPAZINE) 10 MG tablet Take 1 tablet (10 mg total) by mouth every 6 (six) hours as needed (Nausea or vomiting). 30 tablet 1   traMADol (ULTRAM) 50 MG tablet Take 1 tablet (50 mg total) by mouth every 6 (six) hours as needed for moderate pain or severe pain. 20 tablet 0   UNABLE TO FIND Omega Krill Oil     No current facility-administered medications for this visit.    OBJECTIVE: White woman who appears stated age  64:   08/15/19 1038  BP: 132/71  Pulse: 69  Resp: 18  Temp: 98.3 F (36.8 C)  SpO2: 100%     Body mass index is 28.98 kg/m.   Wt Readings from Last 3 Encounters:  08/15/19 199 lb 1.6 oz (90.3 kg)  08/07/19 205 lb 11.2 oz (93.3 kg)  08/06/19 202 lb 9.6 oz (91.9 kg)      ECOG FS:1 - Symptomatic but completely ambulatory  Sclerae unicteric, EOMs intact Wearing a mask No cervical or supraclavicular adenopathy Lungs no rales or rhonchi Heart regular rate and rhythm Abd soft, nontender, positive bowel sounds MSK no focal spinal tenderness, no upper extremity lymphedema Neuro: nonfocal, well oriented, appropriate affect Breasts: The mass in the right breast is  easily palpable, movable, not involving skin or nipple.   LAB RESULTS:  CMP     Component Value Date/Time   NA 138 08/15/2019 1016   K 4.3 08/15/2019 1016   CL 107 08/15/2019 1016   CO2 25 08/15/2019 1016   GLUCOSE 86 08/15/2019 1016   BUN 16 08/15/2019 1016   CREATININE 0.72 08/15/2019 1016   CREATININE 0.76 07/30/2019 0825   CALCIUM 9.5 08/15/2019 1016   PROT 7.1 08/15/2019 1016   ALBUMIN 3.9 08/15/2019 1016   AST 13 (L) 08/15/2019 1016   AST 14 (L) 07/30/2019 0825   ALT 20 08/15/2019 1016   ALT 11 07/30/2019 0825   ALKPHOS 101 08/15/2019 1016   BILITOT 0.3 08/15/2019 1016   BILITOT 0.4 07/30/2019 0825   GFRNONAA >60 08/15/2019 1016   GFRNONAA >60 07/30/2019 0825   GFRAA >60 08/15/2019 1016   GFRAA >60 07/30/2019 0825    No results found for: TOTALPROTELP, ALBUMINELP, A1GS, A2GS, BETS, BETA2SER, GAMS, MSPIKE, SPEI  Lab Results  Component Value Date   WBC 1.6 (L) 08/15/2019   NEUTROABS 0.2 (LL) 08/15/2019   HGB 12.2 08/15/2019   HCT 38.0 08/15/2019   MCV 90.9 08/15/2019   PLT 174 08/15/2019    No results found for: LABCA2  No components found for: [id number]  No results for input(s): INR in the last 168 hours.  No results found for: LABCA2  No results found for: NGE952  No results found for: WUX324  No results found for: MWN027  No results found for: CA2729  No components found for: HGQUANT  No results found for: CEA1 / No results found for: CEA1   No results found for: AFPTUMOR  No results found for: CHROMOGRNA  No results found for: KPAFRELGTCHN, LAMBDASER, KAPLAMBRATIO (kappa/lambda light chains)  No results found for: HGBA, HGBA2QUANT, HGBFQUANT, HGBSQUAN (Hemoglobinopathy evaluation)   No results found for: LDH  No results found for: IRON, TIBC, IRONPCTSAT (Iron and TIBC)  No results found for: FERRITIN  Urinalysis No results found for: COLORURINE, APPEARANCEUR, LABSPEC, PHURINE, GLUCOSEU, HGBUR, BILIRUBINUR, KETONESUR, PROTEINUR,  UROBILINOGEN, NITRITE, LEUKOCYTESUR   STUDIES: MR BREAST BILATERAL W WO CONTRAST INC CAD  Result Date: 08/11/2019 CLINICAL DATA:  59 year old female with newly diagnosed right breast cancer. Biopsy of a right axillary lymph node was negative for metastatic disease. LABS:  None performed on site. EXAM: BILATERAL BREAST MRI WITH AND WITHOUT CONTRAST TECHNIQUE: Multiplanar, multisequence MR images of both breasts were obtained prior to and following the intravenous administration of 10 ml of Gadavist. Three-dimensional MR images were rendered by post-processing of the original MR data on an independent workstation. The three-dimensional MR images were interpreted, and findings are reported in the following complete MRI report for this study. Three dimensional images were evaluated at  the independent DynaCad workstation COMPARISON:  Previous exam(s). FINDINGS: Breast composition: b. Scattered fibroglandular tissue. Background parenchymal enhancement: Mild. Right breast: Susceptibility artifact from post biopsy clip is seen in association with an irregular, markedly enhancing mass in the upper inner right breast. This is consistent with the patient's biopsy-proven site of malignancy. It measures 5.0 x 4.0 x 4.0 cm (AP by transverse by craniocaudal dimensions). No additional suspicious mass or abnormal enhancement is identified in the remainder of the right breast. Left breast: No suspicious mass or abnormal enhancement. Multiple benign nonenhancing cysts are noted throughout the left breast. A circumscribed mass with associated susceptibility artifact, likely from associated calcification, is seen in the lower inner left breast at posterior depth. This demonstrates a mammographic correlate which is seen dating back to 2013, consistent with a benign etiology. Lymph nodes: No abnormal appearing lymph nodes. Ancillary findings:  None. IMPRESSION: 1. 5 cm enhancing mass in the upper inner right breast consistent with  the patient's biopsy-proven site of malignancy. No additional suspicious findings in the remainder of the right breast. 2. No MRI evidence of malignancy on the left. 3. No suspicious lymphadenopathy. RECOMMENDATION: Per clinical treatment plan. BI-RADS CATEGORY  6: Known biopsy-proven malignancy. Electronically Signed   By: Kristopher Oppenheim M.D.   On: 08/11/2019 09:08   DG Chest Port 1 View  Result Date: 08/06/2019 CLINICAL DATA:  Port-A-Cath placement.  History of breast carcinoma EXAM: PORTABLE CHEST 1 VIEW COMPARISON:  None. FINDINGS: Port-A-Cath tip is at the cavoatrial junction. No pneumothorax. Lungs are clear. Heart size and pulmonary vascularity are normal. No adenopathy. No bone lesions. IMPRESSION: Port-A-Cath tip at cavoatrial junction. No pneumothorax. Lungs clear. Cardiac silhouette within normal limits. Electronically Signed   By: Lowella Grip III M.D.   On: 08/06/2019 09:09   DG Fluoro Guide CV Line-No Report  Result Date: 08/06/2019 Fluoroscopy was utilized by the requesting physician.  No radiographic interpretation.   ECHOCARDIOGRAM COMPLETE  Result Date: 08/05/2019    ECHOCARDIOGRAM REPORT   Patient Name:   NYLIA GAVINA Date of Exam: 08/05/2019 Medical Rec #:  [id number]       Height:       68.0 in Accession #:    [id number]      Weight:       200.0 lb Date of Birth:  January 09, 1961       BSA:          2.044 m Patient Age:    89 years        BP:           119/76 mmHg Patient Gender: F               HR:           70 bpm. Exam Location:  Outpatient Procedure: 2D Echo, Cardiac Doppler, Color Doppler and Strain Analysis Indications:    Z51.11 Encounter for antineoplastic chemotheraphy  History:        Patient has no prior history of Echocardiogram examinations.  Sonographer:    Jonelle Sidle Dance Referring Phys: [place]  1. Left ventricular ejection fraction, by estimation, is 60 to 65%. The left ventricle has normal function. Left ventricular endocardial border not  optimally defined to evaluate regional wall motion. Left ventricular diastolic parameters were normal.  2. Right ventricular systolic function is normal. The right ventricular size is normal. Tricuspid regurgitation signal is inadequate for assessing PA pressure.  3. The mitral valve is normal in structure. Trivial mitral valve  regurgitation. No evidence of mitral stenosis.  4. The aortic valve is tricuspid. Aortic valve regurgitation is not visualized. No aortic stenosis is present.  5. The inferior vena cava is normal in size with greater than 50% respiratory variability, suggesting right atrial pressure of 3 mmHg. Comparison(s): No prior Echocardiogram. Conclusion(s)/Recommendation(s): Normal biventricular function without evidence of hemodynamically significant valvular heart disease. FINDINGS  Left Ventricle: Left ventricular ejection fraction, by estimation, is 60 to 65%. The left ventricle has normal function. Left ventricular endocardial border not optimally defined to evaluate regional wall motion. The left ventricular internal cavity size was normal in size. There is no left ventricular hypertrophy. Left ventricular diastolic parameters were normal. Right Ventricle: The right ventricular size is normal. No increase in right ventricular wall thickness. Right ventricular systolic function is normal. Tricuspid regurgitation signal is inadequate for assessing PA pressure. Left Atrium: Left atrial size was normal in size. Right Atrium: Right atrial size was normal in size. Pericardium: Trivial pericardial effusion is present. Mitral Valve: The mitral valve is normal in structure. Trivial mitral valve regurgitation. No evidence of mitral valve stenosis. Tricuspid Valve: The tricuspid valve is normal in structure. Tricuspid valve regurgitation is trivial. No evidence of tricuspid stenosis. Aortic Valve: The aortic valve is tricuspid. Aortic valve regurgitation is not visualized. No aortic stenosis is present.  Pulmonic Valve: The pulmonic valve was grossly normal. Pulmonic valve regurgitation is not visualized. No evidence of pulmonic stenosis. Aorta: The aortic root, ascending aorta and aortic arch are all structurally normal, with no evidence of dilitation or obstruction. Pulmonary Artery: The pulmonary artery is not well seen. Venous: The inferior vena cava is normal in size with greater than 50% respiratory variability, suggesting right atrial pressure of 3 mmHg. IAS/Shunts: No atrial level shunt detected by color flow Doppler.  LEFT VENTRICLE PLAX 2D LVIDd:         4.80 cm  Diastology LVIDs:         3.00 cm  LV e' lateral:   9.68 cm/s LV PW:         1.00 cm  LV E/e' lateral: 10.6 LV IVS:        1.00 cm  LV e' medial:    8.81 cm/s LVOT diam:     2.10 cm  LV E/e' medial:  11.7 LV SV:         84 LV SV Index:   41       2D Longitudinal Strain LVOT Area:     3.46 cm 2D Strain GLS (A2C):   -21.1 %                         2D Strain GLS (A3C):   -16.7 %                         2D Strain GLS (A4C):   -17.9 %                         2D Strain GLS Avg:     -18.6 % RIGHT VENTRICLE             IVC RV Basal diam:  3.50 cm     IVC diam: 1.70 cm RV Mid diam:    2.20 cm RV S prime:     12.90 cm/s TAPSE (M-mode): 2.5 cm LEFT ATRIUM  Index       RIGHT ATRIUM           Index LA diam:        3.60 cm 1.76 cm/m  RA Area:     19.20 cm LA Vol (A2C):   71.0 ml 34.74 ml/m RA Volume:   53.70 ml  26.28 ml/m LA Vol (A4C):   49.3 ml 24.12 ml/m LA Biplane Vol: 61.2 ml 29.95 ml/m  AORTIC VALVE LVOT Vmax:   110.00 cm/s LVOT Vmean:  70.100 cm/s LVOT VTI:    0.243 m  AORTA Ao Root diam: 3.50 cm Ao Asc diam:  3.40 cm MITRAL VALVE MV Area (PHT): 3.91 cm     SHUNTS MV Decel Time: 194 msec     Systemic VTI:  0.24 m MV E velocity: 103.00 cm/s  Systemic Diam: 2.10 cm MV A velocity: 86.60 cm/s MV E/A ratio:  1.19 Buford Dresser MD Electronically signed by Buford Dresser MD Signature Date/Time: 08/05/2019/6:51:29 PM    Final     [place] AXILLARY NODE CORE BIOPSY RIGHT  Addendum Date: 07/23/2019   ADDENDUM REPORT: 07/22/2019 15:04 ADDENDUM: Pathology revealed POORLY DIFFERENTIATED GRADE III INVASIVE DUCTAL CARCINOMA WITH METAPLASTIC FEATURES of the RIGHT breast, 1 o'clock. This was found to be concordant by Dr. Dorise Bullion. Pathology revealed BENIGN LYMPH NODE of the RIGHT axilla. This was found to be concordant by Dr. Dorise Bullion. Pathology results were discussed with the patient by telephone. The patient reported doing well after the biopsies with tenderness at the sites. Post biopsy instructions and care were reviewed and questions were answered. The patient was encouraged to call [facility] for any additional concerns. The patient was referred to [facility] on July 30, 2019. Pathology results reported by Stacie Acres RN on 07/22/2019. Electronically Signed   By: Dorise Bullion III M.D   On: 07/22/2019 15:04   Result Date: 07/23/2019 CLINICAL DATA:  Biopsy of a right breast mass and a right axillary lymph node. EXAM: ULTRASOUND GUIDED RIGHT BREAST CORE NEEDLE BIOPSY COMPARISON:  Previous exam(s). PROCEDURE: I met with the patient and we discussed the procedure of ultrasound-guided biopsy, including benefits and alternatives. We discussed the high likelihood of a successful procedure. We discussed the risks of the procedure, including infection, bleeding, tissue injury, clip migration, and inadequate sampling. Informed written consent was given. The usual time-out protocol was performed immediately prior to the procedure. Lesion quadrant: 1 o'clock Using sterile technique and 1% Lidocaine as local anesthetic, under direct ultrasound visualization, a 12 gauge spring-loaded device was used to perform biopsy of a 1 o'clock right breast mass using a lateral approach. At the conclusion of the procedure tissue marker clip was  deployed into the biopsy cavity. Follow up 2 view mammogram was performed and dictated separately. I met with the patient and we discussed the procedure of ultrasound-guided biopsy, including benefits and alternatives. We discussed the high likelihood of a successful procedure. We discussed the risks of the procedure, including infection, bleeding, tissue injury, clip migration, and inadequate sampling. Informed written consent was given. The usual time-out protocol was performed immediately prior to the procedure. Lesion quadrant: A right axillary lymph node Using sterile technique and 1% Lidocaine as local anesthetic, under direct ultrasound visualization, a 14 gauge spring-loaded device was used to perform biopsy of right axillary lymph node using a lateral approach. At the conclusion of the procedure a HydroMARK tissue marker clip  was deployed into the biopsy cavity. Follow up 2 view mammogram was performed and dictated separately. IMPRESSION: Ultrasound guided biopsy of a right breast mass and a right axillary lymph node. No apparent complications. Electronically Signed: By: Dorise Bullion III M.D On: 07/21/2019 14:27   MM CLIP PLACEMENT RIGHT  Result Date: 07/21/2019 CLINICAL DATA:  Evaluate biopsy marker EXAM: DIAGNOSTIC RIGHT MAMMOGRAM POST ULTRASOUND BIOPSY COMPARISON:  Previous exam(s). FINDINGS: Mammographic images were obtained following ultrasound guided biopsy of a right breast mass and a right axillary lymph node. The biopsy marking clip is in expected position at the site of biopsy. IMPRESSION: A ribbon shaped clip is within the biopsied right breast mass. A HydroMARK clip is within a right axillary lymph node. Final Assessment: Post Procedure Mammograms for Marker Placement Electronically Signed   By: Dorise Bullion III M.D   On: 07/21/2019 14:28   [place] RT BREAST BX W LOC DEV 1ST LESION IMG BX SPEC US GUIDE  Addendum Date: 07/23/2019   ADDENDUM REPORT: 07/22/2019 15:04 ADDENDUM: Pathology  revealed POORLY DIFFERENTIATED GRADE III INVASIVE DUCTAL CARCINOMA WITH METAPLASTIC FEATURES of the RIGHT breast, 1 o'clock. This was found to be concordant by Dr. Dorise Bullion. Pathology revealed BENIGN LYMPH NODE of the RIGHT axilla. This was found to be concordant by Dr. Dorise Bullion. Pathology results were discussed with the patient by telephone. The patient reported doing well after the biopsies with tenderness at the sites. Post biopsy instructions and care were reviewed and questions were answered. The patient was encouraged to call [facility] for any additional concerns. The patient was referred to [facility] on July 30, 2019. Pathology results reported by Stacie Acres RN on 07/22/2019. Electronically Signed   By: Dorise Bullion III M.D   On: 07/22/2019 15:04   Result Date: 07/23/2019 CLINICAL DATA:  Biopsy of a right breast mass and a right axillary lymph node. EXAM: ULTRASOUND GUIDED RIGHT BREAST CORE NEEDLE BIOPSY COMPARISON:  Previous exam(s). PROCEDURE: I met with the patient and we discussed the procedure of ultrasound-guided biopsy, including benefits and alternatives. We discussed the high likelihood of a successful procedure. We discussed the risks of the procedure, including infection, bleeding, tissue injury, clip migration, and inadequate sampling. Informed written consent was given. The usual time-out protocol was performed immediately prior to the procedure. Lesion quadrant: 1 o'clock Using sterile technique and 1% Lidocaine as local anesthetic, under direct ultrasound visualization, a 12 gauge spring-loaded device was used to perform biopsy of a 1 o'clock right breast mass using a lateral approach. At the conclusion of the procedure tissue marker clip was deployed into the biopsy cavity. Follow up 2 view mammogram was performed and dictated separately. I met with the patient and we  discussed the procedure of ultrasound-guided biopsy, including benefits and alternatives. We discussed the high likelihood of a successful procedure. We discussed the risks of the procedure, including infection, bleeding, tissue injury, clip migration, and inadequate sampling. Informed written consent was given. The usual time-out protocol was performed immediately prior to the procedure. Lesion quadrant: A right axillary lymph node Using sterile technique and 1% Lidocaine as local anesthetic, under direct ultrasound visualization, a 14 gauge spring-loaded device was used to perform biopsy of right axillary lymph node using a lateral approach. At the conclusion of the procedure a HydroMARK tissue marker clip was deployed into the biopsy cavity. Follow up 2 view mammogram was performed and dictated separately.  IMPRESSION: Ultrasound guided biopsy of a right breast mass and a right axillary lymph node. No apparent complications. Electronically Signed: By: Dorise Bullion III M.D On: 07/21/2019 14:27     ELIGIBLE FOR AVAILABLE RESEARCH PROTOCOL: AET  ASSESSMENT: 60 y.o. 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  (2) definitive surgery to follow  (3) adjuvant radiation to follow-up surgery as appropriate   PLAN: Lavinia did generally well with her first cycle of chemotherapy.  There are some things that I think we can do better.  If she start stool softeners and MiraLAX on day 1 and continues until the constipation induced by the premeds subsides, that will be helpful.  Secondly I  think she would do better with omeprazole than Pepcid and I have asked her to get that over-the-counter and take it nightly beginning the day of chemo and continuing for 5 days.  Finally she is doing very well with the promethazine but she can cut the dose in half which I think will cause less somnolence and be just as effective in her case.  My suspicion is that she did not get her Neulasta through the [facility] provider given her very low nadir today.  Sometimes however despite Neulasta patients have low nadirs but then to have a quick recovery.  The peripheral of the putting is going to be when she returns in a week from now.  If she is still neutropenic then then she definitely did not get the Neulasta and we will simply have to postpone her chemo 1 week.  In the meantime I am starting her on Cipro, to take twice daily for 5 days beginning today.  I asked her to call us if she develops a temperature greater than 100  Total encounter time 35 minutes.Sarajane Jews C. Amalio Loe, MD 08/15/2019 1:33 PM Medical Oncology and Hematology [facility] [place], [place] [address] Tel. [phone]    Fax. [phone]   This document serves as a record of services personally performed by Lurline Del, MD. It was created on his behalf by Wilburn Mylar, a trained medical scribe. The creation of this record is based on the scribe's personal observations and the provider's statements to them.   I, Lurline Del MD, have reviewed the above documentation for accuracy and completeness, and I agree with the above.   *Total Encounter Time as defined by the Centers for Medicare and Medicaid Services includes, in addition to the face-to-face time of a patient visit (documented in the note above) non-face-to-face time: obtaining and reviewing outside history, ordering and reviewing medications, tests or procedures, care coordination (communications with other health care professionals  or caregivers) and documentation in the medical record.

## 2019-08-15 ENCOUNTER — Ambulatory Visit: Payer: BC Managed Care – PPO | Admitting: Oncology

## 2019-08-15 ENCOUNTER — Other Ambulatory Visit: Payer: Self-pay

## 2019-08-15 ENCOUNTER — Inpatient Hospital Stay: Payer: BC Managed Care – PPO

## 2019-08-15 ENCOUNTER — Ambulatory Visit: Payer: BC Managed Care – PPO

## 2019-08-15 ENCOUNTER — Other Ambulatory Visit: Payer: BC Managed Care – PPO

## 2019-08-15 ENCOUNTER — Encounter: Payer: Self-pay | Admitting: *Deleted

## 2019-08-15 ENCOUNTER — Inpatient Hospital Stay: Payer: BC Managed Care – PPO | Admitting: Oncology

## 2019-08-15 VITALS — BP 132/71 | HR 69 | Temp 98.3°F | Resp 18 | Ht 69.5 in | Wt 199.1 lb

## 2019-08-15 DIAGNOSIS — C50211 Malignant neoplasm of upper-inner quadrant of right female breast: Secondary | ICD-10-CM | POA: Diagnosis not present

## 2019-08-15 DIAGNOSIS — Z170 Estrogen receptor positive status [ER+]: Secondary | ICD-10-CM

## 2019-08-15 DIAGNOSIS — D701 Agranulocytosis secondary to cancer chemotherapy: Secondary | ICD-10-CM | POA: Diagnosis not present

## 2019-08-15 DIAGNOSIS — T451X5A Adverse effect of antineoplastic and immunosuppressive drugs, initial encounter: Secondary | ICD-10-CM | POA: Insufficient documentation

## 2019-08-15 LAB — CBC WITH DIFFERENTIAL/PLATELET
Abs Immature Granulocytes: 0.01 10*3/uL (ref 0.00–0.07)
Basophils Absolute: 0 10*3/uL (ref 0.0–0.1)
Basophils Relative: 3 %
Eosinophils Absolute: 0.2 10*3/uL (ref 0.0–0.5)
Eosinophils Relative: 12 %
HCT: 38 % (ref 36.0–46.0)
Hemoglobin: 12.2 g/dL (ref 12.0–15.0)
Immature Granulocytes: 1 %
Lymphocytes Relative: 60 %
Lymphs Abs: 1 10*3/uL (ref 0.7–4.0)
MCH: 29.2 pg (ref 26.0–34.0)
MCHC: 32.1 g/dL (ref 30.0–36.0)
MCV: 90.9 fL (ref 80.0–100.0)
Monocytes Absolute: 0.2 10*3/uL (ref 0.1–1.0)
Monocytes Relative: 11 %
Neutro Abs: 0.2 10*3/uL — CL (ref 1.7–7.7)
Neutrophils Relative %: 13 %
Platelets: 174 10*3/uL (ref 150–400)
RBC: 4.18 MIL/uL (ref 3.87–5.11)
RDW: 12.2 % (ref 11.5–15.5)
WBC: 1.6 10*3/uL — ABNORMAL LOW (ref 4.0–10.5)
nRBC: 0 % (ref 0.0–0.2)

## 2019-08-15 LAB — COMPREHENSIVE METABOLIC PANEL WITH GFR
ALT: 20 U/L (ref 0–44)
AST: 13 U/L — ABNORMAL LOW (ref 15–41)
Albumin: 3.9 g/dL (ref 3.5–5.0)
Alkaline Phosphatase: 101 U/L (ref 38–126)
Anion gap: 6 (ref 5–15)
BUN: 16 mg/dL (ref 6–20)
CO2: 25 mmol/L (ref 22–32)
Calcium: 9.5 mg/dL (ref 8.9–10.3)
Chloride: 107 mmol/L (ref 98–111)
Creatinine, Ser: 0.72 mg/dL (ref 0.44–1.00)
GFR calc Af Amer: >60 mL/min
GFR calc non Af Amer: >60 mL/min
Glucose, Bld: 86 mg/dL (ref 70–99)
Potassium: 4.3 mmol/L (ref 3.5–5.1)
Sodium: 138 mmol/L (ref 135–145)
Total Bilirubin: 0.3 mg/dL (ref 0.3–1.2)
Total Protein: 7.1 g/dL (ref 6.5–8.1)

## 2019-08-15 MED ORDER — CIPROFLOXACIN HCL 500 MG PO TABS: 500.0000 mg | ORAL_TABLET | Freq: Two times a day (BID) | ORAL | 0 refills | Status: DC

## 2019-08-15 NOTE — Progress Notes (Signed)
Pharmacist Chemotherapy Monitoring - Follow Up Assessment    I verify that I have reviewed each item in the below checklist:  . Regimen for the patient is scheduled for the appropriate day and plan matches scheduled date. Marland Kitchen Appropriate non-routine labs are ordered dependent on drug ordered. . If applicable, additional medications reviewed and ordered per protocol based on lifetime cumulative doses and/or treatment regimen.   Plan for follow-up and/or issues identified: No . I-vent associated with next due treatment: No . MD and/or nursing notified: No  Davene Jobin K 08/15/2019 9:08 AM

## 2019-08-18 ENCOUNTER — Telehealth: Payer: Self-pay | Admitting: Oncology

## 2019-08-18 NOTE — Telephone Encounter (Signed)
No 4/9 los. No changes made to pt's calendar.

## 2019-08-20 NOTE — Progress Notes (Signed)
[place]  Telephone:([phone]) [phone] Fax:(336) [phone]     ID: Debra [name] DOB: 11/30/1960  MR#: [id number]  GBT#:[id number]  Patient Care Team: [name] as PCP - General (Family Medicine) [name], RN as Oncology Nurse Navigator [name], RN as Oncology Nurse Navigator [name], MD as Consulting Physician (General Surgery) Katharyn Schauer, [name], MD as Consulting Physician (Oncology) [name], MD as Consulting Physician (Radiation Oncology) [name], MD OTHER MD:  CHIEF COMPLAINT: functionally triple negative breast cancer  CURRENT TREATMENT: Neoadjuvant chemotherapy   INTERVAL HISTORY: Danyle returns today for follow up and treatment of her 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   She started on neoadjuvant chemotherapy of cyclophosphamide and doxorubicin in dose dense fashion x4 on 08/07/2019. Today is day 1 cycle 2.  She did very well with her first cycle but there were 2 issues.  First there was a discrepancy in the orders I gave her and the one she got from the chemo teaching nurse.  The chemo teaching nurse got the Decadron orders from my prescription and I got my prescription from the usual protocol but somebody had change the protocol from 2 tablets twice daily to 2 tablets once on day 2 and then twice daily the next 2 days.  Nevertheless Zenobia did very well with it is somewhat different protocol so I am not going to change it in her case.  The second issue is that her OnPro that we work so hard to get for her fell off and we were not sure whether she got it or not her nadir was very low, ANC 200, and I gave her Cipro for 5 days prophylactically.  I expected her counts today to be low, which is the reason I am seeing her, but they actually are adequate.  Accordingly we are proceeding with treatment on day 14 as planned   REVIEW OF SYSTEMS: Angalina continues to work full-time.  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  She has not yet began to lose her hair but she does have some scalp tingling.  She has a wig already available.  Aside from these issues a detailed review of systems today was stable   HISTORY OF CURRENT ILLNESS: From the original intake note:  Adina herself palpated a mass in the upper-inner right breast in February 2021.  It appeared to increase in size over the next 3 weeks. Physical exam performed at [facility] 07/15/2019 confirmed a 6 mm firm, oval, palpable mass in the upper-inner right breast. She underwent bilateral diagnostic mammography with tomography and bilateral breast ultrasonography on 07/15/2019 showing: breast density category C; 4.6 cm mass in the right breast at 1 o'clock; single abnormal-appearing lymph node; bilateral benign cysts.  Accordingly on 07/21/2019 she proceeded to biopsy of the right breast area in question. 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 Prognostic indicators significant for: estrogen receptor, 60% positive with weak staining intensity and progesterone receptor, 0% negative. Proliferation marker Ki67 at 90%. HER2 equivocal by immunohistochemistry (2+), but negative by fluorescent in situ hybridization with a signals ratio 1.24 and number per cell 2.55.  The questionable right axillary lymph node was biopsied as well and was benign (concordant).  The patient's subsequent history is as detailed  below.   PAST MEDICAL HISTORY: No past medical history on file.   PAST SURGICAL HISTORY: Past Surgical History:  Procedure Laterality Date  . ABDOMINAL HYSTERECTOMY    . LUNG BIOPSY    . PORTACATH PLACEMENT Left 08/06/2019   Procedure: INSERTION PORT-A-CATH WITH ULTRASOUND GUIDANCE;  Surgeon: [name], MD;  Location: [facility];  Service: General;  Laterality: Left;  Status post bilateral salpingo-oophorectomy   FAMILY HISTORY: Family History  Problem Relation Age of Onset  . Skin cancer Sister   . Colon cancer Paternal Grandfather   . Wilm's tumor Son     Her father is 49 years old as of 07/2019. Her mother was murdered at age 71 (domestic violence).  The patient has 3 sisters and no brothers. She reports a third cousin (grandmother's sister's daughter) with breast cancer in her 57's. She denies a family history of ovarian, prostate, or pancreatic cancer. She does report colon cancer in her paternal grandfather in his 67's, non-melanoma skin cancer in her sister in her 37's, and Wilms tumor in her son at 33 months.   GYNECOLOGIC HISTORY:  Patient's last menstrual period was 05/09/2011. Menarche: 91-27 years old Age at first live birth: 59 years old Ione P 3 LMP 2013 Contraceptive never used HRT used for approximately 9 months  Hysterectomy? Yes, 2013 BSO? 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 Husband [name] is an Art gallery manager. She lives at home with husband [name]. Daughter [name], age 25, is a poet and Pharmacist, hospital in [place], [place]. Son [name], age 74, is an Production designer, theatre/television/film in [place] in [place], [place]. Son Kynsli Haapala, age 31, is a Marketing executive working in Artist in [place], [place] (at the [employer]). Ethelyne has three grandchildren. She is a Media planner.    ADVANCED DIRECTIVES: In the absence of any documentation to the contrary, the patient's spouse is their HCPOA.    HEALTH MAINTENANCE: Social History   Tobacco Use  . Smoking status: Former Research scientist (life sciences)  . Smokeless tobacco: Never Used  Substance Use Topics  . Alcohol use: Yes  . Drug use: Never     Colonoscopy: none on file  PAP: none on file (s/p hysterectomy)  Bone density: n/a  (age)   No Known Allergies  Current Outpatient Medications  Medication Sig Dispense Refill  . ciprofloxacin (CIPRO) 500 MG tablet Take 1 tablet (500 mg total) by mouth 2 (two) times daily. Start one week after chemotherapy and continue for 5 days, then repeat next chemotherapy cycle. 40 tablet 0  . dexamethasone (DECADRON) 4 MG tablet Take 2 tablets by mouth daily starting the day after Carboplatin and Cytoxan x 3 days. Take with food. 30 tablet 1  . lidocaine-prilocaine (EMLA) cream Apply to affected area once 30 g 3  . loratadine (CLARITIN) 10 MG tablet Take 1 tablet (10 mg total) by mouth daily. 60 tablet 0  . LORazepam (ATIVAN) 0.5 MG tablet Take 1 tablet (0.5 mg total) by mouth at bedtime as needed (Nausea or vomiting). 30 tablet 0  . Multiple Vitamin (MULTI-VITAMIN DAILY PO) Take by mouth.    . prochlorperazine (COMPAZINE) 10 MG tablet Take 1 tablet (10 mg total) by mouth every 6 (six) hours as needed (Nausea or vomiting). 30 tablet 1  . traMADol (ULTRAM) 50 MG tablet Take 1 tablet (50 mg total) by mouth every 6 (six) hours as needed for moderate pain or severe pain.  20 tablet 0  . UNABLE TO FIND Omega Krill Oil     No current facility-administered medications for this visit.    OBJECTIVE: White woman in no acute distress  Vitals:   08/21/19 1405  BP: (!) 144/62  Pulse: 65  Resp: 20  Temp: 97.8 F (36.6 C)  SpO2: 100%     Body mass index is 29.9 kg/m.   Wt Readings from Last 3 Encounters:  08/21/19 205 lb 6.4 oz (93.2 kg)  08/15/19 199 lb 1.6 oz (90.3 kg)  08/07/19 205 lb 11.2 oz (93.3 kg)      ECOG FS:1 - Symptomatic but completely ambulatory  Sclerae unicteric, EOMs intact Wearing a mask No cervical or supraclavicular adenopathy Lungs no rales or rhonchi Heart regular rate and rhythm Abd soft, nontender, positive bowel sounds MSK no focal spinal tenderness, no upper extremity lymphedema Neuro: nonfocal, well oriented, appropriate affect Breasts: The mass in the  right breast is still firm, but seems slightly smaller.  There is no skin involvement or nipple involvement.  The left breast and both axillae are benign.   LAB RESULTS:  CMP     Component Value Date/Time   NA 138 08/15/2019 1016   K 4.3 08/15/2019 1016   CL 107 08/15/2019 1016   CO2 25 08/15/2019 1016   GLUCOSE 86 08/15/2019 1016   BUN 16 08/15/2019 1016   CREATININE 0.72 08/15/2019 1016   CREATININE 0.76 07/30/2019 0825   CALCIUM 9.5 08/15/2019 1016   PROT 7.1 08/15/2019 1016   ALBUMIN 3.9 08/15/2019 1016   AST 13 (L) 08/15/2019 1016   AST 14 (L) 07/30/2019 0825   ALT 20 08/15/2019 1016   ALT 11 07/30/2019 0825   ALKPHOS 101 08/15/2019 1016   BILITOT 0.3 08/15/2019 1016   BILITOT 0.4 07/30/2019 0825   GFRNONAA >60 08/15/2019 1016   GFRNONAA >60 07/30/2019 0825   GFRAA >60 08/15/2019 1016   GFRAA >60 07/30/2019 0825    No results found for: TOTALPROTELP, ALBUMINELP, A1GS, A2GS, BETS, BETA2SER, GAMS, MSPIKE, SPEI  Lab Results  Component Value Date   WBC 6.9 08/21/2019   NEUTROABS 4.3 08/21/2019   HGB 11.3 (L) 08/21/2019   HCT 34.5 (L) 08/21/2019   MCV 88.5 08/21/2019   PLT 308 08/21/2019    No results found for: LABCA2  No components found for: [id number]  No results for input(s): INR in the last 168 hours.  No results found for: LABCA2  No results found for: SWH675  No results found for: FFM384  No results found for: YKZ993  No results found for: CA2729  No components found for: HGQUANT  No results found for: CEA1 / No results found for: CEA1   No results found for: AFPTUMOR  No results found for: CHROMOGRNA  No results found for: KPAFRELGTCHN, LAMBDASER, KAPLAMBRATIO (kappa/lambda light chains)  No results found for: HGBA, HGBA2QUANT, HGBFQUANT, HGBSQUAN (Hemoglobinopathy evaluation)   No results found for: LDH  No results found for: IRON, TIBC, IRONPCTSAT (Iron and TIBC)  No results found for: FERRITIN  Urinalysis No results found  for: COLORURINE, APPEARANCEUR, LABSPEC, PHURINE, GLUCOSEU, HGBUR, BILIRUBINUR, KETONESUR, PROTEINUR, UROBILINOGEN, NITRITE, LEUKOCYTESUR   STUDIES: MR BREAST BILATERAL W WO CONTRAST INC CAD  Result Date: 08/11/2019 CLINICAL DATA:  59 year old female with newly diagnosed right breast cancer. Biopsy of a right axillary lymph node was negative for metastatic disease. LABS:  None performed on site. EXAM: BILATERAL BREAST MRI WITH AND WITHOUT CONTRAST TECHNIQUE: Multiplanar, multisequence MR images of both  breasts were obtained prior to and following the intravenous administration of 10 ml of Gadavist. Three-dimensional MR images were rendered by post-processing of the original MR data on an independent workstation. The three-dimensional MR images were interpreted, and findings are reported in the following complete MRI report for this study. Three dimensional images were evaluated at the independent DynaCad workstation COMPARISON:  Previous exam(s). FINDINGS: Breast composition: b. Scattered fibroglandular tissue. Background parenchymal enhancement: Mild. Right breast: Susceptibility artifact from post biopsy clip is seen in association with an irregular, markedly enhancing mass in the upper inner right breast. This is consistent with the patient's biopsy-proven site of malignancy. It measures 5.0 x 4.0 x 4.0 cm (AP by transverse by craniocaudal dimensions). No additional suspicious mass or abnormal enhancement is identified in the remainder of the right breast. Left breast: No suspicious mass or abnormal enhancement. Multiple benign nonenhancing cysts are noted throughout the left breast. A circumscribed mass with associated susceptibility artifact, likely from associated calcification, is seen in the lower inner left breast at posterior depth. This demonstrates a mammographic correlate which is seen dating back to 2013, consistent with a benign etiology. Lymph nodes: No abnormal appearing lymph nodes. Ancillary  findings:  None. IMPRESSION: 1. 5 cm enhancing mass in the upper inner right breast consistent with the patient's biopsy-proven site of malignancy. No additional suspicious findings in the remainder of the right breast. 2. No MRI evidence of malignancy on the left. 3. No suspicious lymphadenopathy. RECOMMENDATION: Per clinical treatment plan. BI-RADS CATEGORY  6: Known biopsy-proven malignancy. Electronically Signed   By: Kristopher Oppenheim M.D.   On: 08/11/2019 09:08   DG Chest Port 1 View  Result Date: 08/06/2019 CLINICAL DATA:  Port-A-Cath placement.  History of breast carcinoma EXAM: PORTABLE CHEST 1 VIEW COMPARISON:  None. FINDINGS: Port-A-Cath tip is at the cavoatrial junction. No pneumothorax. Lungs are clear. Heart size and pulmonary vascularity are normal. No adenopathy. No bone lesions. IMPRESSION: Port-A-Cath tip at cavoatrial junction. No pneumothorax. Lungs clear. Cardiac silhouette within normal limits. Electronically Signed   By: Lowella Grip III M.D.   On: 08/06/2019 09:09   DG Fluoro Guide CV Line-No Report  Result Date: 08/06/2019 Fluoroscopy was utilized by the requesting physician.  No radiographic interpretation.   ECHOCARDIOGRAM COMPLETE  Result Date: 08/05/2019    ECHOCARDIOGRAM REPORT   Patient Name:   EMIRA EUBANKS Date of Exam: 08/05/2019 Medical Rec #:  [id number]       Height:       68.0 in Accession #:    [id number]      Weight:       200.0 lb Date of Birth:  07/29/1960       BSA:          2.044 m Patient Age:    67 years        BP:           119/76 mmHg Patient Gender: F               HR:           70 bpm. Exam Location:  Outpatient Procedure: 2D Echo, Cardiac Doppler, Color Doppler and Strain Analysis Indications:    Z51.11 Encounter for antineoplastic chemotheraphy  History:        Patient has no prior history of Echocardiogram examinations.  Sonographer:    Jonelle Sidle Dance Referring Phys: [place]  1. Left ventricular ejection fraction, by  estimation, is 60 to 65%. The left ventricle  has normal function. Left ventricular endocardial border not optimally defined to evaluate regional wall motion. Left ventricular diastolic parameters were normal.  2. Right ventricular systolic function is normal. The right ventricular size is normal. Tricuspid regurgitation signal is inadequate for assessing PA pressure.  3. The mitral valve is normal in structure. Trivial mitral valve regurgitation. No evidence of mitral stenosis.  4. The aortic valve is tricuspid. Aortic valve regurgitation is not visualized. No aortic stenosis is present.  5. The inferior vena cava is normal in size with greater than 50% respiratory variability, suggesting right atrial pressure of 3 mmHg. Comparison(s): No prior Echocardiogram. Conclusion(s)/Recommendation(s): Normal biventricular function without evidence of hemodynamically significant valvular heart disease. FINDINGS  Left Ventricle: Left ventricular ejection fraction, by estimation, is 60 to 65%. The left ventricle has normal function. Left ventricular endocardial border not optimally defined to evaluate regional wall motion. The left ventricular internal cavity size was normal in size. There is no left ventricular hypertrophy. Left ventricular diastolic parameters were normal. Right Ventricle: The right ventricular size is normal. No increase in right ventricular wall thickness. Right ventricular systolic function is normal. Tricuspid regurgitation signal is inadequate for assessing PA pressure. Left Atrium: Left atrial size was normal in size. Right Atrium: Right atrial size was normal in size. Pericardium: Trivial pericardial effusion is present. Mitral Valve: The mitral valve is normal in structure. Trivial mitral valve regurgitation. No evidence of mitral valve stenosis. Tricuspid Valve: The tricuspid valve is normal in structure. Tricuspid valve regurgitation is trivial. No evidence of tricuspid stenosis. Aortic Valve: The  aortic valve is tricuspid. Aortic valve regurgitation is not visualized. No aortic stenosis is present. Pulmonic Valve: The pulmonic valve was grossly normal. Pulmonic valve regurgitation is not visualized. No evidence of pulmonic stenosis. Aorta: The aortic root, ascending aorta and aortic arch are all structurally normal, with no evidence of dilitation or obstruction. Pulmonary Artery: The pulmonary artery is not well seen. Venous: The inferior vena cava is normal in size with greater than 50% respiratory variability, suggesting right atrial pressure of 3 mmHg. IAS/Shunts: No atrial level shunt detected by color flow Doppler.  LEFT VENTRICLE PLAX 2D LVIDd:         4.80 cm  Diastology LVIDs:         3.00 cm  LV e' lateral:   9.68 cm/s LV PW:         1.00 cm  LV E/e' lateral: 10.6 LV IVS:        1.00 cm  LV e' medial:    8.81 cm/s LVOT diam:     2.10 cm  LV E/e' medial:  11.7 LV SV:         84 LV SV Index:   41       2D Longitudinal Strain LVOT Area:     3.46 cm 2D Strain GLS (A2C):   -21.1 %                         2D Strain GLS (A3C):   -16.7 %                         2D Strain GLS (A4C):   -17.9 %                         2D Strain GLS Avg:     -18.6 % RIGHT VENTRICLE  IVC RV Basal diam:  3.50 cm     IVC diam: 1.70 cm RV Mid diam:    2.20 cm RV S prime:     12.90 cm/s TAPSE (M-mode): 2.5 cm LEFT ATRIUM             Index       RIGHT ATRIUM           Index LA diam:        3.60 cm 1.76 cm/m  RA Area:     19.20 cm LA Vol (A2C):   71.0 ml 34.74 ml/m RA Volume:   53.70 ml  26.28 ml/m LA Vol (A4C):   49.3 ml 24.12 ml/m LA Biplane Vol: 61.2 ml 29.95 ml/m  AORTIC VALVE LVOT Vmax:   110.00 cm/s LVOT Vmean:  70.100 cm/s LVOT VTI:    0.243 m  AORTA Ao Root diam: 3.50 cm Ao Asc diam:  3.40 cm MITRAL VALVE MV Area (PHT): 3.91 cm     SHUNTS MV Decel Time: 194 msec     Systemic VTI:  0.24 m MV E velocity: 103.00 cm/s  Systemic Diam: 2.10 cm MV A velocity: 86.60 cm/s MV E/A ratio:  1.19 Buford Dresser MD  Electronically signed by Buford Dresser MD Signature Date/Time: 08/05/2019/6:51:29 PM    Final      ELIGIBLE FOR AVAILABLE RESEARCH PROTOCOL: AET  ASSESSMENT: 59 y.o. 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  (2) definitive surgery to follow  (3) adjuvant radiation to follow-up surgery as appropriate   PLAN: Joreen may well have received the Neulasta through [facility] despite all the questions we have regarding that.  At any rate her white cell count today is adequate for treatment and we are proceeding with therapy.  She had no bony aches from the OnPro and if she did not get it she may get bony aches with this Neulasta coming up in 2 days.  She has Tylenol, Aleve, and some tramadol that she may use.  If that is not sufficient she will let us know.  We discussed the fact that her Decadron dosing was different than our usual because someone had made a change in the protocol.  This is being looked at by pharmacy.  Nevertheless since it worked well for her we are leaving it in place as before.  She will see [place] again in 2 weeks for cycle 3 and then she will see me again in 4 weeks for cycle 4 at that time I will set her up for her final 12 doses of carboplatin and paclitaxel  Total encounter time today 30 minutes.Sarajane Jews C. Ajayla Iglesias, MD 08/21/2019 2:45 PM Medical Oncology and Hematology [facility] [place], [place] [address] Tel. [phone]    Fax. [phone]   This document serves as a record of services personally  performed by Lurline Del, MD. It was created on his behalf by Wilburn Mylar, a trained medical scribe. The creation of this record is based on the scribe's personal observations and the provider's statements to them.   Lindie Spruce MD, have reviewed the above documentation  for accuracy and completeness, and I agree with the above.   *Total Encounter Time as defined by the Centers for Medicare and Medicaid Services includes, in addition to the face-to-face time of a patient visit (documented in the note above) non-face-to-face time: obtaining and reviewing outside history, ordering and reviewing medications, tests or procedures, care coordination (communications with other health care professionals or caregivers) and documentation in the medical record.

## 2019-08-21 ENCOUNTER — Inpatient Hospital Stay: Payer: BC Managed Care – PPO

## 2019-08-21 ENCOUNTER — Ambulatory Visit: Payer: BC Managed Care – PPO

## 2019-08-21 ENCOUNTER — Encounter: Payer: Self-pay | Admitting: Oncology

## 2019-08-21 ENCOUNTER — Inpatient Hospital Stay (HOSPITAL_BASED_OUTPATIENT_CLINIC_OR_DEPARTMENT_OTHER): Payer: BC Managed Care – PPO | Admitting: Oncology

## 2019-08-21 ENCOUNTER — Other Ambulatory Visit: Payer: BC Managed Care – PPO

## 2019-08-21 ENCOUNTER — Encounter: Payer: Self-pay | Admitting: *Deleted

## 2019-08-21 ENCOUNTER — Other Ambulatory Visit: Payer: Self-pay

## 2019-08-21 VITALS — BP 144/62 | HR 65 | Temp 97.8°F | Resp 20 | Ht 69.5 in | Wt 205.4 lb

## 2019-08-21 DIAGNOSIS — Z170 Estrogen receptor positive status [ER+]: Secondary | ICD-10-CM

## 2019-08-21 DIAGNOSIS — C50211 Malignant neoplasm of upper-inner quadrant of right female breast: Secondary | ICD-10-CM

## 2019-08-21 DIAGNOSIS — Z95828 Presence of other vascular implants and grafts: Secondary | ICD-10-CM

## 2019-08-21 LAB — COMPREHENSIVE METABOLIC PANEL
ALT: 25 U/L (ref 0–44)
AST: 22 U/L (ref 15–41)
Albumin: 3.7 g/dL (ref 3.5–5.0)
Alkaline Phosphatase: 102 U/L (ref 38–126)
Anion gap: 9 (ref 5–15)
BUN: 10 mg/dL (ref 6–20)
CO2: 23 mmol/L (ref 22–32)
Calcium: 8.9 mg/dL (ref 8.9–10.3)
Chloride: 110 mmol/L (ref 98–111)
Creatinine, Ser: 0.71 mg/dL (ref 0.44–1.00)
GFR calc Af Amer: >60 mL/min (ref >60)
GFR calc non Af Amer: >60 mL/min (ref >60)
Glucose, Bld: 119 mg/dL — ABNORMAL HIGH (ref 70–99)
Potassium: 3.7 mmol/L (ref 3.5–5.1)
Sodium: 142 mmol/L (ref 135–145)
Total Bilirubin: <0.2 mg/dL — ABNORMAL LOW (ref 0.3–1.2)
Total Protein: 6.8 g/dL (ref 6.5–8.1)

## 2019-08-21 LAB — CBC WITH DIFFERENTIAL/PLATELET
Abs Immature Granulocytes: 0.36 10*3/uL — ABNORMAL HIGH (ref 0.00–0.07)
Basophils Absolute: 0.1 10*3/uL (ref 0.0–0.1)
Basophils Relative: 1 %
Eosinophils Absolute: 0.1 10*3/uL (ref 0.0–0.5)
Eosinophils Relative: 1 %
HCT: 34.5 % — ABNORMAL LOW (ref 36.0–46.0)
Hemoglobin: 11.3 g/dL — ABNORMAL LOW (ref 12.0–15.0)
Immature Granulocytes: 5 %
Lymphocytes Relative: 23 %
Lymphs Abs: 1.6 10*3/uL (ref 0.7–4.0)
MCH: 29 pg (ref 26.0–34.0)
MCHC: 32.8 g/dL (ref 30.0–36.0)
MCV: 88.5 fL (ref 80.0–100.0)
Monocytes Absolute: 0.5 10*3/uL (ref 0.1–1.0)
Monocytes Relative: 8 %
Neutro Abs: 4.3 10*3/uL (ref 1.7–7.7)
Neutrophils Relative %: 62 %
Platelets: 308 10*3/uL (ref 150–400)
RBC: 3.9 MIL/uL (ref 3.87–5.11)
RDW: 12.8 % (ref 11.5–15.5)
WBC: 6.9 10*3/uL (ref 4.0–10.5)
nRBC: 0 % (ref 0.0–0.2)

## 2019-08-21 MED ORDER — SODIUM CHLORIDE 0.9 % IV SOLN
Freq: Once | INTRAVENOUS | Status: AC
  Filled 2019-08-21: qty 250

## 2019-08-21 MED ORDER — SODIUM CHLORIDE 0.9 % IV SOLN
10.0000 mg | Freq: Once | INTRAVENOUS | Status: AC
  Administered 2019-08-21: 10 mg via INTRAVENOUS
  Filled 2019-08-21: qty 10

## 2019-08-21 MED ORDER — SODIUM CHLORIDE 0.9% FLUSH
10.0000 mL | INTRAVENOUS | Status: DC | PRN
  Administered 2019-08-21: 10 mL
  Filled 2019-08-21: qty 10

## 2019-08-21 MED ORDER — HEPARIN SOD (PORK) LOCK FLUSH 100 UNIT/ML IV SOLN
500.0000 [IU] | Freq: Once | INTRAVENOUS | Status: AC | PRN
  Administered 2019-08-21: 500 [IU]
  Filled 2019-08-21: qty 5

## 2019-08-21 MED ORDER — SODIUM CHLORIDE 0.9 % IV SOLN
150.0000 mg | Freq: Once | INTRAVENOUS | Status: AC
  Administered 2019-08-21: 150 mg via INTRAVENOUS
  Filled 2019-08-21: qty 150

## 2019-08-21 MED ORDER — SODIUM CHLORIDE 0.9 % IV SOLN
600.0000 mg/m2 | Freq: Once | INTRAVENOUS | Status: AC
  Administered 2019-08-21: 1260 mg via INTRAVENOUS
  Filled 2019-08-21: qty 63

## 2019-08-21 MED ORDER — PALONOSETRON HCL INJECTION 0.25 MG/5ML
INTRAVENOUS | Status: AC
  Filled 2019-08-21: qty 5

## 2019-08-21 MED ORDER — PALONOSETRON HCL INJECTION 0.25 MG/5ML
0.2500 mg | Freq: Once | INTRAVENOUS | Status: AC
  Administered 2019-08-21: 0.25 mg via INTRAVENOUS

## 2019-08-21 MED ORDER — DOXORUBICIN HCL CHEMO IV INJECTION 2 MG/ML
60.0000 mg/m2 | Freq: Once | INTRAVENOUS | Status: AC
  Administered 2019-08-21: 126 mg via INTRAVENOUS
  Filled 2019-08-21: qty 63

## 2019-08-21 MED ORDER — SODIUM CHLORIDE 0.9% FLUSH
10.0000 mL | INTRAVENOUS | Status: DC | PRN
  Administered 2019-08-21: 10 mL via INTRAVENOUS
  Filled 2019-08-21: qty 10

## 2019-08-21 NOTE — Addendum Note (Signed)
Addended by: [name] on: 08/21/2019 03:15 PM   Modules accepted: Orders

## 2019-08-21 NOTE — Patient Instructions (Signed)

## 2019-08-21 NOTE — Patient Instructions (Signed)
[facility] Discharge Instructions for Patients Receiving Chemotherapy  Today you received the following chemotherapy agents Doxorubicin (ADRIAMYCIN) & Cyclophosphamide (CYTOXAN).  To help prevent nausea and vomiting after your treatment, we encourage you to take your nausea medication as prescribed.   If you develop nausea and vomiting that is not controlled by your nausea medication, call the clinic.   BELOW ARE SYMPTOMS THAT SHOULD BE REPORTED IMMEDIATELY:  *FEVER GREATER THAN 100.5 F  *CHILLS WITH OR WITHOUT FEVER  NAUSEA AND VOMITING THAT IS NOT CONTROLLED WITH YOUR NAUSEA MEDICATION  *UNUSUAL SHORTNESS OF BREATH  *UNUSUAL BRUISING OR BLEEDING  TENDERNESS IN MOUTH AND THROAT WITH OR WITHOUT PRESENCE OF ULCERS  *URINARY PROBLEMS  *BOWEL PROBLEMS  UNUSUAL RASH Items with * indicate a potential emergency and should be followed up as soon as possible.  Feel free to call the clinic should you have any questions or concerns. The clinic phone number is [phone].  Please show the CHEMO ALERT CARD at check-in to the Emergency Department and triage nurse.  Doxorubicin injection What is this medicine? DOXORUBICIN (dox oh ROO bi sin) is a chemotherapy drug. It is used to treat many kinds of cancer like leukemia, lymphoma, neuroblastoma, sarcoma, and Wilms' tumor. It is also used to treat bladder cancer, breast cancer, lung cancer, ovarian cancer, stomach cancer, and thyroid cancer. This medicine may be used for other purposes; ask your health care provider or pharmacist if you have questions. COMMON BRAND NAME(S): Adriamycin, Adriamycin PFS, Adriamycin RDF, Rubex What should I tell my health care provider before I take this medicine? They need to know if you have any of these conditions:  heart disease  history of low blood counts caused by a medicine  liver disease  recent or ongoing radiation therapy  an unusual or allergic reaction to doxorubicin,  other chemotherapy agents, other medicines, foods, dyes, or preservatives  pregnant or trying to get pregnant  breast-feeding How should I use this medicine? This drug is given as an infusion into a vein. It is administered in a hospital or clinic by a specially trained health care professional. If you have pain, swelling, burning or any unusual feeling around the site of your injection, tell your health care professional right away. Talk to your pediatrician regarding the use of this medicine in children. Special care may be needed. Overdosage: If you think you have taken too much of this medicine contact a poison control center or emergency room at once. NOTE: This medicine is only for you. Do not share this medicine with others. What if I miss a dose? It is important not to miss your dose. Call your doctor or health care professional if you are unable to keep an appointment. What may interact with this medicine? This medicine may interact with the following medications:  6-mercaptopurine  paclitaxel  phenytoin  St. John's Wort  trastuzumab  verapamil This list may not describe all possible interactions. Give your health care provider a list of all the medicines, herbs, non-prescription drugs, or dietary supplements you use. Also tell them if you smoke, drink alcohol, or use illegal drugs. Some items may interact with your medicine. What should I watch for while using this medicine? This drug may make you feel generally unwell. This is not uncommon, as chemotherapy can affect healthy cells as well as cancer cells. Report any side effects. Continue your course of treatment even though you feel ill unless your doctor tells you to stop. There is a   maximum amount of this medicine you should receive throughout your life. The amount depends on the medical condition being treated and your overall health. Your doctor will watch how much of this medicine you receive in your lifetime. Tell your  doctor if you have taken this medicine before. You may need blood work done while you are taking this medicine. Your urine may turn red for a few days after your dose. This is not blood. If your urine is dark or brown, call your doctor. In some cases, you may be given additional medicines to help with side effects. Follow all directions for their use. Call your doctor or health care professional for advice if you get a fever, chills or sore throat, or other symptoms of a cold or flu. Do not treat yourself. This drug decreases your body's ability to fight infections. Try to avoid being around people who are sick. This medicine may increase your risk to bruise or bleed. Call your doctor or health care professional if you notice any unusual bleeding. Talk to your doctor about your risk of cancer. You may be more at risk for certain types of cancers if you take this medicine. Do not become pregnant while taking this medicine or for 6 months after stopping it. Women should inform their doctor if they wish to become pregnant or think they might be pregnant. Men should not father a child while taking this medicine and for 6 months after stopping it. There is a potential for serious side effects to an unborn child. Talk to your health care professional or pharmacist for more information. Do not breast-feed an infant while taking this medicine. This medicine has caused ovarian failure in some women and reduced sperm counts in some men This medicine may interfere with the ability to have a child. Talk with your doctor or health care professional if you are concerned about your fertility. This medicine may cause a decrease in Co-Enzyme Q-10. You should make sure that you get enough Co-Enzyme Q-10 while you are taking this medicine. Discuss the foods you eat and the vitamins you take with your health care professional. What side effects may I notice from receiving this medicine? Side effects that you should report to  your doctor or health care professional as soon as possible:  allergic reactions like skin rash, itching or hives, swelling of the face, lips, or tongue  breathing problems  chest pain  fast or irregular heartbeat  low blood counts - this medicine may decrease the number of white blood cells, red blood cells and platelets. You may be at increased risk for infections and bleeding.  pain, redness, or irritation at site where injected  signs of infection - fever or chills, cough, sore throat, pain or difficulty passing urine  signs of decreased platelets or bleeding - bruising, pinpoint red spots on the skin, black, tarry stools, blood in the urine  swelling of the ankles, feet, hands  tiredness  weakness Side effects that usually do not require medical attention (report to your doctor or health care professional if they continue or are bothersome):  diarrhea  hair loss  mouth sores  nail discoloration or damage  nausea  red colored urine  vomiting This list may not describe all possible side effects. Call your doctor for medical advice about side effects. You may report side effects to FDA at 1-800-FDA-1088. Where should I keep my medicine? This drug is given in a hospital or clinic and will not be stored   at home. NOTE: This sheet is a summary. It may not cover all possible information. If you have questions about this medicine, talk to your doctor, pharmacist, or health care provider.  2020 Elsevier/Gold Standard (2016-12-06 11:01:26)  Cyclophosphamide Injection What is this medicine? CYCLOPHOSPHAMIDE (sye kloe FOSS fa mide) is a chemotherapy drug. It slows the growth of cancer cells. This medicine is used to treat many types of cancer like lymphoma, myeloma, leukemia, breast cancer, and ovarian cancer, to name a few. This medicine may be used for other purposes; ask your health care provider or pharmacist if you have questions. COMMON BRAND NAME(S): Cytoxan,  Neosar What should I tell my health care provider before I take this medicine? They need to know if you have any of these conditions:  heart disease  history of irregular heartbeat  infection  kidney disease  liver disease  low blood counts, like white cells, platelets, or red blood cells  on hemodialysis  recent or ongoing radiation therapy  scarring or thickening of the lungs  trouble passing urine  an unusual or allergic reaction to cyclophosphamide, other medicines, foods, dyes, or preservatives  pregnant or trying to get pregnant  breast-feeding How should I use this medicine? This drug is usually given as an injection into a vein or muscle or by infusion into a vein. It is administered in a hospital or clinic by a specially trained health care professional. Talk to your pediatrician regarding the use of this medicine in children. Special care may be needed. Overdosage: If you think you have taken too much of this medicine contact a poison control center or emergency room at once. NOTE: This medicine is only for you. Do not share this medicine with others. What if I miss a dose? It is important not to miss your dose. Call your doctor or health care professional if you are unable to keep an appointment. What may interact with this medicine?  amphotericin B  azathioprine  certain antivirals for HIV or hepatitis  certain medicines for blood pressure, heart disease, irregular heart beat  certain medicines that treat or prevent blood clots like warfarin  certain other medicines for cancer  cyclosporine  etanercept  indomethacin  medicines that relax muscles for surgery  medicines to increase blood counts  metronidazole This list may not describe all possible interactions. Give your health care provider a list of all the medicines, herbs, non-prescription drugs, or dietary supplements you use. Also tell them if you smoke, drink alcohol, or use illegal  drugs. Some items may interact with your medicine. What should I watch for while using this medicine? Your condition will be monitored carefully while you are receiving this medicine. You may need blood work done while you are taking this medicine. Drink water or other fluids as directed. Urinate often, even at night. Some products may contain alcohol. Ask your health care professional if this medicine contains alcohol. Be sure to tell all health care professionals you are taking this medicine. Certain medicines, like metronidazole and disulfiram, can cause an unpleasant reaction when taken with alcohol. The reaction includes flushing, headache, nausea, vomiting, sweating, and increased thirst. The reaction can last from 30 minutes to several hours. Do not become pregnant while taking this medicine or for 1 year after stopping it. Women should inform their health care professional if they wish to become pregnant or think they might be pregnant. Men should not father a child while taking this medicine and for 4 months after stopping it.   There is potential for serious side effects to an unborn child. Talk to your health care professional for more information. Do not breast-feed an infant while taking this medicine or for 1 week after stopping it. This medicine has caused ovarian failure in some women. This medicine may make it more difficult to get pregnant. Talk to your health care professional if you are concerned about your fertility. This medicine has caused decreased sperm counts in some men. This may make it more difficult to father a child. Talk to your health care professional if you are concerned about your fertility. Call your health care professional for advice if you get a fever, chills, or sore throat, or other symptoms of a cold or flu. Do not treat yourself. This medicine decreases your body's ability to fight infections. Try to avoid being around people who are sick. Avoid taking medicines  that contain aspirin, acetaminophen, ibuprofen, naproxen, or ketoprofen unless instructed by your health care professional. These medicines may hide a fever. Talk to your health care professional about your risk of cancer. You may be more at risk for certain types of cancer if you take this medicine. If you are going to need surgery or other procedure, tell your health care professional that you are using this medicine. Be careful brushing or flossing your teeth or using a toothpick because you may get an infection or bleed more easily. If you have any dental work done, tell your dentist you are receiving this medicine. What side effects may I notice from receiving this medicine? Side effects that you should report to your doctor or health care professional as soon as possible:  allergic reactions like skin rash, itching or hives, swelling of the face, lips, or tongue  breathing problems  nausea, vomiting  signs and symptoms of bleeding such as bloody or black, tarry stools; red or dark brown urine; spitting up blood or brown material that looks like coffee grounds; red spots on the skin; unusual bruising or bleeding from the eyes, gums, or nose  signs and symptoms of heart failure like fast, irregular heartbeat, sudden weight gain; swelling of the ankles, feet, hands  signs and symptoms of infection like fever; chills; cough; sore throat; pain or trouble passing urine  signs and symptoms of kidney injury like trouble passing urine or change in the amount of urine  signs and symptoms of liver injury like dark yellow or brown urine; general ill feeling or flu-like symptoms; light-colored stools; loss of appetite; nausea; right upper belly pain; unusually weak or tired; yellowing of the eyes or skin Side effects that usually do not require medical attention (report to your doctor or health care professional if they continue or are bothersome):  confusion  decreased  hearing  diarrhea  facial flushing  hair loss  headache  loss of appetite  missed menstrual periods  signs and symptoms of low red blood cells or anemia such as unusually weak or tired; feeling faint or lightheaded; falls  skin discoloration This list may not describe all possible side effects. Call your doctor for medical advice about side effects. You may report side effects to FDA at 1-800-FDA-1088. Where should I keep my medicine? This drug is given in a hospital or clinic and will not be stored at home. NOTE: This sheet is a summary. It may not cover all possible information. If you have questions about this medicine, talk to your doctor, pharmacist, or health care provider.  2020 Elsevier/Gold Standard (2019-01-27 09:53:29)  

## 2019-08-23 ENCOUNTER — Other Ambulatory Visit: Payer: Self-pay

## 2019-08-23 ENCOUNTER — Inpatient Hospital Stay: Payer: BC Managed Care – PPO

## 2019-08-23 VITALS — BP 136/66 | HR 60 | Temp 98.3°F | Resp 18

## 2019-08-23 DIAGNOSIS — C50211 Malignant neoplasm of upper-inner quadrant of right female breast: Secondary | ICD-10-CM | POA: Diagnosis not present

## 2019-08-23 MED ORDER — PEGFILGRASTIM-JMDB 6 MG/0.6ML ~~LOC~~ SOSY
6.0000 mg | PREFILLED_SYRINGE | Freq: Once | SUBCUTANEOUS | Status: AC
  Administered 2019-08-23: 6 mg via SUBCUTANEOUS

## 2019-08-23 NOTE — Patient Instructions (Signed)

## 2019-08-25 ENCOUNTER — Telehealth: Payer: Self-pay | Admitting: Oncology

## 2019-08-25 NOTE — Telephone Encounter (Signed)
No 4/15 los. No changes made to pt's schedule.  °

## 2019-08-29 NOTE — Progress Notes (Signed)
Pharmacist Chemotherapy Monitoring - Follow Up Assessment    I verify that I have reviewed each item in the below checklist:  . Regimen for the patient is scheduled for the appropriate day and plan matches scheduled date. Marland Kitchen Appropriate non-routine labs are ordered dependent on drug ordered. . If applicable, additional medications reviewed and ordered per protocol based on lifetime cumulative doses and/or treatment regimen.   Plan for follow-up and/or issues identified: No . I-vent associated with next due treatment: No . MD and/or nursing notified: No  Debra [name] D 08/29/2019 4:28 PM

## 2019-09-03 NOTE — Progress Notes (Signed)
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   She started on neoadjuvant chemotherapy of cyclophosphamide and doxorubicin in dose dense fashion x4 on 08/07/2019. Today is day 1 cycle 3.   REVIEW OF SYSTEMS: Debra [name] is doing well today.  Her only complaint is some stomach soreness and pain that isn't being relieved by Pepcid.  She wants to know if there is anything stronger that she can take.  She is mildly fatigued in the days following chemotherapy, but it isn't so severe that she has to miss work.  She is continuing to teach, and is teaching the remote students.    Debra [name] denies fever, chills, chest pain, palpitations, cough, shortness of breath, bowel/bladder changes, headaches, vision issues, or any other concerns.  A detailed ROS Was otherwise non contributory.     HISTORY OF CURRENT ILLNESS: From the original intake note:  Debra [name] herself palpated a mass in the upper-inner right breast in February 2021.  It appeared to increase in size over the next 3 weeks. Physical exam performed at [facility] 07/15/2019 confirmed a 6 mm firm, oval, palpable mass in the upper-inner right breast. She underwent bilateral diagnostic mammography with tomography and  bilateral breast ultrasonography on 07/15/2019 showing: breast density category C; 4.6 cm mass in the right breast at 1 o'clock; single abnormal-appearing lymph node; bilateral benign cysts.  Accordingly on 07/21/2019 she proceeded to biopsy of the right breast area in question. 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 Prognostic indicators significant for: estrogen receptor, 60% positive with weak staining intensity and progesterone receptor, 0% negative. Proliferation marker Ki67 at 90%. HER2 equivocal by immunohistochemistry (2+), but negative by fluorescent in situ hybridization with a signals ratio 1.24 and number per cell 2.55.  The questionable right axillary lymph node was biopsied as well and was benign (concordant).  The patient's subsequent history is as detailed below.   PAST MEDICAL HISTORY: No past medical history on file.   PAST SURGICAL HISTORY: Past Surgical History:  Procedure Laterality Date  . ABDOMINAL HYSTERECTOMY    . LUNG BIOPSY    . PORTACATH PLACEMENT Left 08/06/2019   Procedure: INSERTION PORT-A-CATH WITH ULTRASOUND GUIDANCE;  Surgeon: [name], MD;  Location: [facility];  Service: General;  Laterality: Left;  Status post bilateral salpingo-oophorectomy   FAMILY HISTORY: Family History  Problem Relation Age of Onset  . Skin cancer Sister   . Colon cancer Paternal Grandfather   . Wilm's tumor Son     Her father is 20 years old as of 07/2019. Her mother was murdered at age 40 (domestic violence).  The patient has 3 sisters and no brothers. She reports a third cousin (grandmother's sister's daughter) with breast cancer in her 48's. She denies a family history of ovarian, prostate, or pancreatic cancer. She does report  colon cancer in her paternal grandfather in his 46's, non-melanoma skin cancer in her sister in her 50's, and Wilms tumor in her son  at 8 months.   GYNECOLOGIC HISTORY:  Patient's last menstrual period was 05/09/2011. Menarche: 78-41 years old Age at first live birth: 59 years old [place] P 3 LMP 2013 Contraceptive never used HRT used for approximately 9 months  Hysterectomy? Yes, 2013 BSO? 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 Husband [name] is an Art gallery manager. She lives at home with husband Debra [name] Daughter Debra [name], age 16, is a poet and Pharmacist, hospital in [place], [place]. Son Debra [name], age 57, is an Production designer, theatre/television/film in [place] in [place], [place]. Son Debra [name], age 7, is a Marketing executive working in Artist in [place], [place] (at the [employer]). Debra [name] has three grandchildren. She is a Media planner.    ADVANCED DIRECTIVES: In the absence of any documentation to the contrary, the patient's spouse is their HCPOA.    HEALTH MAINTENANCE: Social History   Tobacco Use  . Smoking status: Former Research scientist (life sciences)  . Smokeless tobacco: Never Used  Substance Use Topics  . Alcohol use: Yes  . Drug use: Never     Colonoscopy: none on file  PAP: none on file (s/p hysterectomy)  Bone density: n/a (age)   No Known Allergies  Current Outpatient Medications  Medication Sig Dispense Refill  . ciprofloxacin (CIPRO) 500 MG tablet Take 1 tablet (500 mg total) by mouth 2 (two) times daily. Start one week after chemotherapy and continue for 5 days, then repeat next chemotherapy cycle. 40 tablet 0  . dexamethasone (DECADRON) 4 MG tablet Take 2 tablets by mouth daily starting the day after Carboplatin and Cytoxan x 3 days. Take with food. 30 tablet 1  . lidocaine-prilocaine (EMLA) cream Apply to affected area once 30 g 3  . loratadine (CLARITIN) 10 MG tablet Take 1 tablet (10 mg total) by mouth daily. 60 tablet 0  . LORazepam (ATIVAN) 0.5 MG tablet Take 1 tablet (0.5 mg total) by mouth at bedtime as needed (Nausea or vomiting). 30  tablet 0  . Multiple Vitamin (MULTI-VITAMIN DAILY PO) Take by mouth.    . prochlorperazine (COMPAZINE) 10 MG tablet Take 1 tablet (10 mg total) by mouth every 6 (six) hours as needed (Nausea or vomiting). 30 tablet 1  . traMADol (ULTRAM) 50 MG tablet Take 1 tablet (50 mg total) by mouth every 6 (six) hours as needed for moderate pain or severe pain. 20 tablet 0  . UNABLE TO FIND Omega Krill Oil     No current facility-administered medications for this visit.    OBJECTIVE: White woman in no acute distress  Vitals:   09/04/19 1341  BP: (!) 148/60  Pulse: 75  Resp: 18  Temp: 98 F (36.7 C)  SpO2: 100%     Body mass index is 29.52 kg/m.   Wt Readings from Last 3 Encounters:  09/04/19 202 lb 12.8 oz (92 kg)  08/21/19 205 lb 6.4 oz (93.2 kg)  08/15/19 199 lb 1.6 oz (90.3 kg)      ECOG FS:1 - Symptomatic but completely ambulatory GENERAL: Patient is a well appearing female in no acute distress HEENT:  Sclerae anicteric.  Mask in place. Neck is supple.  NODES:  No cervical, supraclavicular, or axillary lymphadenopathy palpated.  BREAST EXAM:  Difficulty palpating right breast mass LUNGS:  Clear to auscultation bilaterally.  No wheezes  or rhonchi. HEART:  Regular rate and rhythm. No murmur appreciated. ABDOMEN:  Soft, nontender.  Positive, normoactive bowel sounds. No organomegaly palpated. MSK:  No focal spinal tenderness to palpation. EXTREMITIES:  No peripheral edema.   SKIN:  Clear with no obvious rashes or skin changes. No nail dyscrasia. NEURO:  Nonfocal. Well oriented.  Appropriate affect.    LAB RESULTS:  CMP     Component Value Date/Time   NA 142 08/21/2019 1355   K 3.7 08/21/2019 1355   CL 110 08/21/2019 1355   CO2 23 08/21/2019 1355   GLUCOSE 119 (H) 08/21/2019 1355   BUN 10 08/21/2019 1355   CREATININE 0.71 08/21/2019 1355   CREATININE 0.76 07/30/2019 0825   CALCIUM 8.9 08/21/2019 1355   PROT 6.8 08/21/2019 1355   ALBUMIN 3.7 08/21/2019 1355   AST 22  08/21/2019 1355   AST 14 (L) 07/30/2019 0825   ALT 25 08/21/2019 1355   ALT 11 07/30/2019 0825   ALKPHOS 102 08/21/2019 1355   BILITOT <0.2 (L) 08/21/2019 1355   BILITOT 0.4 07/30/2019 0825   GFRNONAA >60 08/21/2019 1355   GFRNONAA >60 07/30/2019 0825   GFRAA >60 08/21/2019 1355   GFRAA >60 07/30/2019 0825    No results found for: TOTALPROTELP, ALBUMINELP, A1GS, A2GS, BETS, BETA2SER, GAMS, MSPIKE, SPEI  Lab Results  Component Value Date   WBC 10.9 (H) 09/04/2019   NEUTROABS 8.0 (H) 09/04/2019   HGB 10.7 (L) 09/04/2019   HCT 32.6 (L) 09/04/2019   MCV 88.8 09/04/2019   PLT 217 09/04/2019    No results found for: LABCA2  No components found for: YWVPXT062  No results for input(s): INR in the last 168 hours.  No results found for: LABCA2  No results found for: IRS854  No results found for: OEV035  No results found for: KKX381  No results found for: CA2729  No components found for: HGQUANT  No results found for: CEA1 / No results found for: CEA1   No results found for: AFPTUMOR  No results found for: CHROMOGRNA  No results found for: KPAFRELGTCHN, LAMBDASER, KAPLAMBRATIO (kappa/lambda light chains)  No results found for: HGBA, HGBA2QUANT, HGBFQUANT, HGBSQUAN (Hemoglobinopathy evaluation)   No results found for: LDH  No results found for: IRON, TIBC, IRONPCTSAT (Iron and TIBC)  No results found for: FERRITIN  Urinalysis No results found for: COLORURINE, APPEARANCEUR, LABSPEC, PHURINE, GLUCOSEU, HGBUR, BILIRUBINUR, KETONESUR, PROTEINUR, UROBILINOGEN, NITRITE, LEUKOCYTESUR   STUDIES: MR BREAST BILATERAL W WO CONTRAST INC CAD  Result Date: 08/11/2019 CLINICAL DATA:  59 year old female with newly diagnosed right breast cancer. Biopsy of a right axillary lymph node was negative for metastatic disease. LABS:  None performed on site. EXAM: BILATERAL BREAST MRI WITH AND WITHOUT CONTRAST TECHNIQUE: Multiplanar, multisequence MR images of both breasts were obtained  prior to and following the intravenous administration of 10 ml of Gadavist. Three-dimensional MR images were rendered by post-processing of the original MR data on an independent workstation. The three-dimensional MR images were interpreted, and findings are reported in the following complete MRI report for this study. Three dimensional images were evaluated at the independent DynaCad workstation COMPARISON:  Previous exam(s). FINDINGS: Breast composition: b. Scattered fibroglandular tissue. Background parenchymal enhancement: Mild. Right breast: Susceptibility artifact from post biopsy clip is seen in association with an irregular, markedly enhancing mass in the upper inner right breast. This is consistent with the patient's biopsy-proven site of malignancy. It measures 5.0 x 4.0 x 4.0 cm (AP by transverse by craniocaudal dimensions). No  additional suspicious mass or abnormal enhancement is identified in the remainder of the right breast. Left breast: No suspicious mass or abnormal enhancement. Multiple benign nonenhancing cysts are noted throughout the left breast. A circumscribed mass with associated susceptibility artifact, likely from associated calcification, is seen in the lower inner left breast at posterior depth. This demonstrates a mammographic correlate which is seen dating back to 2013, consistent with a benign etiology. Lymph nodes: No abnormal appearing lymph nodes. Ancillary findings:  None. IMPRESSION: 1. 5 cm enhancing mass in the upper inner right breast consistent with the patient's biopsy-proven site of malignancy. No additional suspicious findings in the remainder of the right breast. 2. No MRI evidence of malignancy on the left. 3. No suspicious lymphadenopathy. RECOMMENDATION: Per clinical treatment plan. BI-RADS CATEGORY  6: Known biopsy-proven malignancy. Electronically Signed   By: Kristopher Oppenheim M.D.   On: 08/11/2019 09:08   DG Chest Port 1 View  Result Date: 08/06/2019 CLINICAL DATA:   Port-A-Cath placement.  History of breast carcinoma EXAM: PORTABLE CHEST 1 VIEW COMPARISON:  None. FINDINGS: Port-A-Cath tip is at the cavoatrial junction. No pneumothorax. Lungs are clear. Heart size and pulmonary vascularity are normal. No adenopathy. No bone lesions. IMPRESSION: Port-A-Cath tip at cavoatrial junction. No pneumothorax. Lungs clear. Cardiac silhouette within normal limits. Electronically Signed   By: Lowella Grip III M.D.   On: 08/06/2019 09:09   DG Fluoro Guide CV Line-No Report  Result Date: 08/06/2019 Fluoroscopy was utilized by the requesting physician.  No radiographic interpretation.     ELIGIBLE FOR AVAILABLE RESEARCH PROTOCOL: AET  ASSESSMENT: 59 y.o. 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  (2) definitive surgery to follow  (3) adjuvant radiation to follow-up surgery as appropriate   PLAN: Debra [name] is doing well today.  She is tolerating her chemotherapy well and is continuing to work.  Her mass is softening nicely, and is difficult to palpate today.  She will proceed with her third cycle of doxorubicin and cyclophosphamide today (so long as CMET pending returns within parameters), and Fulphila on Saturday.    Debra [name] will get some Prevacid from the pharmacy over the counter to help with her stomach issues.  She will remain active as she has been.    We will see her back in 2 weeks for labs, f/u, and her fourth cycle of chemotherapy.  She was recommended to continue with the appropriate pandemic precautions. She knows to call for any  questions that may arise between now and her next appointment.  We are happy to see her sooner if needed.   Total encounter time today 20 minutes.Debra Bihari, NP 09/04/19 2:15 PM Medical Oncology and Hematology [facility] [place], [place] [address] Tel. [phone]    Fax. [phone]  *Total Encounter Time as defined by the Centers for Medicare and Medicaid Services includes, in addition to the face-to-face time of a patient visit (documented in the note above) non-face-to-face time: obtaining and reviewing outside history, ordering and reviewing medications, tests or procedures, care coordination (communications with other health care professionals or caregivers) and documentation in the medical record.

## 2019-09-04 ENCOUNTER — Inpatient Hospital Stay: Payer: BC Managed Care – PPO

## 2019-09-04 ENCOUNTER — Ambulatory Visit: Payer: BC Managed Care – PPO

## 2019-09-04 ENCOUNTER — Other Ambulatory Visit: Payer: Self-pay

## 2019-09-04 ENCOUNTER — Other Ambulatory Visit: Payer: BC Managed Care – PPO

## 2019-09-04 ENCOUNTER — Encounter: Payer: Self-pay | Admitting: Adult Health

## 2019-09-04 ENCOUNTER — Inpatient Hospital Stay (HOSPITAL_BASED_OUTPATIENT_CLINIC_OR_DEPARTMENT_OTHER): Payer: BC Managed Care – PPO | Admitting: Adult Health

## 2019-09-04 VITALS — BP 148/60 | HR 75 | Temp 98.0°F | Resp 18 | Ht 69.5 in | Wt 202.8 lb

## 2019-09-04 DIAGNOSIS — Z170 Estrogen receptor positive status [ER+]: Secondary | ICD-10-CM

## 2019-09-04 DIAGNOSIS — C50211 Malignant neoplasm of upper-inner quadrant of right female breast: Secondary | ICD-10-CM

## 2019-09-04 LAB — COMPREHENSIVE METABOLIC PANEL
ALT: 18 U/L (ref 0–44)
AST: 17 U/L (ref 15–41)
Albumin: 3.6 g/dL (ref 3.5–5.0)
Alkaline Phosphatase: 115 U/L (ref 38–126)
Anion gap: 8 (ref 5–15)
BUN: 15 mg/dL (ref 6–20)
CO2: 25 mmol/L (ref 22–32)
Calcium: 8.8 mg/dL — ABNORMAL LOW (ref 8.9–10.3)
Chloride: 109 mmol/L (ref 98–111)
Creatinine, Ser: 0.61 mg/dL (ref 0.44–1.00)
GFR calc Af Amer: >60 mL/min (ref >60)
GFR calc non Af Amer: >60 mL/min (ref >60)
Glucose, Bld: 114 mg/dL — ABNORMAL HIGH (ref 70–99)
Potassium: 3.7 mmol/L (ref 3.5–5.1)
Sodium: 142 mmol/L (ref 135–145)
Total Bilirubin: <0.2 mg/dL — ABNORMAL LOW (ref 0.3–1.2)
Total Protein: 6.7 g/dL (ref 6.5–8.1)

## 2019-09-04 LAB — CBC WITH DIFFERENTIAL/PLATELET
Abs Immature Granulocytes: 0.56 10*3/uL — ABNORMAL HIGH (ref 0.00–0.07)
Basophils Absolute: 0.1 10*3/uL (ref 0.0–0.1)
Basophils Relative: 1 %
Eosinophils Absolute: 0.1 10*3/uL (ref 0.0–0.5)
Eosinophils Relative: 1 %
HCT: 32.6 % — ABNORMAL LOW (ref 36.0–46.0)
Hemoglobin: 10.7 g/dL — ABNORMAL LOW (ref 12.0–15.0)
Immature Granulocytes: 5 %
Lymphocytes Relative: 12 %
Lymphs Abs: 1.4 10*3/uL (ref 0.7–4.0)
MCH: 29.2 pg (ref 26.0–34.0)
MCHC: 32.8 g/dL (ref 30.0–36.0)
MCV: 88.8 fL (ref 80.0–100.0)
Monocytes Absolute: 0.8 10*3/uL (ref 0.1–1.0)
Monocytes Relative: 8 %
Neutro Abs: 8 10*3/uL — ABNORMAL HIGH (ref 1.7–7.7)
Neutrophils Relative %: 73 %
Platelets: 217 10*3/uL (ref 150–400)
RBC: 3.67 MIL/uL — ABNORMAL LOW (ref 3.87–5.11)
RDW: 13.7 % (ref 11.5–15.5)
WBC: 10.9 10*3/uL — ABNORMAL HIGH (ref 4.0–10.5)
nRBC: 0 % (ref 0.0–0.2)

## 2019-09-04 MED ORDER — SODIUM CHLORIDE 0.9 % IV SOLN
10.0000 mg | Freq: Once | INTRAVENOUS | Status: AC
  Administered 2019-09-04: 10 mg via INTRAVENOUS
  Filled 2019-09-04: qty 10

## 2019-09-04 MED ORDER — HEPARIN SOD (PORK) LOCK FLUSH 100 UNIT/ML IV SOLN
500.0000 [IU] | Freq: Once | INTRAVENOUS | Status: AC | PRN
  Administered 2019-09-04: 500 [IU]
  Filled 2019-09-04: qty 5

## 2019-09-04 MED ORDER — PALONOSETRON HCL INJECTION 0.25 MG/5ML
INTRAVENOUS | Status: AC
  Filled 2019-09-04: qty 5

## 2019-09-04 MED ORDER — DOXORUBICIN HCL CHEMO IV INJECTION 2 MG/ML
60.0000 mg/m2 | Freq: Once | INTRAVENOUS | Status: AC
  Administered 2019-09-04: 126 mg via INTRAVENOUS
  Filled 2019-09-04: qty 63

## 2019-09-04 MED ORDER — SODIUM CHLORIDE 0.9 % IV SOLN
150.0000 mg | Freq: Once | INTRAVENOUS | Status: AC
  Administered 2019-09-04: 150 mg via INTRAVENOUS
  Filled 2019-09-04: qty 150

## 2019-09-04 MED ORDER — SODIUM CHLORIDE 0.9 % IV SOLN
Freq: Once | INTRAVENOUS | Status: AC
  Filled 2019-09-04: qty 250

## 2019-09-04 MED ORDER — SODIUM CHLORIDE 0.9 % IV SOLN
600.0000 mg/m2 | Freq: Once | INTRAVENOUS | Status: AC
  Administered 2019-09-04: 1260 mg via INTRAVENOUS
  Filled 2019-09-04: qty 63

## 2019-09-04 MED ORDER — SODIUM CHLORIDE 0.9% FLUSH
10.0000 mL | INTRAVENOUS | Status: DC | PRN
  Administered 2019-09-04: 10 mL
  Filled 2019-09-04: qty 10

## 2019-09-04 MED ORDER — PALONOSETRON HCL INJECTION 0.25 MG/5ML
0.2500 mg | Freq: Once | INTRAVENOUS | Status: AC
  Administered 2019-09-04: 0.25 mg via INTRAVENOUS

## 2019-09-04 NOTE — Patient Instructions (Addendum)
[facility] Discharge Instructions for Patients Receiving Chemotherapy  Today you received the following chemotherapy agents: Doxorubicin (ADRIAMYCIN) & Cyclophosphamide (CYTOXAN).  To help prevent nausea and vomiting after your treatment, we encourage you to take your nausea medication as directed.   If you develop nausea and vomiting that is not controlled by your nausea medication, call the clinic.   BELOW ARE SYMPTOMS THAT SHOULD BE REPORTED IMMEDIATELY:  *FEVER GREATER THAN 100.5 F  *CHILLS WITH OR WITHOUT FEVER  NAUSEA AND VOMITING THAT IS NOT CONTROLLED WITH YOUR NAUSEA MEDICATION  *UNUSUAL SHORTNESS OF BREATH  *UNUSUAL BRUISING OR BLEEDING  TENDERNESS IN MOUTH AND THROAT WITH OR WITHOUT PRESENCE OF ULCERS  *URINARY PROBLEMS  *BOWEL PROBLEMS  UNUSUAL RASH Items with * indicate a potential emergency and should be followed up as soon as possible.  Feel free to call the clinic should you have any questions or concerns. The clinic phone number is [phone].  Please show the [facility] at check-in to the Emergency Department and triage nurse.

## 2019-09-05 ENCOUNTER — Telehealth: Payer: Self-pay | Admitting: Adult Health

## 2019-09-05 NOTE — Telephone Encounter (Signed)
Scheduled appts per 4/29 los. Pt requested a print out of appt calendar at next visit. Request was put in appt notes.

## 2019-09-06 ENCOUNTER — Other Ambulatory Visit: Payer: Self-pay

## 2019-09-06 ENCOUNTER — Inpatient Hospital Stay: Payer: BC Managed Care – PPO | Attending: Oncology

## 2019-09-06 VITALS — BP 159/69 | HR 66 | Temp 98.0°F | Resp 20

## 2019-09-06 DIAGNOSIS — C50211 Malignant neoplasm of upper-inner quadrant of right female breast: Secondary | ICD-10-CM | POA: Insufficient documentation

## 2019-09-06 DIAGNOSIS — Z171 Estrogen receptor negative status [ER-]: Secondary | ICD-10-CM | POA: Diagnosis not present

## 2019-09-06 DIAGNOSIS — Z800 Family history of malignant neoplasm of digestive organs: Secondary | ICD-10-CM | POA: Insufficient documentation

## 2019-09-06 DIAGNOSIS — Z87891 Personal history of nicotine dependence: Secondary | ICD-10-CM | POA: Insufficient documentation

## 2019-09-06 DIAGNOSIS — Z7689 Persons encountering health services in other specified circumstances: Secondary | ICD-10-CM | POA: Insufficient documentation

## 2019-09-06 DIAGNOSIS — Z79899 Other long term (current) drug therapy: Secondary | ICD-10-CM | POA: Diagnosis not present

## 2019-09-06 DIAGNOSIS — Z5111 Encounter for antineoplastic chemotherapy: Secondary | ICD-10-CM | POA: Diagnosis not present

## 2019-09-06 DIAGNOSIS — Z803 Family history of malignant neoplasm of breast: Secondary | ICD-10-CM | POA: Diagnosis not present

## 2019-09-06 DIAGNOSIS — Z85828 Personal history of other malignant neoplasm of skin: Secondary | ICD-10-CM | POA: Diagnosis not present

## 2019-09-06 DIAGNOSIS — G629 Polyneuropathy, unspecified: Secondary | ICD-10-CM | POA: Insufficient documentation

## 2019-09-06 DIAGNOSIS — Z923 Personal history of irradiation: Secondary | ICD-10-CM | POA: Insufficient documentation

## 2019-09-06 DIAGNOSIS — Z9221 Personal history of antineoplastic chemotherapy: Secondary | ICD-10-CM | POA: Insufficient documentation

## 2019-09-06 DIAGNOSIS — Z170 Estrogen receptor positive status [ER+]: Secondary | ICD-10-CM

## 2019-09-06 MED ORDER — PEGFILGRASTIM-JMDB 6 MG/0.6ML ~~LOC~~ SOSY
6.0000 mg | PREFILLED_SYRINGE | Freq: Once | SUBCUTANEOUS | Status: AC
  Administered 2019-09-06: 6 mg via SUBCUTANEOUS

## 2019-09-06 NOTE — Patient Instructions (Signed)

## 2019-09-17 NOTE — Progress Notes (Signed)
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   She started on neoadjuvant chemotherapy of cyclophosphamide and doxorubicin in dose dense fashion x4 on 08/07/2019. Today is day 1 cycle 4.   REVIEW OF SYSTEMS: Debra [name] thought that the last treatment, her third cycle, was "rougher".  She was more tired.  She has had some peeling on the soles of her feet.  She had night sweats on the first night.  This is a new symptom for her.  She had very little nausea, no vomiting, no constipation.  Because of the peeling and a callus on her foot she has not been walking as much.  A detailed review of systems today was otherwise stable  HISTORY OF CURRENT ILLNESS: From the original intake note:  Debra [name] herself palpated a mass in the upper-inner right breast in February 2021.  It appeared to increase in size over the next 3 weeks. Physical exam performed at [facility] 07/15/2019 confirmed a 6 mm firm, oval, palpable mass in the upper-inner right breast. She underwent bilateral diagnostic mammography with tomography and bilateral breast ultrasonography on 07/15/2019 showing: breast density category C; 4.6 cm mass in the right breast at 1 o'clock; single abnormal-appearing lymph  node; bilateral benign cysts.  Accordingly on 07/21/2019 she proceeded to biopsy of the right breast area in question. 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 Prognostic indicators significant for: estrogen receptor, 60% positive with weak staining intensity and progesterone receptor, 0% negative. Proliferation marker Ki67 at 90%. HER2 equivocal by immunohistochemistry (2+), but negative by fluorescent in situ hybridization with a signals ratio 1.24 and number per cell 2.55.  The questionable right axillary lymph node was biopsied as well and was benign (concordant).  The patient's subsequent history is as detailed below.   PAST MEDICAL HISTORY: No past medical history on file.   PAST SURGICAL HISTORY: Past Surgical History:  Procedure Laterality Date  . ABDOMINAL HYSTERECTOMY    . LUNG BIOPSY    . PORTACATH PLACEMENT Left 08/06/2019   Procedure: INSERTION PORT-A-CATH WITH ULTRASOUND GUIDANCE;  Surgeon: [name], MD;  Location: [facility];  Service: General;  Laterality: Left;  Status post bilateral salpingo-oophorectomy   FAMILY HISTORY: Family History  Problem Relation Age of Onset  . Skin cancer Sister   . Colon cancer Paternal Grandfather   . Wilm's tumor Son     Her father is 66 years old as of 07/2019. Her mother was murdered at age 90 (domestic violence).  The patient has 3 sisters and no brothers. She reports a third cousin (grandmother's sister's daughter) with breast cancer in her 63's. She denies a family history of ovarian, prostate, or pancreatic cancer. She does report colon cancer in her paternal grandfather in his 70's, non-melanoma skin cancer in her sister in her 50's, and  Wilms tumor in her son at 47 months.   GYNECOLOGIC HISTORY:  Patient's last menstrual period was 05/09/2011. Menarche: 39-1 years old Age at first live birth: 59 years old [place]  P 3 LMP 2013 Contraceptive never used HRT used for approximately 9 months  Hysterectomy? Yes, 2013 BSO? 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 Husband Debra [name] is an Art gallery manager. She lives at home with husband Debra [name] Daughter Debra [name], age 35, is a poet and Pharmacist, hospital in [place], [place]. Son Debra [name], age 63, is an Production designer, theatre/television/film in [place] in [place], [place]. Son Debra [name], age 49, is a Marketing executive working in Artist in [place], [place] (at the [employer]). Debra [name] has three grandchildren. She is a Media planner.    ADVANCED DIRECTIVES: In the absence of any documentation to the contrary, the patient's spouse is their HCPOA.    HEALTH MAINTENANCE: Social History   Tobacco Use  . Smoking status: Former Research scientist (life sciences)  . Smokeless tobacco: Never Used  Substance Use Topics  . Alcohol use: Yes  . Drug use: Never     Colonoscopy: none on file  PAP: none on file (s/p hysterectomy)  Bone density: n/a (age)   No Known Allergies  Current Outpatient Medications  Medication Sig Dispense Refill  . ciprofloxacin (CIPRO) 500 MG tablet Take 1 tablet (500 mg total) by mouth 2 (two) times daily. Start one week after chemotherapy and continue for 5 days, then repeat next chemotherapy cycle. 40 tablet 0  . dexamethasone (DECADRON) 4 MG tablet Take 2 tablets by mouth daily starting the day after Carboplatin and Cytoxan x 3 days. Take with food. 30 tablet 1  . lidocaine-prilocaine (EMLA) cream Apply to affected area once 30 g 3  . loratadine (CLARITIN) 10 MG tablet Take 1 tablet (10 mg total) by mouth daily. 60 tablet 0  . LORazepam (ATIVAN) 0.5 MG tablet Take 1 tablet (0.5 mg total) by mouth at bedtime as needed (Nausea or vomiting). 30 tablet 0  . Multiple Vitamin (MULTI-VITAMIN DAILY PO) Take by mouth.    . prochlorperazine (COMPAZINE) 10 MG tablet Take 1 tablet (10 mg total) by mouth  every 6 (six) hours as needed (Nausea or vomiting). 30 tablet 1  . traMADol (ULTRAM) 50 MG tablet Take 1 tablet (50 mg total) by mouth every 6 (six) hours as needed for moderate pain or severe pain. 20 tablet 0  . UNABLE TO FIND Omega Krill Oil     No current facility-administered medications for this visit.    OBJECTIVE: White woman who appears stated age  59:   09/18/19 1413  BP: 136/66  Pulse: 73  Resp: 20  Temp: 98.5 F (36.9 C)  SpO2: 99%     Body mass index is 28.98 kg/m.   Wt Readings from Last 3 Encounters:  09/18/19 199 lb 1.6 oz (90.3 kg)  09/04/19 202 lb 12.8 oz (92 kg)  08/21/19 205 lb 6.4 oz (93.2 kg)      ECOG FS:1 - Symptomatic but completely ambulatory  Sclerae unicteric, EOMs intact Oropharynx wearing a mask No cervical or supraclavicular adenopathy Lungs no rales or rhonchi Heart regular rate and rhythm Abd soft, nontender, positive bowel sounds MSK no focal spinal tenderness, no upper extremity lymphedema Neuro: nonfocal, well oriented, appropriate affect Breasts: The mass in the right upper inner quadrant is still palpable but is about half the original size, now measuring at most 2 cm,  movable, without any skin or nipple involvement.  Left breast benign.  Both axillae are benign.    LAB RESULTS:  CMP     Component Value Date/Time   NA 142 09/18/2019 1355   K 4.1 09/18/2019 1355   CL 108 09/18/2019 1355   CO2 25 09/18/2019 1355   GLUCOSE 89 09/18/2019 1355   BUN 14 09/18/2019 1355   CREATININE 0.69 09/18/2019 1355   CREATININE 0.76 07/30/2019 0825   CALCIUM 9.3 09/18/2019 1355   PROT 6.6 09/18/2019 1355   ALBUMIN 3.5 09/18/2019 1355   AST 17 09/18/2019 1355   AST 14 (L) 07/30/2019 0825   ALT 15 09/18/2019 1355   ALT 11 07/30/2019 0825   ALKPHOS 103 09/18/2019 1355   BILITOT <0.2 (L) 09/18/2019 1355   BILITOT 0.4 07/30/2019 0825   GFRNONAA >60 09/18/2019 1355   GFRNONAA >60 07/30/2019 0825   GFRAA >60 09/18/2019 1355   GFRAA >60  07/30/2019 0825    No results found for: TOTALPROTELP, ALBUMINELP, A1GS, A2GS, BETS, BETA2SER, GAMS, MSPIKE, SPEI  Lab Results  Component Value Date   WBC 8.0 09/18/2019   NEUTROABS 5.7 09/18/2019   HGB 9.9 (L) 09/18/2019   HCT 30.7 (L) 09/18/2019   MCV 91.4 09/18/2019   PLT 268 09/18/2019    No results found for: LABCA2  No components found for: ZOXWRU045  No results for input(s): INR in the last 168 hours.  No results found for: LABCA2  No results found for: WUJ811  No results found for: BJY782  No results found for: NFA213  No results found for: CA2729  No components found for: HGQUANT  No results found for: CEA1 / No results found for: CEA1   No results found for: AFPTUMOR  No results found for: CHROMOGRNA  No results found for: KPAFRELGTCHN, LAMBDASER, KAPLAMBRATIO (kappa/lambda light chains)  No results found for: HGBA, HGBA2QUANT, HGBFQUANT, HGBSQUAN (Hemoglobinopathy evaluation)   No results found for: LDH  No results found for: IRON, TIBC, IRONPCTSAT (Iron and TIBC)  No results found for: FERRITIN  Urinalysis No results found for: COLORURINE, APPEARANCEUR, LABSPEC, PHURINE, GLUCOSEU, HGBUR, BILIRUBINUR, KETONESUR, PROTEINUR, UROBILINOGEN, NITRITE, LEUKOCYTESUR   STUDIES: No results found.   ELIGIBLE FOR AVAILABLE RESEARCH PROTOCOL: AET  ASSESSMENT: 59 y.o. 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  (2) definitive surgery to follow  (3) adjuvant radiation to follow-up surgery as appropriate   PLAN: Debra [name]  completes the more intense portion of her chemotherapy today.  She has done remarkably well, with no dose reductions or delays.  In fact she has been able to continue to work pretty much full-time right through the treatments.  I reassured her that having some peeling of her soles is not unusual and is temporary.  She can expect to be even a little bit more tired this time as compared with the last one but I think she will be quite recovered 2 weeks from now and if not we can always postpone the next treatments by 1 week.  I did alert her to the concern regarding peripheral neuropathy with paclitaxel which she will be receiving together with carboplatin 2  weeks from now.  She is going to see our nurse practitioner on day 1 of those treatment but I will see her with the second cycle and I have asked her to keep a diary of symptoms to facilitate troubleshooting at that time  Total encounter time 25 minutes.[name] Liandra Mendia MD  09/18/19 2:44 PM Medical Oncology and Hematology [facility] [place],  [address] Tel. [phone]    Fax. [phone]  *Total Encounter Time as defined by the Centers for Medicare and Medicaid Services includes, in addition to the face-to-face time of a patient visit (documented in the note above) non-face-to-face time: obtaining and reviewing outside history, ordering and reviewing medications, tests or procedures, care coordination (communications with other health care professionals or caregivers) and documentation in the medical record.

## 2019-09-18 ENCOUNTER — Encounter: Payer: Self-pay | Admitting: *Deleted

## 2019-09-18 ENCOUNTER — Inpatient Hospital Stay (HOSPITAL_BASED_OUTPATIENT_CLINIC_OR_DEPARTMENT_OTHER): Payer: BC Managed Care – PPO | Admitting: Oncology

## 2019-09-18 ENCOUNTER — Inpatient Hospital Stay: Payer: BC Managed Care – PPO

## 2019-09-18 ENCOUNTER — Other Ambulatory Visit: Payer: Self-pay

## 2019-09-18 ENCOUNTER — Other Ambulatory Visit: Payer: BC Managed Care – PPO

## 2019-09-18 ENCOUNTER — Ambulatory Visit: Payer: BC Managed Care – PPO

## 2019-09-18 VITALS — BP 136/66 | HR 73 | Temp 98.5°F | Resp 20 | Ht 69.5 in | Wt 199.1 lb

## 2019-09-18 DIAGNOSIS — C50211 Malignant neoplasm of upper-inner quadrant of right female breast: Secondary | ICD-10-CM

## 2019-09-18 DIAGNOSIS — Z170 Estrogen receptor positive status [ER+]: Secondary | ICD-10-CM

## 2019-09-18 DIAGNOSIS — Z95828 Presence of other vascular implants and grafts: Secondary | ICD-10-CM

## 2019-09-18 LAB — CBC WITH DIFFERENTIAL/PLATELET
Abs Immature Granulocytes: 0.31 10*3/uL — ABNORMAL HIGH (ref 0.00–0.07)
Basophils Absolute: 0.1 10*3/uL (ref 0.0–0.1)
Basophils Relative: 2 %
Eosinophils Absolute: 0 10*3/uL (ref 0.0–0.5)
Eosinophils Relative: 0 %
HCT: 30.7 % — ABNORMAL LOW (ref 36.0–46.0)
Hemoglobin: 9.9 g/dL — ABNORMAL LOW (ref 12.0–15.0)
Immature Granulocytes: 4 %
Lymphocytes Relative: 12 %
Lymphs Abs: 1 10*3/uL (ref 0.7–4.0)
MCH: 29.5 pg (ref 26.0–34.0)
MCHC: 32.2 g/dL (ref 30.0–36.0)
MCV: 91.4 fL (ref 80.0–100.0)
Monocytes Absolute: 0.9 10*3/uL (ref 0.1–1.0)
Monocytes Relative: 12 %
Neutro Abs: 5.7 10*3/uL (ref 1.7–7.7)
Neutrophils Relative %: 70 %
Platelets: 268 10*3/uL (ref 150–400)
RBC: 3.36 MIL/uL — ABNORMAL LOW (ref 3.87–5.11)
RDW: 14.9 % (ref 11.5–15.5)
WBC: 8 10*3/uL (ref 4.0–10.5)
nRBC: 0 % (ref 0.0–0.2)

## 2019-09-18 LAB — COMPREHENSIVE METABOLIC PANEL
ALT: 15 U/L (ref 0–44)
AST: 17 U/L (ref 15–41)
Albumin: 3.5 g/dL (ref 3.5–5.0)
Alkaline Phosphatase: 103 U/L (ref 38–126)
Anion gap: 9 (ref 5–15)
BUN: 14 mg/dL (ref 6–20)
CO2: 25 mmol/L (ref 22–32)
Calcium: 9.3 mg/dL (ref 8.9–10.3)
Chloride: 108 mmol/L (ref 98–111)
Creatinine, Ser: 0.69 mg/dL (ref 0.44–1.00)
GFR calc Af Amer: >60 mL/min (ref >60)
GFR calc non Af Amer: >60 mL/min (ref >60)
Glucose, Bld: 89 mg/dL (ref 70–99)
Potassium: 4.1 mmol/L (ref 3.5–5.1)
Sodium: 142 mmol/L (ref 135–145)
Total Bilirubin: <0.2 mg/dL — ABNORMAL LOW (ref 0.3–1.2)
Total Protein: 6.6 g/dL (ref 6.5–8.1)

## 2019-09-18 MED ORDER — SODIUM CHLORIDE 0.9 % IV SOLN
10.0000 mg | Freq: Once | INTRAVENOUS | Status: AC
  Administered 2019-09-18: 10 mg via INTRAVENOUS
  Filled 2019-09-18: qty 10

## 2019-09-18 MED ORDER — SODIUM CHLORIDE 0.9 % IV SOLN
Freq: Once | INTRAVENOUS | Status: AC
  Filled 2019-09-18: qty 250

## 2019-09-18 MED ORDER — SODIUM CHLORIDE 0.9% FLUSH
10.0000 mL | INTRAVENOUS | Status: DC | PRN
  Administered 2019-09-18: 10 mL
  Filled 2019-09-18: qty 10

## 2019-09-18 MED ORDER — SODIUM CHLORIDE 0.9 % IV SOLN
600.0000 mg/m2 | Freq: Once | INTRAVENOUS | Status: AC
  Administered 2019-09-18: 1260 mg via INTRAVENOUS
  Filled 2019-09-18: qty 63

## 2019-09-18 MED ORDER — PALONOSETRON HCL INJECTION 0.25 MG/5ML
0.2500 mg | Freq: Once | INTRAVENOUS | Status: AC
  Administered 2019-09-18: 0.25 mg via INTRAVENOUS

## 2019-09-18 MED ORDER — HEPARIN SOD (PORK) LOCK FLUSH 100 UNIT/ML IV SOLN
500.0000 [IU] | Freq: Once | INTRAVENOUS | Status: AC | PRN
  Administered 2019-09-18: 500 [IU]
  Filled 2019-09-18: qty 5

## 2019-09-18 MED ORDER — SODIUM CHLORIDE 0.9% FLUSH
10.0000 mL | Freq: Once | INTRAVENOUS | Status: AC
  Administered 2019-09-18: 10 mL via INTRAVENOUS
  Filled 2019-09-18: qty 10

## 2019-09-18 MED ORDER — SODIUM CHLORIDE 0.9 % IV SOLN
150.0000 mg | Freq: Once | INTRAVENOUS | Status: AC
  Administered 2019-09-18: 150 mg via INTRAVENOUS
  Filled 2019-09-18: qty 150

## 2019-09-18 MED ORDER — PALONOSETRON HCL INJECTION 0.25 MG/5ML
INTRAVENOUS | Status: AC
  Filled 2019-09-18: qty 5

## 2019-09-18 MED ORDER — DOXORUBICIN HCL CHEMO IV INJECTION 2 MG/ML
60.0000 mg/m2 | Freq: Once | INTRAVENOUS | Status: AC
  Administered 2019-09-18: 126 mg via INTRAVENOUS
  Filled 2019-09-18: qty 63

## 2019-09-18 NOTE — Patient Instructions (Signed)
New Site Discharge Instructions for Patients Receiving Chemotherapy  Today you received the following chemotherapy agents: Doxorubicin (ADRIAMYCIN) & Cyclophosphamide (CYTOXAN).  To help prevent nausea and vomiting after your treatment, we encourage you to take your nausea medication as directed.   If you develop nausea and vomiting that is not controlled by your nausea medication, call the clinic.   BELOW ARE SYMPTOMS THAT SHOULD BE REPORTED IMMEDIATELY:  *FEVER GREATER THAN 100.5 F  *CHILLS WITH OR WITHOUT FEVER  NAUSEA AND VOMITING THAT IS NOT CONTROLLED WITH YOUR NAUSEA MEDICATION  *UNUSUAL SHORTNESS OF BREATH  *UNUSUAL BRUISING OR BLEEDING  TENDERNESS IN MOUTH AND THROAT WITH OR WITHOUT PRESENCE OF ULCERS  *URINARY PROBLEMS  *BOWEL PROBLEMS  UNUSUAL RASH Items with * indicate a potential emergency and should be followed up as soon as possible.  Feel free to call the clinic should you have any questions or concerns. The clinic phone number is [phone].  Please show the [place] at check-in to the Emergency Department and triage nurse.

## 2019-09-19 ENCOUNTER — Encounter: Payer: Self-pay | Admitting: *Deleted

## 2019-09-20 ENCOUNTER — Inpatient Hospital Stay: Payer: BC Managed Care – PPO

## 2019-09-20 ENCOUNTER — Other Ambulatory Visit: Payer: Self-pay

## 2019-09-20 VITALS — BP 117/77 | HR 69 | Temp 98.9°F | Resp 20

## 2019-09-20 DIAGNOSIS — C50211 Malignant neoplasm of upper-inner quadrant of right female breast: Secondary | ICD-10-CM | POA: Diagnosis not present

## 2019-09-20 MED ORDER — PEGFILGRASTIM-JMDB 6 MG/0.6ML ~~LOC~~ SOSY
6.0000 mg | PREFILLED_SYRINGE | Freq: Once | SUBCUTANEOUS | Status: AC
  Administered 2019-09-20: 6 mg via SUBCUTANEOUS

## 2019-09-20 NOTE — Patient Instructions (Signed)

## 2019-09-24 ENCOUNTER — Other Ambulatory Visit: Payer: Self-pay | Admitting: Oncology

## 2019-09-25 NOTE — Progress Notes (Signed)
Pharmacist Chemotherapy Monitoring - Follow Up Assessment    I verify that I have reviewed each item in the below checklist:  . Regimen for the patient is scheduled for the appropriate day and plan matches scheduled date. Marland Kitchen Appropriate non-routine labs are ordered dependent on drug ordered. . If applicable, additional medications reviewed and ordered per protocol based on lifetime cumulative doses and/or treatment regimen.   Plan for follow-up and/or issues identified: No . I-vent associated with next due treatment: No . MD and/or nursing notified: No  Tomas Schamp K 09/25/2019 11:15 AM

## 2019-10-01 ENCOUNTER — Inpatient Hospital Stay: Payer: BC Managed Care – PPO

## 2019-10-01 ENCOUNTER — Encounter: Payer: Self-pay | Admitting: Adult Health

## 2019-10-01 ENCOUNTER — Inpatient Hospital Stay (HOSPITAL_BASED_OUTPATIENT_CLINIC_OR_DEPARTMENT_OTHER): Payer: BC Managed Care – PPO | Admitting: Adult Health

## 2019-10-01 ENCOUNTER — Other Ambulatory Visit: Payer: Self-pay

## 2019-10-01 VITALS — BP 134/57 | HR 85 | Temp 98.2°F | Resp 18 | Ht 69.5 in | Wt 201.6 lb

## 2019-10-01 DIAGNOSIS — C50211 Malignant neoplasm of upper-inner quadrant of right female breast: Secondary | ICD-10-CM

## 2019-10-01 DIAGNOSIS — Z170 Estrogen receptor positive status [ER+]: Secondary | ICD-10-CM

## 2019-10-01 DIAGNOSIS — Z95828 Presence of other vascular implants and grafts: Secondary | ICD-10-CM

## 2019-10-01 LAB — CBC WITH DIFFERENTIAL/PLATELET
Abs Immature Granulocytes: 0.22 10*3/uL — ABNORMAL HIGH (ref 0.00–0.07)
Basophils Absolute: 0.1 10*3/uL (ref 0.0–0.1)
Basophils Relative: 1 %
Eosinophils Absolute: 0 10*3/uL (ref 0.0–0.5)
Eosinophils Relative: 0 %
HCT: 26.2 % — ABNORMAL LOW (ref 36.0–46.0)
Hemoglobin: 8.4 g/dL — ABNORMAL LOW (ref 12.0–15.0)
Immature Granulocytes: 3 %
Lymphocytes Relative: 10 %
Lymphs Abs: 0.7 10*3/uL (ref 0.7–4.0)
MCH: 29.9 pg (ref 26.0–34.0)
MCHC: 32.1 g/dL (ref 30.0–36.0)
MCV: 93.2 fL (ref 80.0–100.0)
Monocytes Absolute: 0.7 10*3/uL (ref 0.1–1.0)
Monocytes Relative: 9 %
Neutro Abs: 5.6 10*3/uL (ref 1.7–7.7)
Neutrophils Relative %: 77 %
Platelets: 209 10*3/uL (ref 150–400)
RBC: 2.81 MIL/uL — ABNORMAL LOW (ref 3.87–5.11)
RDW: 16.8 % — ABNORMAL HIGH (ref 11.5–15.5)
WBC: 7.2 10*3/uL (ref 4.0–10.5)
nRBC: 0 % (ref 0.0–0.2)

## 2019-10-01 LAB — COMPREHENSIVE METABOLIC PANEL
ALT: 14 U/L (ref 0–44)
AST: 14 U/L — ABNORMAL LOW (ref 15–41)
Albumin: 3.2 g/dL — ABNORMAL LOW (ref 3.5–5.0)
Alkaline Phosphatase: 97 U/L (ref 38–126)
Anion gap: 8 (ref 5–15)
BUN: 11 mg/dL (ref 6–20)
CO2: 24 mmol/L (ref 22–32)
Calcium: 8.7 mg/dL — ABNORMAL LOW (ref 8.9–10.3)
Chloride: 111 mmol/L (ref 98–111)
Creatinine, Ser: 0.61 mg/dL (ref 0.44–1.00)
GFR calc Af Amer: >60 mL/min (ref >60)
GFR calc non Af Amer: >60 mL/min (ref >60)
Glucose, Bld: 114 mg/dL — ABNORMAL HIGH (ref 70–99)
Potassium: 3.6 mmol/L (ref 3.5–5.1)
Sodium: 143 mmol/L (ref 135–145)
Total Bilirubin: <0.2 mg/dL — ABNORMAL LOW (ref 0.3–1.2)
Total Protein: 6.3 g/dL — ABNORMAL LOW (ref 6.5–8.1)

## 2019-10-01 MED ORDER — SODIUM CHLORIDE 0.9% FLUSH
10.0000 mL | INTRAVENOUS | Status: DC | PRN
  Administered 2019-10-01: 10 mL via INTRAVENOUS
  Filled 2019-10-01: qty 10

## 2019-10-01 NOTE — Progress Notes (Addendum)
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   She has completed the first four cycles of her neoadjuvant chemotherapy with Doxorubicin and cyclophosphamide x 4 cycles.  She is now moving onto the second portion of her neoadjuvant chemotherapy regimen which is Paclitaxel and Carboplatin given weekly x 12 weeks.      REVIEW OF SYSTEMS: Debra [name] is feeling moderately well today.  She says that the past couple of weeks have been uncomfortable.  She notes that she had a rash on her hands and soreness on the soles of her feet during her last appointment with Dr. Jana Hakim.  After her appointment she took a hot bath, and rubbed her feet with a pumice stone over her calluses.  She subsequently developed increasingly painful blisters on her feet and had difficulty walking.  She took tramadol to help with the pain, and she had this leftover from her port placement.  She notes that she is fatigued.  She denies dizziness, headaches, or chest pain/palpitaitons.  A detailed ROS was otherwise non contributory.     HISTORY OF CURRENT ILLNESS: From the original intake note:  Debra [name] herself palpated a mass in the upper-inner right breast in February 2021.  It appeared to increase in  size over the next 3 weeks. Physical exam performed at [facility] 07/15/2019 confirmed a 6 mm firm, oval, palpable mass in the upper-inner right breast. She underwent bilateral diagnostic mammography with tomography and bilateral breast ultrasonography on 07/15/2019 showing: breast density category C; 4.6 cm mass in the right breast at 1 o'clock; single abnormal-appearing lymph node; bilateral benign cysts.  Accordingly on 07/21/2019 she proceeded to biopsy of the right breast area in question. 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 Prognostic indicators significant for: estrogen receptor, 60% positive with weak staining intensity and progesterone receptor, 0% negative. Proliferation marker Ki67 at 90%. HER2 equivocal by immunohistochemistry (2+), but negative by fluorescent in situ hybridization with a signals ratio 1.24 and number per cell 2.55.  The questionable right axillary lymph node was biopsied as well and was benign (concordant).  The patient's subsequent history is as detailed below.   PAST MEDICAL HISTORY: No past medical history on file.   PAST SURGICAL HISTORY: Past Surgical History:  Procedure Laterality Date  . ABDOMINAL HYSTERECTOMY    . LUNG BIOPSY    . PORTACATH PLACEMENT Left 08/06/2019   Procedure: INSERTION PORT-A-CATH WITH ULTRASOUND GUIDANCE;  Surgeon: [name], MD;  Location: [facility];  Service: General;  Laterality: Left;  Status post bilateral salpingo-oophorectomy   FAMILY HISTORY: Family History  Problem Relation Age of Onset  . Skin cancer Sister   . Colon cancer Paternal Grandfather   . Wilm's tumor Son     Her father is 41 years old as of 07/2019. Her mother was  murdered at age 71 (domestic violence).  The patient has 3 sisters and no brothers. She reports a third cousin (grandmother's sister's daughter) with breast cancer in her  52's. She denies a family history of ovarian, prostate, or pancreatic cancer. She does report colon cancer in her paternal grandfather in his 32's, non-melanoma skin cancer in her sister in her 6's, and Wilms tumor in her son at 5 months.   GYNECOLOGIC HISTORY:  Patient's last menstrual period was 05/09/2011. Menarche: 6-13 years old Age at first live birth: 59 years old Vander P 3 LMP 2013 Contraceptive never used HRT used for approximately 9 months  Hysterectomy? Yes, 2013 BSO? 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 Husband Debra [name] is an Art gallery manager. She lives at home with husband Debra [name] Daughter Debra [name], age 53, is a poet and Pharmacist, hospital in [place], [place]. Son Debra [name], age 100, is an Production designer, theatre/television/film in [place] in [place], [place]. Son Debra [name], age 24, is a Marketing executive working in Artist in [place], [place] (at the [employer]). Debra [name] has three grandchildren. She is a Media planner.    ADVANCED DIRECTIVES: In the absence of any documentation to the contrary, the patient's spouse is their HCPOA.    HEALTH MAINTENANCE: Social History   Tobacco Use  . Smoking status: Former Research scientist (life sciences)  . Smokeless tobacco: Never Used  Substance Use Topics  . Alcohol use: Yes  . Drug use: Never     Colonoscopy: none on file  PAP: none on file (s/p hysterectomy)  Bone density: n/a (age)   No Known Allergies  Current Outpatient Medications  Medication Sig Dispense Refill  . ciprofloxacin (CIPRO) 500 MG tablet Take 1 tablet (500 mg total) by mouth 2 (two) times daily. Start one week after chemotherapy and continue for 5 days, then repeat next chemotherapy cycle. 40 tablet 0  . dexamethasone (DECADRON) 4 MG tablet Take 2 tablets by mouth daily starting the day after Carboplatin and Cytoxan x 3 days. Take with food. 30 tablet 1  . lidocaine-prilocaine (EMLA) cream Apply to affected area  once 30 g 3  . loratadine (CLARITIN) 10 MG tablet TAKE 1 TABLET BY MOUTH EVERY DAY 60 tablet 0  . LORazepam (ATIVAN) 0.5 MG tablet Take 1 tablet (0.5 mg total) by mouth at bedtime as needed (Nausea or vomiting). 30 tablet 0  . Multiple Vitamin (MULTI-VITAMIN DAILY PO) Take by mouth.    . prochlorperazine (COMPAZINE) 10 MG tablet Take 1 tablet (10 mg total) by mouth every 6 (six) hours as needed (Nausea or vomiting). 30 tablet 1  . traMADol (ULTRAM) 50 MG tablet Take 1 tablet (50 mg total) by mouth every 6 (six) hours as needed for moderate pain or severe pain. 20 tablet 0  . UNABLE TO FIND Omega Krill Oil     No current facility-administered medications for this visit.    OBJECTIVE: White woman who appears stated age  There were no vitals filed for this visit.   There is no height or weight on file to calculate BMI.   Wt Readings from Last 3 Encounters:  09/18/19 199 lb 1.6 oz (90.3 kg)  09/04/19 202 lb 12.8 oz (92 kg)  08/21/19 205 lb 6.4 oz (93.2 kg)      ECOG FS:1 - Symptomatic but completely ambulatory GENERAL: Patient is a well appearing female in no acute distress HEENT:  Sclerae anicteric. Mask in place. Neck is supple.  NODES:  No cervical, supraclavicular, or axillary lymphadenopathy palpated.  BREAST EXAM:  Deferred. LUNGS:  Clear to auscultation bilaterally.  No wheezes or rhonchi. HEART:  Regular rate and rhythm. No murmur appreciated. ABDOMEN:  Soft, nontender.  Positive, normoactive bowel sounds. No organomegaly palpated. MSK:  No focal spinal tenderness to palpation. Full range of motion bilaterally in the upper extremities. EXTREMITIES:  No peripheral edema.   SKIN:  Clear with no obvious rashes or skin changes. No nail dyscrasia. Two small healing blisters on each of her soles.   NEURO:  Nonfocal. Well oriented.  Appropriate affect.      LAB RESULTS:  CMP     Component Value Date/Time   NA 142 09/18/2019 1355   K 4.1 09/18/2019 1355   CL 108 09/18/2019  1355   CO2 25 09/18/2019 1355   GLUCOSE 89 09/18/2019 1355   BUN 14 09/18/2019 1355   CREATININE 0.69 09/18/2019 1355   CREATININE 0.76 07/30/2019 0825   CALCIUM 9.3 09/18/2019 1355   PROT 6.6 09/18/2019 1355   ALBUMIN 3.5 09/18/2019 1355   AST 17 09/18/2019 1355   AST 14 (L) 07/30/2019 0825   ALT 15 09/18/2019 1355   ALT 11 07/30/2019 0825   ALKPHOS 103 09/18/2019 1355   BILITOT <0.2 (L) 09/18/2019 1355   BILITOT 0.4 07/30/2019 0825   GFRNONAA >60 09/18/2019 1355   GFRNONAA >60 07/30/2019 0825   GFRAA >60 09/18/2019 1355   GFRAA >60 07/30/2019 0825    No results found for: TOTALPROTELP, ALBUMINELP, A1GS, A2GS, BETS, BETA2SER, GAMS, MSPIKE, SPEI  Lab Results  Component Value Date   WBC 8.0 09/18/2019   NEUTROABS 5.7 09/18/2019   HGB 9.9 (L) 09/18/2019   HCT 30.7 (L) 09/18/2019   MCV 91.4 09/18/2019   PLT 268 09/18/2019    No results found for: LABCA2  No components found for: IDPOEU235  No results for input(s): INR in the last 168 hours.  No results found for: LABCA2  No results found for: TIR443  No results found for: XVQ008  No results found for: QPY195  No results found for: CA2729  No components found for: HGQUANT  No results found for: CEA1 / No results found for: CEA1   No results found for: AFPTUMOR  No results found for: CHROMOGRNA  No results found for: KPAFRELGTCHN, LAMBDASER, KAPLAMBRATIO (kappa/lambda light chains)  No results found for: HGBA, HGBA2QUANT, HGBFQUANT, HGBSQUAN (Hemoglobinopathy evaluation)   No results found for: LDH  No results found for: IRON, TIBC, IRONPCTSAT (Iron and TIBC)  No results found for: FERRITIN  Urinalysis No results found for: COLORURINE, APPEARANCEUR, LABSPEC, PHURINE, GLUCOSEU, HGBUR, BILIRUBINUR, KETONESUR, PROTEINUR, UROBILINOGEN, NITRITE, LEUKOCYTESUR   STUDIES: No results found.   ELIGIBLE FOR AVAILABLE RESEARCH PROTOCOL: AET  ASSESSMENT: 59 y.o. 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  (2) definitive surgery to follow  (3) adjuvant radiation to follow-up surgery as appropriate   PLAN: Debra [name] is doing moderately well today. Unfortunately, she had the issue of blistering of the soles of her feet bilaterally.  While they are almost healed, we really would like for them to be 100% resolved  before starting the Paclitaxel and Carboplatin.  She met with Dr. Jana Hakim and reviewed this with him, and she will wait one week to start Paclitaxel and Carboplatin.  I reviewed with her in extensive detail common adverse effects of Paclitaxel and Carboplatin, more specifically, peripheral neuropathy which we will monitor her closely for.  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  I placed a referral for this today.    Debra [name] hemoglobin is 8.4.  She is mildly symptomatic with fatigue, but not at the point where she can't deal with it.  I reviewed with her symptoms her hemoglobin is worse  We will see Debra [name] back in 1 week for labs, f/u, and to start Paclitaxel and Carboplatin.  She was recommended to continue with the appropriate pandemic precautions. She knows to call for any questions that may arise between now and her next appointment.  We are happy to see her sooner if needed.   Total encounter time 30 minutes.Wilber Bihari, NP 10/01/19 8:23 AM Medical Oncology and Hematology [facility] [place],  [place] [address] Tel. [phone]    Fax. [phone]   ADDENDUM: Debra [name] had a mild rash on the dorsum of her hands last visit.  I really did not think this was due to her chemotherapy and we proceeded with treatment.  However things did get worse.  I am still not exactly sure what the source of this rashes and I let Debra [name] know.  In any case it is getting better.  However it has not completely resolved.  I reassured her that taking a week off from these treatments (while adding it at the end) will not affect the ultimate result.  Accordingly she is not going to be treated today.  Hopefully she will be able to fully enjoy the coming long weekend (they do have family plans) and she will see [place] again next week for her next treatment cycle  I personally saw this patient and performed a substantive portion of this encounter with the listed APP documented above.   [name], MD Medical Oncology and Hematology [facility] [address] [place], [place] [address] Tel. [phone]    Fax. [phone]    *Total Encounter Time as defined by the Centers for Medicare and Medicaid Services includes, in addition to the face-to-face time of a patient visit (documented in the note above) non-face-to-face time: obtaining and reviewing outside history, ordering and reviewing medications, tests or procedures, care coordination (communications with other health care professionals or caregivers) and documentation in the medical record.

## 2019-10-02 ENCOUNTER — Encounter: Payer: Self-pay | Admitting: *Deleted

## 2019-10-02 ENCOUNTER — Telehealth: Payer: Self-pay | Admitting: Oncology

## 2019-10-02 ENCOUNTER — Telehealth: Payer: Self-pay | Admitting: Adult Health

## 2019-10-02 NOTE — Telephone Encounter (Signed)
No 5/26 los. No changes made to pt's schedule.  

## 2019-10-02 NOTE — Telephone Encounter (Signed)
Scheduled genetics on 6/22 per 5/26 sch message. Pt requested to r/s appts from 6/16 to 6/17. Unable to reach pt about appts moved to 6/17. Left voicemail but pt is aware of genetic appt.

## 2019-10-08 ENCOUNTER — Inpatient Hospital Stay (HOSPITAL_BASED_OUTPATIENT_CLINIC_OR_DEPARTMENT_OTHER): Payer: BC Managed Care – PPO | Admitting: Oncology

## 2019-10-08 ENCOUNTER — Inpatient Hospital Stay: Payer: BC Managed Care – PPO | Attending: Oncology

## 2019-10-08 ENCOUNTER — Inpatient Hospital Stay: Payer: BC Managed Care – PPO

## 2019-10-08 ENCOUNTER — Other Ambulatory Visit: Payer: Self-pay | Admitting: Oncology

## 2019-10-08 ENCOUNTER — Ambulatory Visit: Payer: BC Managed Care – PPO | Admitting: Adult Health

## 2019-10-08 ENCOUNTER — Other Ambulatory Visit: Payer: Self-pay

## 2019-10-08 ENCOUNTER — Encounter: Payer: Self-pay | Admitting: *Deleted

## 2019-10-08 VITALS — BP 118/80 | HR 74 | Temp 98.0°F | Resp 18 | Wt 203.3 lb

## 2019-10-08 DIAGNOSIS — R1030 Lower abdominal pain, unspecified: Secondary | ICD-10-CM | POA: Insufficient documentation

## 2019-10-08 DIAGNOSIS — Z5111 Encounter for antineoplastic chemotherapy: Secondary | ICD-10-CM | POA: Insufficient documentation

## 2019-10-08 DIAGNOSIS — Z87891 Personal history of nicotine dependence: Secondary | ICD-10-CM | POA: Diagnosis not present

## 2019-10-08 DIAGNOSIS — Z803 Family history of malignant neoplasm of breast: Secondary | ICD-10-CM | POA: Diagnosis not present

## 2019-10-08 DIAGNOSIS — Z800 Family history of malignant neoplasm of digestive organs: Secondary | ICD-10-CM | POA: Insufficient documentation

## 2019-10-08 DIAGNOSIS — C50211 Malignant neoplasm of upper-inner quadrant of right female breast: Secondary | ICD-10-CM

## 2019-10-08 DIAGNOSIS — Z170 Estrogen receptor positive status [ER+]: Secondary | ICD-10-CM

## 2019-10-08 DIAGNOSIS — Z95828 Presence of other vascular implants and grafts: Secondary | ICD-10-CM

## 2019-10-08 DIAGNOSIS — Z90710 Acquired absence of both cervix and uterus: Secondary | ICD-10-CM | POA: Insufficient documentation

## 2019-10-08 DIAGNOSIS — Z90722 Acquired absence of ovaries, bilateral: Secondary | ICD-10-CM | POA: Diagnosis not present

## 2019-10-08 DIAGNOSIS — Z171 Estrogen receptor negative status [ER-]: Secondary | ICD-10-CM | POA: Insufficient documentation

## 2019-10-08 DIAGNOSIS — Z809 Family history of malignant neoplasm, unspecified: Secondary | ICD-10-CM | POA: Insufficient documentation

## 2019-10-08 LAB — CBC WITH DIFFERENTIAL/PLATELET
Abs Immature Granulocytes: 0.07 10*3/uL (ref 0.00–0.07)
Basophils Absolute: 0.1 10*3/uL (ref 0.0–0.1)
Basophils Relative: 1 %
Eosinophils Absolute: 0.2 10*3/uL (ref 0.0–0.5)
Eosinophils Relative: 2 %
HCT: 27.7 % — ABNORMAL LOW (ref 36.0–46.0)
Hemoglobin: 8.9 g/dL — ABNORMAL LOW (ref 12.0–15.0)
Immature Granulocytes: 1 %
Lymphocytes Relative: 10 %
Lymphs Abs: 1 10*3/uL (ref 0.7–4.0)
MCH: 30.4 pg (ref 26.0–34.0)
MCHC: 32.1 g/dL (ref 30.0–36.0)
MCV: 94.5 fL (ref 80.0–100.0)
Monocytes Absolute: 1 10*3/uL (ref 0.1–1.0)
Monocytes Relative: 10 %
Neutro Abs: 7.6 10*3/uL (ref 1.7–7.7)
Neutrophils Relative %: 76 %
Platelets: 383 10*3/uL (ref 150–400)
RBC: 2.93 MIL/uL — ABNORMAL LOW (ref 3.87–5.11)
RDW: 17.3 % — ABNORMAL HIGH (ref 11.5–15.5)
WBC: 10 10*3/uL (ref 4.0–10.5)
nRBC: 0 % (ref 0.0–0.2)

## 2019-10-08 LAB — COMPREHENSIVE METABOLIC PANEL
ALT: 35 U/L (ref 0–44)
AST: 32 U/L (ref 15–41)
Albumin: 3.3 g/dL — ABNORMAL LOW (ref 3.5–5.0)
Alkaline Phosphatase: 90 U/L (ref 38–126)
Anion gap: 8 (ref 5–15)
BUN: 16 mg/dL (ref 6–20)
CO2: 23 mmol/L (ref 22–32)
Calcium: 8.8 mg/dL — ABNORMAL LOW (ref 8.9–10.3)
Chloride: 111 mmol/L (ref 98–111)
Creatinine, Ser: 0.66 mg/dL (ref 0.44–1.00)
GFR calc Af Amer: >60 mL/min (ref >60)
GFR calc non Af Amer: >60 mL/min (ref >60)
Glucose, Bld: 109 mg/dL — ABNORMAL HIGH (ref 70–99)
Potassium: 3.7 mmol/L (ref 3.5–5.1)
Sodium: 142 mmol/L (ref 135–145)
Total Bilirubin: <0.2 mg/dL — ABNORMAL LOW (ref 0.3–1.2)
Total Protein: 6.3 g/dL — ABNORMAL LOW (ref 6.5–8.1)

## 2019-10-08 LAB — SAMPLE TO BLOOD BANK

## 2019-10-08 MED ORDER — SODIUM CHLORIDE 0.9 % IV SOLN
Freq: Once | INTRAVENOUS | Status: AC
  Filled 2019-10-08: qty 250

## 2019-10-08 MED ORDER — FAMOTIDINE IN NACL 20-0.9 MG/50ML-% IV SOLN
INTRAVENOUS | Status: AC
  Filled 2019-10-08: qty 50

## 2019-10-08 MED ORDER — SODIUM CHLORIDE 0.9 % IV SOLN
150.0000 mg | Freq: Once | INTRAVENOUS | Status: AC
  Administered 2019-10-08: 150 mg via INTRAVENOUS
  Filled 2019-10-08: qty 150

## 2019-10-08 MED ORDER — PALONOSETRON HCL INJECTION 0.25 MG/5ML
INTRAVENOUS | Status: AC
  Filled 2019-10-08: qty 5

## 2019-10-08 MED ORDER — SODIUM CHLORIDE 0.9% FLUSH
10.0000 mL | INTRAVENOUS | Status: DC | PRN
  Administered 2019-10-08: 10 mL
  Filled 2019-10-08: qty 10

## 2019-10-08 MED ORDER — FAMOTIDINE IN NACL 20-0.9 MG/50ML-% IV SOLN
20.0000 mg | Freq: Once | INTRAVENOUS | Status: AC
  Administered 2019-10-08: 20 mg via INTRAVENOUS

## 2019-10-08 MED ORDER — DIPHENHYDRAMINE HCL 50 MG/ML IJ SOLN
25.0000 mg | Freq: Once | INTRAMUSCULAR | Status: AC
  Administered 2019-10-08: 25 mg via INTRAVENOUS

## 2019-10-08 MED ORDER — SODIUM CHLORIDE 0.9% FLUSH
10.0000 mL | INTRAVENOUS | Status: DC | PRN
  Administered 2019-10-08: 10 mL via INTRAVENOUS
  Filled 2019-10-08: qty 10

## 2019-10-08 MED ORDER — DIPHENHYDRAMINE HCL 50 MG/ML IJ SOLN
INTRAMUSCULAR | Status: AC
  Filled 2019-10-08: qty 1

## 2019-10-08 MED ORDER — SODIUM CHLORIDE 0.9 % IV SOLN
10.0000 mg | Freq: Once | INTRAVENOUS | Status: AC
  Administered 2019-10-08: 10 mg via INTRAVENOUS
  Filled 2019-10-08: qty 10

## 2019-10-08 MED ORDER — SODIUM CHLORIDE 0.9 % IV SOLN
80.0000 mg/m2 | Freq: Once | INTRAVENOUS | Status: AC
  Administered 2019-10-08: 168 mg via INTRAVENOUS
  Filled 2019-10-08: qty 28

## 2019-10-08 MED ORDER — PALONOSETRON HCL INJECTION 0.25 MG/5ML
0.2500 mg | Freq: Once | INTRAVENOUS | Status: AC
  Administered 2019-10-08: 0.25 mg via INTRAVENOUS

## 2019-10-08 MED ORDER — SODIUM CHLORIDE 0.9 % IV SOLN
273.2000 mg | Freq: Once | INTRAVENOUS | Status: AC
  Administered 2019-10-08: 270 mg via INTRAVENOUS
  Filled 2019-10-08: qty 27

## 2019-10-08 MED ORDER — HEPARIN SOD (PORK) LOCK FLUSH 100 UNIT/ML IV SOLN
500.0000 [IU] | Freq: Once | INTRAVENOUS | Status: AC | PRN
  Administered 2019-10-08: 500 [IU]
  Filled 2019-10-08: qty 5

## 2019-10-08 NOTE — Progress Notes (Signed)
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   She started on neoadjuvant chemotherapy of cyclophosphamide and doxorubicin in dose dense fashion x4 on 08/07/2019 and completed the planned 4 cycles on 09/18/2019. Today she starts the second portion of her chemotherapy, namely weekly carboplatin and paclitaxel x12   REVIEW OF SYSTEMS: Debra [name] tells me her feet are now completely recovered, with no peeling, no loss of sensation, no tingling, and no problems with walking.  She has no mouth sores, no cough or phlegm production, no change in bowel habits and is ready to start her new chemotherapy today.   HISTORY OF CURRENT ILLNESS: From the original intake note:  Debra [name] herself palpated a mass in the upper-inner right breast in February 2021.  It appeared to increase in size over the next 3 weeks. Physical exam performed at [facility] 07/15/2019 confirmed a 6 mm firm, oval, palpable mass in the upper-inner right breast. She underwent bilateral diagnostic mammography with tomography and bilateral breast ultrasonography on 07/15/2019 showing: breast density category C; 4.6 cm mass in the right breast at 1 o'clock; single abnormal-appearing lymph node; bilateral  benign cysts.  Accordingly on 07/21/2019 she proceeded to biopsy of the right breast area in question. 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 Prognostic indicators significant for: estrogen receptor, 60% positive with weak staining intensity and progesterone receptor, 0% negative. Proliferation marker Ki67 at 90%. HER2 equivocal by immunohistochemistry (2+), but negative by fluorescent in situ hybridization with a signals ratio 1.24 and number per cell 2.55.  The questionable right axillary lymph node was biopsied as well and was benign (concordant).  The patient's subsequent history is as detailed below.   PAST MEDICAL HISTORY: No past medical history on file.   PAST SURGICAL HISTORY: Past Surgical History:  Procedure Laterality Date  . ABDOMINAL HYSTERECTOMY    . LUNG BIOPSY    . PORTACATH PLACEMENT Left 08/06/2019   Procedure: INSERTION PORT-A-CATH WITH ULTRASOUND GUIDANCE;  Surgeon: [name], MD;  Location: [facility];  Service: General;  Laterality: Left;  Status post bilateral salpingo-oophorectomy   FAMILY HISTORY: Family History  Problem Relation Age of Onset  . Skin cancer Sister   . Colon cancer Paternal Grandfather   . Wilm's tumor Son     Her father is 60 years old as of 07/2019. Her mother was murdered at age 106 (domestic violence).  The patient has 3 sisters and no brothers. She reports a third cousin (grandmother's sister's daughter) with breast cancer in her 43's. She denies a family history of ovarian, prostate, or pancreatic cancer. She does report colon cancer in her paternal grandfather in his 43's, non-melanoma skin cancer in her sister in her 31's, and Wilms tumor in her son at 24 months.   GYNECOLOGIC HISTORY:  Patient's last menstrual period  was 05/09/2011. Menarche: 60-89 years old Age at first live birth: 59 years old [place] P 3 LMP  2013 Contraceptive never used HRT used for approximately 9 months  Hysterectomy? Yes, 2013 BSO? 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 Husband [name] is an Art gallery manager. She lives at home with husband [name]. Daughter [name], age 47, is a poet and Pharmacist, hospital in [place], [place]. Son [name], age 35, is an Production designer, theatre/television/film in [place] in [place], [place]. Son [name], age 60, is a Marketing executive working in Artist in [place], [place] (at the [employer]). [name] has three grandchildren. She is a Media planner.    ADVANCED DIRECTIVES: In the absence of any documentation to the contrary, the patient's spouse is their HCPOA.    HEALTH MAINTENANCE: Social History   Tobacco Use  . Smoking status: Former Research scientist (life sciences)  . Smokeless tobacco: Never Used  Substance Use Topics  . Alcohol use: Yes  . Drug use: Never     Colonoscopy: none on file  PAP: none on file (s/p hysterectomy)  Bone density: n/a (age)   Allergies  Allergen Reactions  . Other Rash    Walnuts give her a rash Walnuts give her a rash Walnuts give her a rash Walnuts give her a rash     Current Outpatient Medications  Medication Sig Dispense Refill  . ciprofloxacin (CIPRO) 500 MG tablet Take 1 tablet (500 mg total) by mouth 2 (two) times daily. Start one week after chemotherapy and continue for 5 days, then repeat next chemotherapy cycle. 40 tablet 0  . dexamethasone (DECADRON) 4 MG tablet Take 2 tablets by mouth daily starting the day after Carboplatin and Cytoxan x 3 days. Take with food. 30 tablet 1  . lidocaine-prilocaine (EMLA) cream Apply to affected area once 30 g 3  . loratadine (CLARITIN) 10 MG tablet TAKE 1 TABLET BY MOUTH EVERY DAY 60 tablet 0  . LORazepam (ATIVAN) 0.5 MG tablet Take 1 tablet (0.5 mg total) by mouth at bedtime as needed (Nausea or vomiting). 30 tablet 0  . Multiple Vitamin (MULTI-VITAMIN  DAILY PO) Take by mouth.    . prochlorperazine (COMPAZINE) 10 MG tablet Take 1 tablet (10 mg total) by mouth every 6 (six) hours as needed (Nausea or vomiting). 30 tablet 1  . traMADol (ULTRAM) 50 MG tablet Take 1 tablet (50 mg total) by mouth every 6 (six) hours as needed for moderate pain or severe pain. 20 tablet 0  . UNABLE TO FIND Omega Krill Oil     No current facility-administered medications for this visit.   Facility-Administered Medications Ordered in Other Visits  Medication Dose Route Frequency Provider Last Rate Last Admin  . CARBOplatin (PARAPLATIN) 270 mg in sodium chloride 0.9 % 250 mL chemo infusion  270 mg Intravenous Once Glorya Bartley, [name], MD 554 mL/hr at 10/08/19 1741 270 mg at 10/08/19 1741  . heparin lock flush 100 unit/mL  500 Units Intracatheter Once PRN Estanislado Surgeon, [name], MD      . sodium chloride flush (NS) 0.9 % injection 10 mL  10 mL Intracatheter PRN Juniel Groene, [name], MD        OBJECTIVE: White woman examined in the infusion area  There were no vitals filed for this visit.   There is no height or weight on file to calculate BMI.   Wt Readings from Last 3 Encounters:  10/08/19 203 lb 5 oz (92.2 kg)  10/01/19 201 lb 9.6 oz (91.4 kg)  09/18/19 199 lb 1.6 oz (90.3 kg)  For vitals today (10/08/2019) please see the infusion area flowsheet    ECOG FS:1 - Symptomatic but completely ambulatory    LAB RESULTS:  CMP     Component Value Date/Time   NA 142 10/08/2019 1245   K 3.7 10/08/2019 1245   CL 111 10/08/2019 1245   CO2 23 10/08/2019 1245   GLUCOSE 109 (H) 10/08/2019 1245   BUN 16 10/08/2019 1245   CREATININE 0.66 10/08/2019 1245   CREATININE 0.76 07/30/2019 0825   CALCIUM 8.8 (L) 10/08/2019 1245   PROT 6.3 (L) 10/08/2019 1245   ALBUMIN 3.3 (L) 10/08/2019 1245   AST 32 10/08/2019 1245   AST 14 (L) 07/30/2019 0825   ALT 35 10/08/2019 1245   ALT 11 07/30/2019 0825   ALKPHOS 90 10/08/2019 1245   BILITOT <0.2 (L) 10/08/2019 1245   BILITOT 0.4  07/30/2019 0825   GFRNONAA >60 10/08/2019 1245   GFRNONAA >60 07/30/2019 0825   GFRAA >60 10/08/2019 1245   GFRAA >60 07/30/2019 0825    No results found for: TOTALPROTELP, ALBUMINELP, A1GS, A2GS, BETS, BETA2SER, GAMS, MSPIKE, SPEI  Lab Results  Component Value Date   WBC 10.0 10/08/2019   NEUTROABS 7.6 10/08/2019   HGB 8.9 (L) 10/08/2019   HCT 27.7 (L) 10/08/2019   MCV 94.5 10/08/2019   PLT 383 10/08/2019    No results found for: LABCA2  No components found for: XVQMGQ676  No results for input(s): INR in the last 168 hours.  No results found for: LABCA2  No results found for: PPJ093  No results found for: OIZ124  No results found for: PYK998  No results found for: CA2729  No components found for: HGQUANT  No results found for: CEA1 / No results found for: CEA1   No results found for: AFPTUMOR  No results found for: CHROMOGRNA  No results found for: KPAFRELGTCHN, LAMBDASER, KAPLAMBRATIO (kappa/lambda light chains)  No results found for: HGBA, HGBA2QUANT, HGBFQUANT, HGBSQUAN (Hemoglobinopathy evaluation)   No results found for: LDH  No results found for: IRON, TIBC, IRONPCTSAT (Iron and TIBC)  No results found for: FERRITIN  Urinalysis No results found for: COLORURINE, APPEARANCEUR, LABSPEC, PHURINE, GLUCOSEU, HGBUR, BILIRUBINUR, KETONESUR, PROTEINUR, UROBILINOGEN, NITRITE, LEUKOCYTESUR   STUDIES: No results found.   ELIGIBLE FOR AVAILABLE RESEARCH PROTOCOL: AET  ASSESSMENT: 59 y.o. 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  (2) definitive surgery to follow  (3) adjuvant radiation to follow-up surgery as appropriate   PLAN: Marabella did remarkably well with the first part of the chemotherapy which is the more intensive part.  She did have some rash on her hands and some peeling of the soles of her feet which have thankfully completely resolved.  She is not ready to start the carboplatin/paclitaxel portion of the treatment.  She has no baseline peripheral neuropathy.  We discussed possible side effects toxicities and complications with specific reference to first dose reactions.  Also we reviewed how to take her supportive medications.  Specifically she will not need dexamethasone at home.  She  will take promethazine only, with a dose tonight and another tomorrow morning and then as needed.  I asked her to keep a diary of symptoms so she can let me know next week what the problems were with this treatment and we can make subsequent treatments better  She knows to call for any other issues that may develop before that visit  Total encounter time 20 minutes.[name] Edelyn Heidel MD  10/08/19 5:58 PM Medical Oncology and Hematology [facility] [place], [place] [address] Tel. [phone]    Fax. [phone]   I, Wilburn Mylar, am acting as scribe for Dr. [name]. Aaren Atallah.  I, Lurline Del MD, have reviewed the above documentation for accuracy and completeness, and I agree with the above.    *Total Encounter Time as defined by the Centers for Medicare and Medicaid Services includes, in addition to the face-to-face time of a patient visit (documented in the note above) non-face-to-face time: obtaining and reviewing outside history, ordering and reviewing medications, tests or procedures, care coordination (communications with other health care professionals or caregivers) and documentation in the medical record.

## 2019-10-08 NOTE — Patient Instructions (Signed)
Paclitaxel injection What is this medicine? PACLITAXEL (PAK li TAX el) is a chemotherapy drug. It targets fast dividing cells, like cancer cells, and causes these cells to die. This medicine is used to treat ovarian cancer, breast cancer, lung cancer, Kaposi's sarcoma, and other cancers. This medicine may be used for other purposes; ask your health care provider or pharmacist if you have questions. COMMON BRAND NAME(S): Onxol, Taxol What should I tell my health care provider before I take this medicine? They need to know if you have any of these conditions:  history of irregular heartbeat  liver disease  low blood counts, like low white cell, platelet, or red cell counts  lung or breathing disease, like asthma  tingling of the fingers or toes, or other nerve disorder  an unusual or allergic reaction to paclitaxel, alcohol, polyoxyethylated castor oil, other chemotherapy, other medicines, foods, dyes, or preservatives  pregnant or trying to get pregnant  breast-feeding How should I use this medicine? This drug is given as an infusion into a vein. It is administered in a hospital or clinic by a specially trained health care professional. Talk to your pediatrician regarding the use of this medicine in children. Special care may be needed. Overdosage: If you think you have taken too much of this medicine contact a poison control center or emergency room at once. NOTE: This medicine is only for you. Do not share this medicine with others. What if I miss a dose? It is important not to miss your dose. Call your doctor or health care professional if you are unable to keep an appointment. What may interact with this medicine? Do not take this medicine with any of the following medications:  disulfiram  metronidazole This medicine may also interact with the following medications:  antiviral medicines for hepatitis, HIV or AIDS  certain antibiotics like erythromycin and  clarithromycin  certain medicines for fungal infections like ketoconazole and itraconazole  certain medicines for seizures like carbamazepine, phenobarbital, phenytoin  gemfibrozil  nefazodone  rifampin  St. John's wort This list may not describe all possible interactions. Give your health care provider a list of all the medicines, herbs, non-prescription drugs, or dietary supplements you use. Also tell them if you smoke, drink alcohol, or use illegal drugs. Some items may interact with your medicine. What should I watch for while using this medicine? Your condition will be monitored carefully while you are receiving this medicine. You will need important blood work done while you are taking this medicine. This medicine can cause serious allergic reactions. To reduce your risk you will need to take other medicine(s) before treatment with this medicine. If you experience allergic reactions like skin rash, itching or hives, swelling of the face, lips, or tongue, tell your doctor or health care professional right away. In some cases, you may be given additional medicines to help with side effects. Follow all directions for their use. This drug may make you feel generally unwell. This is not uncommon, as chemotherapy can affect healthy cells as well as cancer cells. Report any side effects. Continue your course of treatment even though you feel ill unless your doctor tells you to stop. Call your doctor or health care professional for advice if you get a fever, chills or sore throat, or other symptoms of a cold or flu. Do not treat yourself. This drug decreases your body's ability to fight infections. Try to avoid being around people who are sick. This medicine may increase your risk to bruise   or bleed. Call your doctor or health care professional if you notice any unusual bleeding. Be careful brushing and flossing your teeth or using a toothpick because you may get an infection or bleed more easily.  If you have any dental work done, tell your dentist you are receiving this medicine. Avoid taking products that contain aspirin, acetaminophen, ibuprofen, naproxen, or ketoprofen unless instructed by your doctor. These medicines may hide a fever. Do not become pregnant while taking this medicine. Women should inform their doctor if they wish to become pregnant or think they might be pregnant. There is a potential for serious side effects to an unborn child. Talk to your health care professional or pharmacist for more information. Do not breast-feed an infant while taking this medicine. Men are advised not to father a child while receiving this medicine. This product may contain alcohol. Ask your pharmacist or healthcare provider if this medicine contains alcohol. Be sure to tell all healthcare providers you are taking this medicine. Certain medicines, like metronidazole and disulfiram, can cause an unpleasant reaction when taken with alcohol. The reaction includes flushing, headache, nausea, vomiting, sweating, and increased thirst. The reaction can last from 30 minutes to several hours. What side effects may I notice from receiving this medicine? Side effects that you should report to your doctor or health care professional as soon as possible:  allergic reactions like skin rash, itching or hives, swelling of the face, lips, or tongue  breathing problems  changes in vision  fast, irregular heartbeat  high or low blood pressure  mouth sores  pain, tingling, numbness in the hands or feet  signs of decreased platelets or bleeding - bruising, pinpoint red spots on the skin, black, tarry stools, blood in the urine  signs of decreased red blood cells - unusually weak or tired, feeling faint or lightheaded, falls  signs of infection - fever or chills, cough, sore throat, pain or difficulty passing urine  signs and symptoms of liver injury like dark yellow or brown urine; general ill feeling or  flu-like symptoms; light-colored stools; loss of appetite; nausea; right upper belly pain; unusually weak or tired; yellowing of the eyes or skin  swelling of the ankles, feet, hands  unusually slow heartbeat Side effects that usually do not require medical attention (report to your doctor or health care professional if they continue or are bothersome):  diarrhea  hair loss  loss of appetite  muscle or joint pain  nausea, vomiting  pain, redness, or irritation at site where injected  tiredness This list may not describe all possible side effects. Call your doctor for medical advice about side effects. You may report side effects to FDA at 1-800-FDA-1088. Where should I keep my medicine? This drug is given in a hospital or clinic and will not be stored at home. NOTE: This sheet is a summary. It may not cover all possible information. If you have questions about this medicine, talk to your doctor, pharmacist, or health care provider.  2020 Elsevier/Gold Standard (2016-12-26 13:14:55) Carboplatin injection What is this medicine? CARBOPLATIN (KAR boe pla tin) is a chemotherapy drug. It targets fast dividing cells, like cancer cells, and causes these cells to die. This medicine is used to treat ovarian cancer and many other cancers. This medicine may be used for other purposes; ask your health care provider or pharmacist if you have questions. COMMON BRAND NAME(S): Paraplatin What should I tell my health care provider before I take this medicine? They need to   know if you have any of these conditions:  blood disorders  hearing problems  kidney disease  recent or ongoing radiation therapy  an unusual or allergic reaction to carboplatin, cisplatin, other chemotherapy, other medicines, foods, dyes, or preservatives  pregnant or trying to get pregnant  breast-feeding How should I use this medicine? This drug is usually given as an infusion into a vein. It is administered in a  hospital or clinic by a specially trained health care professional. Talk to your pediatrician regarding the use of this medicine in children. Special care may be needed. Overdosage: If you think you have taken too much of this medicine contact a poison control center or emergency room at once. NOTE: This medicine is only for you. Do not share this medicine with others. What if I miss a dose? It is important not to miss a dose. Call your doctor or health care professional if you are unable to keep an appointment. What may interact with this medicine?  medicines for seizures  medicines to increase blood counts like filgrastim, pegfilgrastim, sargramostim  some antibiotics like amikacin, gentamicin, neomycin, streptomycin, tobramycin  vaccines Talk to your doctor or health care professional before taking any of these medicines:  acetaminophen  aspirin  ibuprofen  ketoprofen  naproxen This list may not describe all possible interactions. Give your health care provider a list of all the medicines, herbs, non-prescription drugs, or dietary supplements you use. Also tell them if you smoke, drink alcohol, or use illegal drugs. Some items may interact with your medicine. What should I watch for while using this medicine? Your condition will be monitored carefully while you are receiving this medicine. You will need important blood work done while you are taking this medicine. This drug may make you feel generally unwell. This is not uncommon, as chemotherapy can affect healthy cells as well as cancer cells. Report any side effects. Continue your course of treatment even though you feel ill unless your doctor tells you to stop. In some cases, you may be given additional medicines to help with side effects. Follow all directions for their use. Call your doctor or health care professional for advice if you get a fever, chills or sore throat, or other symptoms of a cold or flu. Do not treat  yourself. This drug decreases your body's ability to fight infections. Try to avoid being around people who are sick. This medicine may increase your risk to bruise or bleed. Call your doctor or health care professional if you notice any unusual bleeding. Be careful brushing and flossing your teeth or using a toothpick because you may get an infection or bleed more easily. If you have any dental work done, tell your dentist you are receiving this medicine. Avoid taking products that contain aspirin, acetaminophen, ibuprofen, naproxen, or ketoprofen unless instructed by your doctor. These medicines may hide a fever. Do not become pregnant while taking this medicine. Women should inform their doctor if they wish to become pregnant or think they might be pregnant. There is a potential for serious side effects to an unborn child. Talk to your health care professional or pharmacist for more information. Do not breast-feed an infant while taking this medicine. What side effects may I notice from receiving this medicine? Side effects that you should report to your doctor or health care professional as soon as possible:  allergic reactions like skin rash, itching or hives, swelling of the face, lips, or tongue  signs of infection - fever or   chills, cough, sore throat, pain or difficulty passing urine  signs of decreased platelets or bleeding - bruising, pinpoint red spots on the skin, black, tarry stools, nosebleeds  signs of decreased red blood cells - unusually weak or tired, fainting spells, lightheadedness  breathing problems  changes in hearing  changes in vision  chest pain  high blood pressure  low blood counts - This drug may decrease the number of white blood cells, red blood cells and platelets. You may be at increased risk for infections and bleeding.  nausea and vomiting  pain, swelling, redness or irritation at the injection site  pain, tingling, numbness in the hands or  feet  problems with balance, talking, walking  trouble passing urine or change in the amount of urine Side effects that usually do not require medical attention (report to your doctor or health care professional if they continue or are bothersome):  hair loss  loss of appetite  metallic taste in the mouth or changes in taste This list may not describe all possible side effects. Call your doctor for medical advice about side effects. You may report side effects to FDA at 1-800-FDA-1088. Where should I keep my medicine? This drug is given in a hospital or clinic and will not be stored at home. NOTE: This sheet is a summary. It may not cover all possible information. If you have questions about this medicine, talk to your doctor, pharmacist, or health care provider.  2020 Elsevier/Gold Standard (2007-07-30 14:38:05)  

## 2019-10-09 ENCOUNTER — Telehealth: Payer: Self-pay | Admitting: Oncology

## 2019-10-09 ENCOUNTER — Telehealth: Payer: Self-pay | Admitting: *Deleted

## 2019-10-09 NOTE — Progress Notes (Signed)
Pharmacist Chemotherapy Monitoring - Follow Up Assessment    I verify that I have reviewed each item in the below checklist:  . Regimen for the patient is scheduled for the appropriate day and plan matches scheduled date. Marland Kitchen Appropriate non-routine labs are ordered dependent on drug ordered. . If applicable, additional medications reviewed and ordered per protocol based on lifetime cumulative doses and/or treatment regimen.   Plan for follow-up and/or issues identified: No . I-vent associated with next due treatment: No . MD and/or nursing notified: No  Britt Boozer 10/09/2019 1:29 PM

## 2019-10-09 NOTE — Telephone Encounter (Signed)
Added office visit between already scheduled appts per 6/2 los.

## 2019-10-15 ENCOUNTER — Inpatient Hospital Stay: Payer: BC Managed Care – PPO

## 2019-10-15 ENCOUNTER — Inpatient Hospital Stay (HOSPITAL_BASED_OUTPATIENT_CLINIC_OR_DEPARTMENT_OTHER): Payer: BC Managed Care – PPO | Admitting: Oncology

## 2019-10-15 ENCOUNTER — Other Ambulatory Visit: Payer: Self-pay

## 2019-10-15 VITALS — BP 125/65 | HR 69 | Temp 98.2°F | Resp 20 | Wt 205.6 lb

## 2019-10-15 DIAGNOSIS — C50211 Malignant neoplasm of upper-inner quadrant of right female breast: Secondary | ICD-10-CM

## 2019-10-15 DIAGNOSIS — Z170 Estrogen receptor positive status [ER+]: Secondary | ICD-10-CM

## 2019-10-15 DIAGNOSIS — Z95828 Presence of other vascular implants and grafts: Secondary | ICD-10-CM

## 2019-10-15 LAB — CBC WITH DIFFERENTIAL/PLATELET
Abs Immature Granulocytes: 0.03 10*3/uL (ref 0.00–0.07)
Basophils Absolute: 0.1 10*3/uL (ref 0.0–0.1)
Basophils Relative: 2 %
Eosinophils Absolute: 0.2 10*3/uL (ref 0.0–0.5)
Eosinophils Relative: 3 %
HCT: 28.1 % — ABNORMAL LOW (ref 36.0–46.0)
Hemoglobin: 9 g/dL — ABNORMAL LOW (ref 12.0–15.0)
Immature Granulocytes: 0 %
Lymphocytes Relative: 16 %
Lymphs Abs: 1.1 10*3/uL (ref 0.7–4.0)
MCH: 30.7 pg (ref 26.0–34.0)
MCHC: 32 g/dL (ref 30.0–36.0)
MCV: 95.9 fL (ref 80.0–100.0)
Monocytes Absolute: 0.7 10*3/uL (ref 0.1–1.0)
Monocytes Relative: 10 %
Neutro Abs: 4.6 10*3/uL (ref 1.7–7.7)
Neutrophils Relative %: 69 %
Platelets: 357 10*3/uL (ref 150–400)
RBC: 2.93 MIL/uL — ABNORMAL LOW (ref 3.87–5.11)
RDW: 16.7 % — ABNORMAL HIGH (ref 11.5–15.5)
WBC: 6.7 10*3/uL (ref 4.0–10.5)
nRBC: 0 % (ref 0.0–0.2)

## 2019-10-15 LAB — COMPREHENSIVE METABOLIC PANEL
ALT: 39 U/L (ref 0–44)
AST: 30 U/L (ref 15–41)
Albumin: 3.4 g/dL — ABNORMAL LOW (ref 3.5–5.0)
Alkaline Phosphatase: 86 U/L (ref 38–126)
Anion gap: 9 (ref 5–15)
BUN: 18 mg/dL (ref 6–20)
CO2: 23 mmol/L (ref 22–32)
Calcium: 9.2 mg/dL (ref 8.9–10.3)
Chloride: 111 mmol/L (ref 98–111)
Creatinine, Ser: 0.62 mg/dL (ref 0.44–1.00)
GFR calc Af Amer: >60 mL/min (ref >60)
GFR calc non Af Amer: >60 mL/min (ref >60)
Glucose, Bld: 101 mg/dL — ABNORMAL HIGH (ref 70–99)
Potassium: 4 mmol/L (ref 3.5–5.1)
Sodium: 143 mmol/L (ref 135–145)
Total Bilirubin: <0.2 mg/dL — ABNORMAL LOW (ref 0.3–1.2)
Total Protein: 6.3 g/dL — ABNORMAL LOW (ref 6.5–8.1)

## 2019-10-15 LAB — SAMPLE TO BLOOD BANK

## 2019-10-15 MED ORDER — FAMOTIDINE IN NACL 20-0.9 MG/50ML-% IV SOLN
20.0000 mg | Freq: Once | INTRAVENOUS | Status: AC
  Administered 2019-10-15: 20 mg via INTRAVENOUS

## 2019-10-15 MED ORDER — SODIUM CHLORIDE 0.9% FLUSH
10.0000 mL | INTRAVENOUS | Status: DC | PRN
  Administered 2019-10-15: 10 mL via INTRAVENOUS
  Filled 2019-10-15: qty 10

## 2019-10-15 MED ORDER — HEPARIN SOD (PORK) LOCK FLUSH 100 UNIT/ML IV SOLN
500.0000 [IU] | Freq: Once | INTRAVENOUS | Status: AC | PRN
  Administered 2019-10-15: 500 [IU]
  Filled 2019-10-15: qty 5

## 2019-10-15 MED ORDER — SODIUM CHLORIDE 0.9 % IV SOLN
Freq: Once | INTRAVENOUS | Status: AC
  Filled 2019-10-15: qty 250

## 2019-10-15 MED ORDER — SODIUM CHLORIDE 0.9 % IV SOLN
10.0000 mg | Freq: Once | INTRAVENOUS | Status: AC
  Administered 2019-10-15: 10 mg via INTRAVENOUS
  Filled 2019-10-15: qty 10

## 2019-10-15 MED ORDER — PALONOSETRON HCL INJECTION 0.25 MG/5ML
INTRAVENOUS | Status: AC
  Filled 2019-10-15: qty 5

## 2019-10-15 MED ORDER — FAMOTIDINE IN NACL 20-0.9 MG/50ML-% IV SOLN
INTRAVENOUS | Status: AC
  Filled 2019-10-15: qty 50

## 2019-10-15 MED ORDER — SODIUM CHLORIDE 0.9 % IV SOLN
273.2000 mg | Freq: Once | INTRAVENOUS | Status: AC
  Administered 2019-10-15: 270 mg via INTRAVENOUS
  Filled 2019-10-15: qty 27

## 2019-10-15 MED ORDER — SODIUM CHLORIDE 0.9% FLUSH
10.0000 mL | INTRAVENOUS | Status: DC | PRN
  Administered 2019-10-15: 10 mL
  Filled 2019-10-15: qty 10

## 2019-10-15 MED ORDER — SODIUM CHLORIDE 0.9 % IV SOLN
150.0000 mg | Freq: Once | INTRAVENOUS | Status: AC
  Administered 2019-10-15: 150 mg via INTRAVENOUS
  Filled 2019-10-15: qty 150

## 2019-10-15 MED ORDER — DIPHENHYDRAMINE HCL 50 MG/ML IJ SOLN
25.0000 mg | Freq: Once | INTRAMUSCULAR | Status: AC
  Administered 2019-10-15: 25 mg via INTRAVENOUS

## 2019-10-15 MED ORDER — SODIUM CHLORIDE 0.9 % IV SOLN
80.0000 mg/m2 | Freq: Once | INTRAVENOUS | Status: AC
  Administered 2019-10-15: 168 mg via INTRAVENOUS
  Filled 2019-10-15: qty 28

## 2019-10-15 MED ORDER — PALONOSETRON HCL INJECTION 0.25 MG/5ML
0.2500 mg | Freq: Once | INTRAVENOUS | Status: AC
  Administered 2019-10-15: 0.25 mg via INTRAVENOUS

## 2019-10-15 MED ORDER — DIPHENHYDRAMINE HCL 50 MG/ML IJ SOLN
INTRAMUSCULAR | Status: AC
  Filled 2019-10-15: qty 1

## 2019-10-15 NOTE — Progress Notes (Signed)
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   She started on weekly carboplatin and paclitaxel on 10/08/2019. Today is week 2.   REVIEW OF SYSTEMS: Debra [name] did remarkably well with her first Taxol carbo treatment.  She had no problems with nausea.  She had a little bit of a stomach upset, from her description classically reflux.  And Prilosec is helping with that.  She did take Compazine and it does make her a little bit sleepy.  She feels like she has more energy as compared to the prior treatments.  A detailed review of systems today was otherwise stable.   HISTORY OF CURRENT ILLNESS: From the original intake note:  Debra [name] herself palpated a mass in the upper-inner right breast in February 2021.  It appeared to increase in size over the next 3 weeks. Physical exam performed at [facility] 07/15/2019 confirmed a 6 mm firm, oval, palpable mass in the upper-inner right breast. She underwent bilateral diagnostic mammography with tomography and bilateral breast ultrasonography on 07/15/2019 showing: breast density category C; 4.6 cm mass in the right breast at 1 o'clock; single abnormal-appearing lymph node; bilateral benign cysts.  Accordingly on 07/21/2019 she  proceeded to biopsy of the right breast area in question. 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 Prognostic indicators significant for: estrogen receptor, 60% positive with weak staining intensity and progesterone receptor, 0% negative. Proliferation marker Ki67 at 90%. HER2 equivocal by immunohistochemistry (2+), but negative by fluorescent in situ hybridization with a signals ratio 1.24 and number per cell 2.55.  The questionable right axillary lymph node was biopsied as well and was benign (concordant).  The patient's subsequent history is as detailed below.   PAST MEDICAL HISTORY: No past medical history on file.   PAST SURGICAL HISTORY: Past Surgical History:  Procedure Laterality Date  . ABDOMINAL HYSTERECTOMY    . LUNG BIOPSY    . PORTACATH PLACEMENT Left 08/06/2019   Procedure: INSERTION PORT-A-CATH WITH ULTRASOUND GUIDANCE;  Surgeon: [name], MD;  Location: [facility];  Service: General;  Laterality: Left;  Status post bilateral salpingo-oophorectomy   FAMILY HISTORY: Family History  Problem Relation Age of Onset  . Skin cancer Sister   . Colon cancer Paternal Grandfather   . Wilm's tumor Son     Her father is 43 years old as of 07/2019. Her mother was murdered at age 29 (domestic violence).  The patient has 3 sisters and no brothers. She reports a third cousin (grandmother's sister's daughter) with breast cancer in her 77's. She denies a family history of ovarian, prostate, or pancreatic cancer. She does report colon cancer in her paternal grandfather in his 52's, non-melanoma skin cancer in her sister in her 88's, and Wilms tumor in her son at 55 months.   GYNECOLOGIC HISTORY:  Patient's  last menstrual period was 05/09/2011. Menarche: 71-57 years old Age at first live birth: 59 years old [place] P 3 LMP 2013 Contraceptive never used HRT used for  approximately 9 months  Hysterectomy? Yes, 2013 BSO? 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 Husband Debra [name] is an Art gallery manager. She lives at home with husband Debra [name] Daughter Debra [name], age 87, is a poet and Pharmacist, hospital in [place], [place]. Son Debra [name], age 67, is an Production designer, theatre/television/film in [place] in [place], [place]. Son Debra [name], age 55, is a Marketing executive working in Artist in [place], [place] (at the [employer]). Candance has three grandchildren. She is a Media planner.    ADVANCED DIRECTIVES: In the absence of any documentation to the contrary, the patient's spouse is their HCPOA.    HEALTH MAINTENANCE: Social History   Tobacco Use  . Smoking status: Former Research scientist (life sciences)  . Smokeless tobacco: Never Used  Substance Use Topics  . Alcohol use: Yes  . Drug use: Never     Colonoscopy: none on file  PAP: none on file (s/p hysterectomy)  Bone density: n/a (age)   Allergies  Allergen Reactions  . Other Rash    Walnuts give her a rash Walnuts give her a rash Walnuts give her a rash Walnuts give her a rash     Current Outpatient Medications  Medication Sig Dispense Refill  . ciprofloxacin (CIPRO) 500 MG tablet Take 1 tablet (500 mg total) by mouth 2 (two) times daily. Start one week after chemotherapy and continue for 5 days, then repeat next chemotherapy cycle. 40 tablet 0  . dexamethasone (DECADRON) 4 MG tablet Take 2 tablets by mouth daily starting the day after Carboplatin and Cytoxan x 3 days. Take with food. 30 tablet 1  . lidocaine-prilocaine (EMLA) cream Apply to affected area once 30 g 3  . loratadine (CLARITIN) 10 MG tablet TAKE 1 TABLET BY MOUTH EVERY DAY 60 tablet 0  . LORazepam (ATIVAN) 0.5 MG tablet Take 1 tablet (0.5 mg total) by mouth at bedtime as needed (Nausea or vomiting). 30 tablet 0  . Multiple Vitamin (MULTI-VITAMIN DAILY PO) Take by mouth.    .  prochlorperazine (COMPAZINE) 10 MG tablet Take 1 tablet (10 mg total) by mouth every 6 (six) hours as needed (Nausea or vomiting). 30 tablet 1  . traMADol (ULTRAM) 50 MG tablet Take 1 tablet (50 mg total) by mouth every 6 (six) hours as needed for moderate pain or severe pain. 20 tablet 0  . UNABLE TO FIND Omega Krill Oil     No current facility-administered medications for this visit.   Facility-Administered Medications Ordered in Other Visits  Medication Dose Route Frequency Provider Last Rate Last Admin  . CARBOplatin (PARAPLATIN) 270 mg in sodium chloride 0.9 % 250 mL chemo infusion  270 mg Intravenous Once Safiya Girdler, [name], MD      . dexamethasone (DECADRON) 10 mg in sodium chloride 0.9 % 50 mL IVPB  10 mg Intravenous Once Edye Hainline, [name], MD      . fosaprepitant (EMEND) 150 mg in sodium chloride 0.9 % 145 mL IVPB  150 mg Intravenous Once Samiya Mervin, [name], MD 450 mL/hr at 10/15/19 1426 150 mg at 10/15/19 1426  . heparin lock flush 100 unit/mL  500 Units Intracatheter Once PRN Dequon Schnebly, [name], MD      . PACLitaxel (TAXOL) 168 mg in sodium chloride 0.9 % 250 mL chemo infusion (</=  21m/m2)  80 mg/m2 (Order-Specific) Intravenous Once Erandy Mceachern, GVirgie Dad MD      . sodium chloride flush (NS) 0.9 % injection 10 mL  10 mL Intracatheter PRN Lisvet Rasheed, GVirgie Dad MD        OBJECTIVE: White woman in no acute distress  Vitals:   10/15/19 1327  BP: 125/65  Pulse: 69  Resp: 20  Temp: 98.2 F (36.8 C)  SpO2: 100%     Body mass index is 29.93 kg/m.   Wt Readings from Last 3 Encounters:  10/15/19 205 lb 9.6 oz (93.3 kg)  10/08/19 203 lb 5 oz (92.2 kg)  10/01/19 201 lb 9.6 oz (91.4 kg)     ECOG FS:1 - Symptomatic but completely ambulatory  Sclerae unicteric, EOMs intact Wearing a mask No cervical or supraclavicular adenopathy Lungs no rales or rhonchi Heart regular rate and rhythm Abd soft, nontender, positive bowel sounds MSK no focal spinal tenderness, no upper extremity  lymphedema Neuro: nonfocal, well oriented, appropriate affect Breasts: Deferred   LAB RESULTS:  CMP     Component Value Date/Time   NA 143 10/15/2019 1251   K 4.0 10/15/2019 1251   CL 111 10/15/2019 1251   CO2 23 10/15/2019 1251   GLUCOSE 101 (H) 10/15/2019 1251   BUN 18 10/15/2019 1251   CREATININE 0.62 10/15/2019 1251   CREATININE 0.76 07/30/2019 0825   CALCIUM 9.2 10/15/2019 1251   PROT 6.3 (L) 10/15/2019 1251   ALBUMIN 3.4 (L) 10/15/2019 1251   AST 30 10/15/2019 1251   AST 14 (L) 07/30/2019 0825   ALT 39 10/15/2019 1251   ALT 11 07/30/2019 0825   ALKPHOS 86 10/15/2019 1251   BILITOT <0.2 (L) 10/15/2019 1251   BILITOT 0.4 07/30/2019 0825   GFRNONAA >60 10/15/2019 1251   GFRNONAA >60 07/30/2019 0825   GFRAA >60 10/15/2019 1251   GFRAA >60 07/30/2019 0825    No results found for: TOTALPROTELP, ALBUMINELP, A1GS, A2GS, BETS, BETA2SER, GAMS, MSPIKE, SPEI  Lab Results  Component Value Date   WBC 6.7 10/15/2019   NEUTROABS 4.6 10/15/2019   HGB 9.0 (L) 10/15/2019   HCT 28.1 (L) 10/15/2019   MCV 95.9 10/15/2019   PLT 357 10/15/2019    No results found for: LABCA2  No components found for: LLKJZPH150 No results for input(s): INR in the last 168 hours.  No results found for: LABCA2  No results found for: CVWP794 No results found for: CIAX655 No results found for: CVZS827 No results found for: CA2729  No components found for: HGQUANT  No results found for: CEA1 / No results found for: CEA1   No results found for: AFPTUMOR  No results found for: CHROMOGRNA  No results found for: KPAFRELGTCHN, LAMBDASER, KAPLAMBRATIO (kappa/lambda light chains)  No results found for: HGBA, HGBA2QUANT, HGBFQUANT, HGBSQUAN (Hemoglobinopathy evaluation)   No results found for: LDH  No results found for: IRON, TIBC, IRONPCTSAT (Iron and TIBC)  No results found for: FERRITIN  Urinalysis No results found for: COLORURINE, APPEARANCEUR, LABSPEC, PHURINE, GLUCOSEU,  HGBUR, BILIRUBINUR, KETONESUR, PROTEINUR, UROBILINOGEN, NITRITE, LEUKOCYTESUR   STUDIES: No results found.   ELIGIBLE FOR AVAILABLE RESEARCH PROTOCOL: AET  ASSESSMENT: 59[place].o. 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  (2) definitive surgery to follow  (3) adjuvant radiation to follow-up surgery as appropriate   PLAN: Debra [name] is tolerating the Taxol and carbo without any unusual side effects.  She should have her second dose today and if anything it should be easier than the first.  She will be receiving fewer steroids.  I have asked her to take the Prilosec at least daily for the next 7 days and then as needed.  She can also cut the Compazine in half and take 5 mg instead of 10 which I think will make her a little bit less sleepy.  We again reviewed neuropathy symptoms.  She is having none so far.  We will not see her next week but I will see her again in 2 weeks and then 2 weeks after that until she gets to the eighth dose at which point we will start seeing her again on a weekly basis  She knows to call for any other issue that may develop before then  Total encounter time 30 minutes.[name] Ikenna Ohms MD  10/15/19 2:28 PM Medical Oncology and Hematology [facility] [place], [place] [address] Tel. [phone]    Fax. [phone]   I, Wilburn Mylar, am acting as scribe for Dr. [name]. Lesbia Ottaway.  I, Lurline Del MD, have reviewed the above documentation for accuracy and  completeness, and I agree with the above.   *Total Encounter Time as defined by the Centers for Medicare and Medicaid Services includes, in addition to the face-to-face time of a patient visit (documented in the note above) non-face-to-face time: obtaining and reviewing outside history, ordering and reviewing medications, tests or procedures, care coordination (communications with other health care professionals or caregivers) and documentation in the medical record.

## 2019-10-16 ENCOUNTER — Telehealth: Payer: Self-pay | Admitting: Adult Health

## 2019-10-16 NOTE — Telephone Encounter (Signed)
Scheduled appts per 6/9 los. Pt confirmed appt dates and times.  

## 2019-10-22 ENCOUNTER — Other Ambulatory Visit: Payer: BC Managed Care – PPO

## 2019-10-22 ENCOUNTER — Ambulatory Visit: Payer: BC Managed Care – PPO | Admitting: Adult Health

## 2019-10-22 ENCOUNTER — Ambulatory Visit: Payer: BC Managed Care – PPO

## 2019-10-23 ENCOUNTER — Inpatient Hospital Stay: Payer: BC Managed Care – PPO

## 2019-10-23 ENCOUNTER — Encounter: Payer: Self-pay | Admitting: *Deleted

## 2019-10-23 ENCOUNTER — Other Ambulatory Visit: Payer: Self-pay

## 2019-10-23 ENCOUNTER — Inpatient Hospital Stay: Payer: BC Managed Care – PPO | Admitting: Adult Health

## 2019-10-23 VITALS — BP 134/74 | HR 66 | Temp 97.9°F | Resp 18

## 2019-10-23 DIAGNOSIS — C50211 Malignant neoplasm of upper-inner quadrant of right female breast: Secondary | ICD-10-CM

## 2019-10-23 DIAGNOSIS — Z170 Estrogen receptor positive status [ER+]: Secondary | ICD-10-CM

## 2019-10-23 LAB — CBC WITH DIFFERENTIAL/PLATELET
Abs Immature Granulocytes: 0.02 10*3/uL (ref 0.00–0.07)
Basophils Absolute: 0.1 10*3/uL (ref 0.0–0.1)
Basophils Relative: 2 %
Eosinophils Absolute: 0.3 10*3/uL (ref 0.0–0.5)
Eosinophils Relative: 6 %
HCT: 29 % — ABNORMAL LOW (ref 36.0–46.0)
Hemoglobin: 9.5 g/dL — ABNORMAL LOW (ref 12.0–15.0)
Immature Granulocytes: 0 %
Lymphocytes Relative: 20 %
Lymphs Abs: 0.9 10*3/uL (ref 0.7–4.0)
MCH: 31.1 pg (ref 26.0–34.0)
MCHC: 32.8 g/dL (ref 30.0–36.0)
MCV: 95.1 fL (ref 80.0–100.0)
Monocytes Absolute: 0.5 10*3/uL (ref 0.1–1.0)
Monocytes Relative: 11 %
Neutro Abs: 2.8 10*3/uL (ref 1.7–7.7)
Neutrophils Relative %: 61 %
Platelets: 233 10*3/uL (ref 150–400)
RBC: 3.05 MIL/uL — ABNORMAL LOW (ref 3.87–5.11)
RDW: 17 % — ABNORMAL HIGH (ref 11.5–15.5)
WBC: 4.6 10*3/uL (ref 4.0–10.5)
nRBC: 0 % (ref 0.0–0.2)

## 2019-10-23 LAB — COMPREHENSIVE METABOLIC PANEL
ALT: 53 U/L — ABNORMAL HIGH (ref 0–44)
AST: 37 U/L (ref 15–41)
Albumin: 3.5 g/dL (ref 3.5–5.0)
Alkaline Phosphatase: 98 U/L (ref 38–126)
Anion gap: 8 (ref 5–15)
BUN: 15 mg/dL (ref 6–20)
CO2: 24 mmol/L (ref 22–32)
Calcium: 8.8 mg/dL — ABNORMAL LOW (ref 8.9–10.3)
Chloride: 110 mmol/L (ref 98–111)
Creatinine, Ser: 0.65 mg/dL (ref 0.44–1.00)
GFR calc Af Amer: >60 mL/min (ref >60)
GFR calc non Af Amer: >60 mL/min (ref >60)
Glucose, Bld: 106 mg/dL — ABNORMAL HIGH (ref 70–99)
Potassium: 4.1 mmol/L (ref 3.5–5.1)
Sodium: 142 mmol/L (ref 135–145)
Total Bilirubin: 0.3 mg/dL (ref 0.3–1.2)
Total Protein: 6.4 g/dL — ABNORMAL LOW (ref 6.5–8.1)

## 2019-10-23 LAB — SAMPLE TO BLOOD BANK

## 2019-10-23 MED ORDER — DIPHENHYDRAMINE HCL 50 MG/ML IJ SOLN
INTRAMUSCULAR | Status: AC
  Filled 2019-10-23: qty 1

## 2019-10-23 MED ORDER — SODIUM CHLORIDE 0.9 % IV SOLN
10.0000 mg | Freq: Once | INTRAVENOUS | Status: AC
  Administered 2019-10-23: 10 mg via INTRAVENOUS
  Filled 2019-10-23: qty 10

## 2019-10-23 MED ORDER — SODIUM CHLORIDE 0.9 % IV SOLN
150.0000 mg | Freq: Once | INTRAVENOUS | Status: AC
  Administered 2019-10-23: 150 mg via INTRAVENOUS
  Filled 2019-10-23: qty 150

## 2019-10-23 MED ORDER — SODIUM CHLORIDE 0.9 % IV SOLN
80.0000 mg/m2 | Freq: Once | INTRAVENOUS | Status: AC
  Administered 2019-10-23: 168 mg via INTRAVENOUS
  Filled 2019-10-23: qty 28

## 2019-10-23 MED ORDER — FAMOTIDINE IN NACL 20-0.9 MG/50ML-% IV SOLN
20.0000 mg | Freq: Once | INTRAVENOUS | Status: AC
  Administered 2019-10-23: 20 mg via INTRAVENOUS

## 2019-10-23 MED ORDER — PALONOSETRON HCL INJECTION 0.25 MG/5ML
0.2500 mg | Freq: Once | INTRAVENOUS | Status: AC
  Administered 2019-10-23: 0.25 mg via INTRAVENOUS

## 2019-10-23 MED ORDER — HEPARIN SOD (PORK) LOCK FLUSH 100 UNIT/ML IV SOLN
500.0000 [IU] | Freq: Once | INTRAVENOUS | Status: AC | PRN
  Administered 2019-10-23: 500 [IU]
  Filled 2019-10-23: qty 5

## 2019-10-23 MED ORDER — PALONOSETRON HCL INJECTION 0.25 MG/5ML
INTRAVENOUS | Status: AC
  Filled 2019-10-23: qty 5

## 2019-10-23 MED ORDER — FAMOTIDINE IN NACL 20-0.9 MG/50ML-% IV SOLN
INTRAVENOUS | Status: AC
  Filled 2019-10-23: qty 50

## 2019-10-23 MED ORDER — SODIUM CHLORIDE 0.9 % IV SOLN
Freq: Once | INTRAVENOUS | Status: AC
  Filled 2019-10-23: qty 250

## 2019-10-23 MED ORDER — SODIUM CHLORIDE 0.9 % IV SOLN
273.2000 mg | Freq: Once | INTRAVENOUS | Status: AC
  Administered 2019-10-23: 270 mg via INTRAVENOUS
  Filled 2019-10-23: qty 27

## 2019-10-23 MED ORDER — SODIUM CHLORIDE 0.9% FLUSH
10.0000 mL | INTRAVENOUS | Status: DC | PRN
  Administered 2019-10-23: 10 mL
  Filled 2019-10-23: qty 10

## 2019-10-23 MED ORDER — DIPHENHYDRAMINE HCL 50 MG/ML IJ SOLN
25.0000 mg | Freq: Once | INTRAMUSCULAR | Status: AC
  Administered 2019-10-23: 25 mg via INTRAVENOUS

## 2019-10-23 NOTE — Patient Instructions (Signed)
[facility] Discharge Instructions for Patients Receiving Chemotherapy  Today you received the following chemotherapy agents Taxol and Carboplatin.  To help prevent nausea and vomiting after your treatment, we encourage you to take your nausea medication Do not take zofran for three days after treatment.   If you develop nausea and vomiting that is not controlled by your nausea medication, call the clinic.   BELOW ARE SYMPTOMS THAT SHOULD BE REPORTED IMMEDIATELY:  *FEVER GREATER THAN 100.5 F  *CHILLS WITH OR WITHOUT FEVER  NAUSEA AND VOMITING THAT IS NOT CONTROLLED WITH YOUR NAUSEA MEDICATION  *UNUSUAL SHORTNESS OF BREATH  *UNUSUAL BRUISING OR BLEEDING  TENDERNESS IN MOUTH AND THROAT WITH OR WITHOUT PRESENCE OF ULCERS  *URINARY PROBLEMS  *BOWEL PROBLEMS  UNUSUAL RASH Items with * indicate a potential emergency and should be followed up as soon as possible.  Feel free to call the clinic should you have any questions or concerns. The clinic phone number is [phone].  Please show the [facility] at check-in to the Emergency Department and triage nurse.

## 2019-10-28 ENCOUNTER — Encounter: Payer: Self-pay | Admitting: Genetic Counselor

## 2019-10-28 ENCOUNTER — Inpatient Hospital Stay (HOSPITAL_BASED_OUTPATIENT_CLINIC_OR_DEPARTMENT_OTHER): Payer: BC Managed Care – PPO | Admitting: Genetic Counselor

## 2019-10-28 ENCOUNTER — Other Ambulatory Visit: Payer: Self-pay

## 2019-10-28 ENCOUNTER — Other Ambulatory Visit: Payer: Self-pay | Admitting: Genetic Counselor

## 2019-10-28 ENCOUNTER — Inpatient Hospital Stay: Payer: BC Managed Care – PPO

## 2019-10-28 ENCOUNTER — Encounter: Payer: Self-pay | Admitting: *Deleted

## 2019-10-28 DIAGNOSIS — Z800 Family history of malignant neoplasm of digestive organs: Secondary | ICD-10-CM

## 2019-10-28 DIAGNOSIS — Z808 Family history of malignant neoplasm of other organs or systems: Secondary | ICD-10-CM

## 2019-10-28 DIAGNOSIS — Z170 Estrogen receptor positive status [ER+]: Secondary | ICD-10-CM

## 2019-10-28 DIAGNOSIS — C50211 Malignant neoplasm of upper-inner quadrant of right female breast: Secondary | ICD-10-CM

## 2019-10-28 DIAGNOSIS — Z803 Family history of malignant neoplasm of breast: Secondary | ICD-10-CM | POA: Insufficient documentation

## 2019-10-28 NOTE — Progress Notes (Signed)
REFERRING PROVIDER: Gardenia Phlegm, NP [place],  [place] [address]  PRIMARY PROVIDER:  [name]  PRIMARY REASON FOR VISIT:  1. Malignant neoplasm of upper-inner quadrant of right breast in female, estrogen receptor positive ([place])   2. Family history of breast cancer   3. Family history of colon cancer   4. Family history of nonmelanoma skin cancer      HISTORY OF PRESENT ILLNESS:   Ms. Meharg, a 59 y.o. female, was seen for a [place] cancer genetics consultation at the request of Dr. Delice Bison due to a personal and family history of cancer.  Ms. Santiago presents to clinic today to discuss the possibility of a hereditary predisposition to cancer, genetic testing, and to further clarify her future cancer risks, as well as potential cancer risks for family members.   In March 2021, at the age of 62, Ms. 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 The treatment plan includes neoadjuvant chemotherapy, surgery, and adjuvant radiation as appropriate.    CANCER HISTORY:  Oncology History  Malignant neoplasm of upper-inner quadrant of right breast in female, estrogen receptor positive ([place])  07/23/2019 Initial Diagnosis   Malignant neoplasm of upper-inner quadrant of right breast in female, estrogen receptor positive ([place])   08/07/2019 -  Chemotherapy   The patient had dexamethasone (DECADRON) 4 MG tablet, 1 of 1 cycle, Start date: 07/30/2019, End date: -- DOXOrubicin (ADRIAMYCIN) chemo injection 126 mg, 60 mg/m2 = 126 mg, Intravenous,  Once, 4 of 4 cycles Administration: 126 mg (08/07/2019), 126 mg (08/21/2019), 126 mg (09/04/2019), 126 mg (09/18/2019) palonosetron (ALOXI) injection 0.25 mg, 0.25 mg, Intravenous,  Once, 5 of 8 cycles Administration: 0.25 mg (08/07/2019), 0.25 mg (10/08/2019), 0.25 mg (08/21/2019), 0.25 mg (09/04/2019), 0.25 mg (09/18/2019), 0.25 mg (10/15/2019), 0.25 mg  (10/23/2019) pegfilgrastim (NEULASTA ONPRO KIT) injection 6 mg, 6 mg, Subcutaneous, Once, 1 of 1 cycle Administration: 6 mg (08/08/2019) pegfilgrastim-jmdb (FULPHILA) injection 6 mg, 6 mg, Subcutaneous,  Once, 3 of 3 cycles Administration: 6 mg (08/23/2019), 6 mg (09/06/2019), 6 mg (09/20/2019) CARBOplatin (PARAPLATIN) 270 mg in sodium chloride 0.9 % 250 mL chemo infusion, 270 mg (100 % of original dose 273.2 mg), Intravenous,  Once, 1 of 4 cycles Dose modification:   (original dose 273.2 mg, Cycle 5), 273.2 mg (original dose 273.2 mg, Cycle 5) Administration: 270 mg (10/08/2019), 270 mg (10/15/2019), 270 mg (10/23/2019) cyclophosphamide (CYTOXAN) 1,260 mg in sodium chloride 0.9 % 250 mL chemo infusion, 600 mg/m2 = 1,260 mg, Intravenous,  Once, 4 of 4 cycles Administration: 1,260 mg (08/07/2019), 1,260 mg (08/21/2019), 1,260 mg (09/04/2019), 1,260 mg (09/18/2019) PACLitaxel (TAXOL) 168 mg in sodium chloride 0.9 % 250 mL chemo infusion (</= 68m/m2), 80 mg/m2 = 168 mg, Intravenous,  Once, 1 of 4 cycles Administration: 168 mg (10/08/2019), 168 mg (10/15/2019), 168 mg (10/23/2019) fosaprepitant (EMEND) 150 mg in sodium chloride 0.9 % 145 mL IVPB, 150 mg, Intravenous,  Once, 5 of 8 cycles Administration: 150 mg (08/07/2019), 150 mg (10/08/2019), 150 mg (08/21/2019), 150 mg (09/04/2019), 150 mg (09/18/2019), 150 mg (10/15/2019), 150 mg (10/23/2019)  for chemotherapy treatment.       RISK FACTORS:  Menarche was at age 60-14  First live birth at age 59  No OCP use.  Ovaries intact: no.  Hysterectomy: yes.  Menopausal status: postmenopausal.  HRT use: 9 months. Colonoscopy: yes; 3 years ago, 1 polyp. Mammogram within the last year: yes. Number of breast biopsies: 1.  Any excessive radiation exposure in the past: no   Past Medical History:  Diagnosis Date  . Family history of breast cancer   . Family history of colon cancer   . Family history of nonmelanoma skin cancer     Past Surgical History:  Procedure  Laterality Date  . ABDOMINAL HYSTERECTOMY    . LUNG BIOPSY    . PORTACATH PLACEMENT Left 08/06/2019   Procedure: INSERTION PORT-A-CATH WITH ULTRASOUND GUIDANCE;  Surgeon: [name], MD;  Location: [facility];  Service: General;  Laterality: Left;    Social History   Socioeconomic History  . Marital status: Married    Spouse name: Not on file  . Number of children: Not on file  . Years of education: Not on file  . Highest education level: Not on file  Occupational History  . Not on file  Tobacco Use  . Smoking status: Former Research scientist (life sciences)  . Smokeless tobacco: Never Used  Substance and Sexual Activity  . Alcohol use: Yes  . Drug use: Never  . Sexual activity: Not on file  Other Topics Concern  . Not on file  Social History Narrative  . Not on file   Social Determinants of Health   Financial Resource Strain:   . Difficulty of Paying Living Expenses:   Food Insecurity:   . Worried About Charity fundraiser in the Last Year:   . Arboriculturist in the Last Year:   Transportation Needs:   . Film/video editor (Medical):   Marland Kitchen Lack of Transportation (Non-Medical):   Physical Activity:   . Days of Exercise per Week:   . Minutes of Exercise per Session:   Stress:   . Feeling of Stress :   Social Connections:   . Frequency of Communication with Friends and Family:   . Frequency of Social Gatherings with Friends and Family:   . Attends Religious Services:   . Active Member of Clubs or Organizations:   . Attends Archivist Meetings:   Marland Kitchen Marital Status:      FAMILY HISTORY:  We obtained a detailed, 4-generation family history.  Significant diagnoses are listed below: Family History  Problem Relation Age of Onset  . Skin cancer Sister        non-melanoma dx. in her 15s  . Other Sister        normal genetic testing for hereditary cancer risks  . Colon cancer Paternal Grandfather        dx. in his 65s  . Wilm's tumor Son        dx. 9 months   . Breast cancer Other        dx. in her 53s (PGM's sister)  . Wilm's tumor Nephew 2  . Breast cancer Other        dx. in her 96s (PGM's niece)   Ms. Smeal has one daughter (age 64) and two sons (ages 73 and 68). Her older son had a history of a Wilms tumor diagnosed when he was 54 months old, and does not have any other physical abnormalities suggestive of a genetic syndrome. Ms. Tuohy has three sisters (ages 108, 56, and 73). One sister has a history of non-melanoma skin cancer diagnosed in her 59s, and has also had negative genetic testing for a hereditary cancer gene panel (report not reviewed at today's visit). Ms. Muma has a nephew who was diagnosed with a Wilms tumor when he was 60 years old.   Ms.  Bolt's mother died at the age of 34 due to domestic violence, and did not have cancer. Ms. Iten did not have any maternal aunts or uncles. Her maternal grandmother died at the age of 36, also due to domestic violence, and her maternal grandfather died in his 50s. There are no known diagnoses of cancer on the maternal side of the family.  Ms. Gaultney father is 49 and has not had cancer. She has one paternal aunt and two paternal uncles. One uncle died in his 43s from spleen/immune problems. Her maternal grandfather was diagnosed with colon cancer in his 48s, and died in his late 33s or early 53s. Her maternal grandmother died in her late 41s or early 78s and did not have cancer. Ms. Brinton maternal grandmother had a sister who was diagnosed with breast cancer in her 32s, and a niece who was diagnosed with breast cancer in her 44s.  Ms. Blasing is aware of previous family history of genetic testing for hereditary cancer risks in her sister, which was normal. Patient's ancestors are of [place], [place], and [place] descent. There is no reported Ashkenazi Jewish ancestry. There is no known consanguinity.  GENETIC COUNSELING ASSESSMENT: Ms. Thurston is a 59 y.o. female with a personal and family  history of breast cancer and wilms tumors, which is somewhat suggestive of a hereditary cancer syndrome and predisposition to cancer. We, therefore, discussed and recommended the following at today's visit.   DISCUSSION: We discussed that 5-10% of breast cancer is hereditary, with most cases associated with the BRCA1 and BRCA2 genes. There are other genes that can be associated with hereditary breast cancer syndromes. These include ATM, CHEK2, PALB2, etc. We also reviewed her family history of Wilms tumors in her son and her nephew. Approximately 1-2% of individuals with Wilms tumor have at least one relative also diagnosed with Wilms tumor (familial Wilms tumor). A small proportion of familial Wilms tumor may be explained by pathogenic variants in genes such as WT1. We discussed that testing is beneficial for several reasons, including knowing about other cancer risks, identifying potential screening and risk-reduction options that may be appropriate, and to understand if other family members could be at risk for cancer and allow them to undergo genetic testing.   We reviewed the characteristics, features and inheritance patterns of hereditary cancer syndromes. We also discussed genetic testing, including the appropriate family members to test, the process of testing, insurance coverage and turn-around-time for results. We discussed the implications of a negative, positive and/or variant of uncertain significant result. We recommended Ms. Ruud pursue genetic testing for a hereditary cancer gene panel, such as the Common Hereditary Cancers Panel + Wilms Tumor Panel. Ms. Beem may also consider testing of a larger gene panel, such as the Multi-Cancers Panel.  The Common Hereditary Cancers Panel offered by [facility] includes sequencing and/or deletion duplication testing of the following 48 genes: APC, ATM, AXIN2, BARD1, BMPR1A, BRCA1, BRCA2, BRIP1, CDH1, CDK4, CDKN2A (p14ARF), CDKN2A (p16INK4a), CHEK2, CTNNA1,  DICER1, EPCAM (Deletion/duplication testing only), GREM1 (promoter region deletion/duplication testing only), KIT, MEN1, MLH1, MSH2, MSH3, MSH6, MUTYH, NBN, NF1, NHTL1, PALB2, PDGFRA, PMS2, POLD1, POLE, PTEN, RAD50, RAD51C, RAD51D, RNF43, SDHB, SDHC, SDHD, SMAD4, SMARCA4. STK11, TP53, TSC1, TSC2, and VHL.  The following genes are evaluated for sequence changes only: SDHA and HOXB13 c.251G>A variant only. The Wilms Tumor Panel offered by [facility] includes sequencing and deletion/duplication testing of the following 6 genes: CDC73, CDKN1C, DIS3L2, GPC3, REST, and WT1.  Based on Ms. Muratalla's personal  history of triple negative breast cancer, she meets medical criteria for genetic testing. Despite that she meets criteria, she may still have an out of pocket cost. We discussed that if her out of pocket cost for testing is over $100, the laboratory will reach out to let her know. If the out of pocket cost of testing is less than $100 she will be billed by the genetic testing laboratory.   PLAN: After considering the risks, benefits, and limitations, Ms. Almanzar provided informed consent to pursue genetic testing. The blood sample will be obtained on 10/29/19 and sent to [facility]. Ms. Bagg will call back with her decision regarding which test she would like to pursue. Results should be available within approximately two-three weeks' time, at which point they will be disclosed by telephone to Ms. Kauffmann, as will any additional recommendations warranted by these results. Ms. Palacios will receive a summary of her genetic counseling visit and a copy of her results once available. This information will also be available in Epic.   Ms. Olesky questions were answered to her satisfaction today. Our contact information was provided should additional questions or concerns arise. Thank you for the referral and allowing [place] to share in the care of your patient.   Clint Guy, [place], [facility] Licensed, Certified [email or web address] Phone: [phone]  The patient was seen for a total of 45 minutes in face-to-face genetic counseling.  This patient was discussed with Drs. Magrinat, Lindi Adie and/or Burr Medico who agrees with the above.    _______________________________________________________________________ For Office Staff:  Number of people involved in session: 2 Was an Intern/ student involved with case: yes - observation only

## 2019-10-29 ENCOUNTER — Inpatient Hospital Stay: Payer: BC Managed Care – PPO

## 2019-10-29 ENCOUNTER — Inpatient Hospital Stay (HOSPITAL_BASED_OUTPATIENT_CLINIC_OR_DEPARTMENT_OTHER): Payer: BC Managed Care – PPO | Admitting: Adult Health

## 2019-10-29 ENCOUNTER — Other Ambulatory Visit: Payer: Self-pay

## 2019-10-29 ENCOUNTER — Encounter: Payer: Self-pay | Admitting: Adult Health

## 2019-10-29 VITALS — BP 144/72 | HR 72 | Temp 98.2°F | Resp 18 | Ht 69.5 in | Wt 200.5 lb

## 2019-10-29 DIAGNOSIS — C50211 Malignant neoplasm of upper-inner quadrant of right female breast: Secondary | ICD-10-CM | POA: Diagnosis not present

## 2019-10-29 DIAGNOSIS — Z170 Estrogen receptor positive status [ER+]: Secondary | ICD-10-CM

## 2019-10-29 LAB — COMPREHENSIVE METABOLIC PANEL
ALT: 48 U/L — ABNORMAL HIGH (ref 0–44)
AST: 35 U/L (ref 15–41)
Albumin: 3.9 g/dL (ref 3.5–5.0)
Alkaline Phosphatase: 96 U/L (ref 38–126)
Anion gap: 8 (ref 5–15)
BUN: 14 mg/dL (ref 6–20)
CO2: 22 mmol/L (ref 22–32)
Calcium: 9.3 mg/dL (ref 8.9–10.3)
Chloride: 107 mmol/L (ref 98–111)
Creatinine, Ser: 0.66 mg/dL (ref 0.44–1.00)
GFR calc Af Amer: >60 mL/min (ref >60)
GFR calc non Af Amer: >60 mL/min (ref >60)
Glucose, Bld: 90 mg/dL (ref 70–99)
Potassium: 4 mmol/L (ref 3.5–5.1)
Sodium: 137 mmol/L (ref 135–145)
Total Bilirubin: 0.5 mg/dL (ref 0.3–1.2)
Total Protein: 6.9 g/dL (ref 6.5–8.1)

## 2019-10-29 LAB — CBC WITH DIFFERENTIAL/PLATELET
Abs Immature Granulocytes: 0.03 10*3/uL (ref 0.00–0.07)
Basophils Absolute: 0.1 10*3/uL (ref 0.0–0.1)
Basophils Relative: 1 %
Eosinophils Absolute: 0.2 10*3/uL (ref 0.0–0.5)
Eosinophils Relative: 3 %
HCT: 30.3 % — ABNORMAL LOW (ref 36.0–46.0)
Hemoglobin: 9.9 g/dL — ABNORMAL LOW (ref 12.0–15.0)
Immature Granulocytes: 1 %
Lymphocytes Relative: 21 %
Lymphs Abs: 1 10*3/uL (ref 0.7–4.0)
MCH: 31 pg (ref 26.0–34.0)
MCHC: 32.7 g/dL (ref 30.0–36.0)
MCV: 95 fL (ref 80.0–100.0)
Monocytes Absolute: 0.4 10*3/uL (ref 0.1–1.0)
Monocytes Relative: 8 %
Neutro Abs: 3.2 10*3/uL (ref 1.7–7.7)
Neutrophils Relative %: 66 %
Platelets: 222 10*3/uL (ref 150–400)
RBC: 3.19 MIL/uL — ABNORMAL LOW (ref 3.87–5.11)
RDW: 16.2 % — ABNORMAL HIGH (ref 11.5–15.5)
WBC: 4.8 10*3/uL (ref 4.0–10.5)
nRBC: 0 % (ref 0.0–0.2)

## 2019-10-29 LAB — GENETIC SCREENING ORDER

## 2019-10-29 LAB — SAMPLE TO BLOOD BANK

## 2019-10-29 MED ORDER — SODIUM CHLORIDE 0.9% FLUSH
10.0000 mL | INTRAVENOUS | Status: DC | PRN
  Administered 2019-10-29: 10 mL
  Filled 2019-10-29: qty 10

## 2019-10-29 MED ORDER — PALONOSETRON HCL INJECTION 0.25 MG/5ML
0.2500 mg | Freq: Once | INTRAVENOUS | Status: AC
  Administered 2019-10-29: 0.25 mg via INTRAVENOUS

## 2019-10-29 MED ORDER — FAMOTIDINE IN NACL 20-0.9 MG/50ML-% IV SOLN
INTRAVENOUS | Status: AC
  Filled 2019-10-29: qty 50

## 2019-10-29 MED ORDER — SODIUM CHLORIDE 0.9 % IV SOLN
Freq: Once | INTRAVENOUS | Status: AC
  Filled 2019-10-29: qty 250

## 2019-10-29 MED ORDER — SODIUM CHLORIDE 0.9 % IV SOLN
10.0000 mg | Freq: Once | INTRAVENOUS | Status: AC
  Administered 2019-10-29: 10 mg via INTRAVENOUS
  Filled 2019-10-29: qty 10

## 2019-10-29 MED ORDER — SODIUM CHLORIDE 0.9 % IV SOLN
273.2000 mg | Freq: Once | INTRAVENOUS | Status: AC
  Administered 2019-10-29: 270 mg via INTRAVENOUS
  Filled 2019-10-29: qty 27

## 2019-10-29 MED ORDER — HEPARIN SOD (PORK) LOCK FLUSH 100 UNIT/ML IV SOLN
500.0000 [IU] | Freq: Once | INTRAVENOUS | Status: AC | PRN
  Administered 2019-10-29: 500 [IU]
  Filled 2019-10-29: qty 5

## 2019-10-29 MED ORDER — DIPHENHYDRAMINE HCL 50 MG/ML IJ SOLN
25.0000 mg | Freq: Once | INTRAMUSCULAR | Status: AC
  Administered 2019-10-29: 25 mg via INTRAVENOUS

## 2019-10-29 MED ORDER — PALONOSETRON HCL INJECTION 0.25 MG/5ML
INTRAVENOUS | Status: AC
  Filled 2019-10-29: qty 5

## 2019-10-29 MED ORDER — DIPHENHYDRAMINE HCL 50 MG/ML IJ SOLN
INTRAMUSCULAR | Status: AC
  Filled 2019-10-29: qty 1

## 2019-10-29 MED ORDER — FAMOTIDINE IN NACL 20-0.9 MG/50ML-% IV SOLN
20.0000 mg | Freq: Once | INTRAVENOUS | Status: AC
  Administered 2019-10-29: 20 mg via INTRAVENOUS

## 2019-10-29 MED ORDER — SODIUM CHLORIDE 0.9 % IV SOLN
80.0000 mg/m2 | Freq: Once | INTRAVENOUS | Status: AC
  Administered 2019-10-29: 168 mg via INTRAVENOUS
  Filled 2019-10-29: qty 28

## 2019-10-29 NOTE — Progress Notes (Signed)
[place]  [phone] [phone]     ID: Debra [name] DOB: 1960/07/16  MR#: [id number]  MHD#:[id number]  Patient Care Team: [name] as PCP - General (Family Medicine) [name], RN as Oncology Nurse Navigator [name], RN as Oncology Nurse Navigator [name], MD as Consulting Physician (General Surgery) Magrinat, [name], MD as Consulting Physician (Oncology) [name], MD as Consulting Physician (Radiation Oncology) Scot Dock, NP OTHER MD:  CHIEF COMPLAINT: functionally triple negative breast cancer  CURRENT TREATMENT: Neoadjuvant chemotherapy   INTERVAL HISTORY: Debra [name] returns today for follow up and treatment of her 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   She started on weekly carboplatin and paclitaxel on 10/08/2019. Today is week 3.  She is doing quite well today.  She is having some mild lower abdominal cramping that starts two days prior to treatment.  She is passing gas and having regular bowel movements.  This cramping occurs every time she eats.      REVIEW OF SYSTEMS: Debra [name] is otherwise doing quite well.  She has no fever, chills, chest pain, palpitations, cough, bladder changes, headaches, nausea, vomiting, hematochezia, melena or any other concerns.  A detailed ROS was otherwise non contributory.    HISTORY OF CURRENT ILLNESS: From the original intake note:  Debra [name] herself palpated a mass in the upper-inner right breast in February 2021.  It appeared to increase in size over the next 3 weeks. Physical exam performed at [facility] 07/15/2019 confirmed a 6 mm firm, oval, palpable mass in the upper-inner right breast. She underwent bilateral diagnostic mammography with tomography and bilateral breast ultrasonography on 07/15/2019 showing: breast density category C; 4.6 cm mass in the right breast at 1 o'clock; single abnormal-appearing lymph node; bilateral  benign cysts.  Accordingly on 07/21/2019 she proceeded to biopsy of the right breast area in question. 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 Prognostic indicators significant for: estrogen receptor, 60% positive with weak staining intensity and progesterone receptor, 0% negative. Proliferation marker Ki67 at 90%. HER2 equivocal by immunohistochemistry (2+), but negative by fluorescent in situ hybridization with a signals ratio 1.24 and number per cell 2.55.  The questionable right axillary lymph node was biopsied as well and was benign (concordant).  The patient's subsequent history is as detailed below.   PAST MEDICAL HISTORY: Past Medical History:  Diagnosis Date  . Family history of breast cancer   . Family history of colon cancer   . Family history of nonmelanoma skin cancer      PAST SURGICAL HISTORY: Past Surgical History:  Procedure Laterality Date  . ABDOMINAL HYSTERECTOMY    . LUNG BIOPSY    . PORTACATH PLACEMENT Left 08/06/2019   Procedure: INSERTION PORT-A-CATH WITH ULTRASOUND GUIDANCE;  Surgeon: [name], MD;  Location: [facility];  Service: General;  Laterality: Left;  Status post bilateral salpingo-oophorectomy   FAMILY HISTORY: Family History  Problem Relation Age of Onset  . Skin cancer Sister        non-melanoma dx. in her 41s  . Other Sister        normal genetic testing for hereditary cancer risks  . Colon cancer Paternal Grandfather        dx. in his 61s  . Wilm's tumor Son        dx. 9 months  . Breast cancer Other        dx.  in her 49s (PGM's sister)  . Wilm's tumor Nephew 2  . Breast cancer Other        dx. in her 55s (PGM's niece)    Her father is 84 years old as of 07/2019. Her mother was murdered at age 6 (domestic violence).  The patient has 3 sisters and no brothers. She reports a third cousin (grandmother's sister's  daughter) with breast cancer in her 36's. She denies a family history of ovarian, prostate, or pancreatic cancer. She does report colon cancer in her paternal grandfather in his 63's, non-melanoma skin cancer in her sister in her 86's, and Wilms tumor in her son at 85 months.   GYNECOLOGIC HISTORY:  Patient's last menstrual period was 05/09/2011. Menarche: [age] years old Age at first live birth: 59 years old [place] P 3 LMP 2013 Contraceptive never used HRT used for approximately 9 months  Hysterectomy? Yes, 2013 BSO? 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 Husband [name] is an Art gallery manager. She lives at home with husband Debra [name] Daughter [name], age 36, is a poet and Pharmacist, hospital in [place], [place]. Son [name], age 71, is an Production designer, theatre/television/film in [place] in [place], [place]. Son Hawley Pavia, age 62, is a Marketing executive working in Artist in [place], [place] (at the [employer]). Cressie has three grandchildren. She is a Media planner.    ADVANCED DIRECTIVES: In the absence of any documentation to the contrary, the patient's spouse is their HCPOA.    HEALTH MAINTENANCE: Social History   Tobacco Use  . Smoking status: Former Research scientist (life sciences)  . Smokeless tobacco: Never Used  Substance Use Topics  . Alcohol use: Yes  . Drug use: Never     Colonoscopy: none on file  PAP: none on file (s/p hysterectomy)  Bone density: n/a (age)   Allergies  Allergen Reactions  . Other Rash    Walnuts give her a rash Walnuts give her a rash Walnuts give her a rash Walnuts give her a rash     Current Outpatient Medications  Medication Sig Dispense Refill  . lidocaine-prilocaine (EMLA) cream Apply to affected area once 30 g 3  . LORazepam (ATIVAN) 0.5 MG tablet Take 1 tablet (0.5 mg total) by mouth at bedtime as needed (Nausea or vomiting). 30 tablet 0  . Multiple Vitamin (MULTI-VITAMIN DAILY PO) Take  by mouth.    . prochlorperazine (COMPAZINE) 10 MG tablet Take 1 tablet (10 mg total) by mouth every 6 (six) hours as needed (Nausea or vomiting). 30 tablet 1  . traMADol (ULTRAM) 50 MG tablet Take 1 tablet (50 mg total) by mouth every 6 (six) hours as needed for moderate pain or severe pain. 20 tablet 0  . UNABLE TO FIND Omega Krill Oil     No current facility-administered medications for this visit.    OBJECTIVE: White woman in no acute distress  Vitals:   10/29/19 1259  BP: (!) 144/72  Pulse: 72  Resp: 18  Temp: 98.2 F (36.8 C)  SpO2: 100%     Body mass index is 29.18 kg/m.   Wt Readings from Last 3 Encounters:  10/29/19 200 lb 8 oz (90.9 kg)  10/15/19 205 lb 9.6 oz (93.3 kg)  10/08/19 203 lb 5 oz (92.2 kg)     ECOG FS:1 - Symptomatic but completely ambulatory  GENERAL: Patient is a well appearing female in no acute distress HEENT:  Sclerae anicteric.  Mask  in place. Neck is supple.  NODES:  No cervical, supraclavicular, or axillary lymphadenopathy palpated.  BREAST EXAM: unable to palpate right breast mass  ? Soft area in right breast LUNGS:  Clear to auscultation bilaterally.  No wheezes or rhonchi. HEART:  Regular rate and rhythm. No murmur appreciated. ABDOMEN:  Soft, nontender.  Positive, normoactive bowel sounds. No organomegaly palpated. MSK:  No focal spinal tenderness to palpation. Full range of motion bilaterally in the upper extremities. EXTREMITIES:  No peripheral edema.   SKIN:  Clear with no obvious rashes or skin changes. No nail dyscrasia. NEURO:  Nonfocal. Well oriented.  Appropriate affect.     LAB RESULTS:  CMP     Component Value Date/Time   NA 142 10/23/2019 1200   K 4.1 10/23/2019 1200   CL 110 10/23/2019 1200   CO2 24 10/23/2019 1200   GLUCOSE 106 (H) 10/23/2019 1200   BUN 15 10/23/2019 1200   CREATININE 0.65 10/23/2019 1200   CREATININE 0.76 07/30/2019 0825   CALCIUM 8.8 (L) 10/23/2019 1200   PROT 6.4 (L) 10/23/2019 1200   ALBUMIN  3.5 10/23/2019 1200   AST 37 10/23/2019 1200   AST 14 (L) 07/30/2019 0825   ALT 53 (H) 10/23/2019 1200   ALT 11 07/30/2019 0825   ALKPHOS 98 10/23/2019 1200   BILITOT 0.3 10/23/2019 1200   BILITOT 0.4 07/30/2019 0825   GFRNONAA >60 10/23/2019 1200   GFRNONAA >60 07/30/2019 0825   GFRAA >60 10/23/2019 1200   GFRAA >60 07/30/2019 0825    No results found for: TOTALPROTELP, ALBUMINELP, A1GS, A2GS, BETS, BETA2SER, GAMS, MSPIKE, SPEI  Lab Results  Component Value Date   WBC 4.8 10/29/2019   NEUTROABS 3.2 10/29/2019   HGB 9.9 (L) 10/29/2019   HCT 30.3 (L) 10/29/2019   MCV 95.0 10/29/2019   PLT 222 10/29/2019    No results found for: LABCA2  No components found for: NLGXQJ194  No results for input(s): INR in the last 168 hours.  No results found for: LABCA2  No results found for: RDE081  No results found for: KGY185  No results found for: UDJ497  No results found for: CA2729  No components found for: HGQUANT  No results found for: CEA1 / No results found for: CEA1   No results found for: AFPTUMOR  No results found for: CHROMOGRNA  No results found for: KPAFRELGTCHN, LAMBDASER, KAPLAMBRATIO (kappa/lambda light chains)  No results found for: HGBA, HGBA2QUANT, HGBFQUANT, HGBSQUAN (Hemoglobinopathy evaluation)   No results found for: LDH  No results found for: IRON, TIBC, IRONPCTSAT (Iron and TIBC)  No results found for: FERRITIN  Urinalysis No results found for: COLORURINE, APPEARANCEUR, LABSPEC, PHURINE, GLUCOSEU, HGBUR, BILIRUBINUR, KETONESUR, PROTEINUR, UROBILINOGEN, NITRITE, LEUKOCYTESUR   STUDIES: No results found.   ELIGIBLE FOR AVAILABLE RESEARCH PROTOCOL: AET  ASSESSMENT: 59 y.o. [place], [place] woman status post right breast upper inner quadrant biopsy 07/21/2019 for a clinical T2N0, stage IIb invasive ductal carcinoma, grade 3, functionally triple negative (metaplastic features), with an MIB-1 of 90%  (1) neoadjuvant chemotherapy consisting of  cyclophosphamide and doxorubicin in dose dense fashion x4 started 08/07/2019, completed 09/18/2019, followed by paclitaxel and carboplatin weekly x12 started 10/08/2019  (a) echo 08/05/2019 showed an ejection fraction in the 60-65% range.  (2) definitive surgery to follow  (3) adjuvant radiation to follow-up surgery as appropriate   PLAN: Marne is doing well today.  She continues on her second chemotherapy regimen with Paclitaxel and Carboplatin weekly and will proceed with her third cycle  today.  She is not having any peripheral neuropathy and we are monitoring her closely for that.    Sareena is having issues with cramping.  Her diet hasn't been great, she admits she has been eating cake, and other sugary choices.  She has started eating yogurt, and we will see how she does with this over the next week.  I offered to get an abdominal xray and she wants to wait on this for now.  She knows to call if she changes her mind.    We will see her back weekly for her treatment, and she will have an office visit with myself or Dr. Jana Hakim with every other treatment.  She knows to call for any other issue that may develop before then  Total encounter time 20 minutes.Wilber Bihari, NP 10/29/19 1:25 PM Medical Oncology and Hematology [facility] [place], [place] [address] Tel. [phone]    Fax. [phone]   *Total Encounter Time as defined by the Centers for Medicare and Medicaid Services includes, in addition to the face-to-face time of a patient visit (documented in the note above) non-face-to-face time: obtaining and reviewing outside history, ordering and reviewing medications, tests or procedures, care coordination (communications with other health care professionals or caregivers) and documentation in the medical record.

## 2019-10-29 NOTE — Patient Instructions (Signed)
[facility] Discharge Instructions for Patients Receiving Chemotherapy  Today you received the following chemotherapy agents: paclitaxel and carboplatin.  To help prevent nausea and vomiting after your treatment, we encourage you to take your nausea medication as directed.   If you develop nausea and vomiting that is not controlled by your nausea medication, call the clinic.   BELOW ARE SYMPTOMS THAT SHOULD BE REPORTED IMMEDIATELY:  *FEVER GREATER THAN 100.5 F  *CHILLS WITH OR WITHOUT FEVER  NAUSEA AND VOMITING THAT IS NOT CONTROLLED WITH YOUR NAUSEA MEDICATION  *UNUSUAL SHORTNESS OF BREATH  *UNUSUAL BRUISING OR BLEEDING  TENDERNESS IN MOUTH AND THROAT WITH OR WITHOUT PRESENCE OF ULCERS  *URINARY PROBLEMS  *BOWEL PROBLEMS  UNUSUAL RASH Items with * indicate a potential emergency and should be followed up as soon as possible.  Feel free to call the clinic should you have any questions or concerns. The clinic phone number is [phone].  Please show the CHEMO ALERT CARD at check-in to the Emergency Department and triage nurse.   

## 2019-10-29 NOTE — Patient Instructions (Signed)

## 2019-10-30 ENCOUNTER — Telehealth: Payer: Self-pay | Admitting: Adult Health

## 2019-10-30 NOTE — Telephone Encounter (Signed)
No 6/23 los. No changes made to pt's schedule.

## 2019-10-31 ENCOUNTER — Telehealth: Payer: Self-pay | Admitting: Genetic Counselor

## 2019-10-31 NOTE — Telephone Encounter (Signed)
Provided Ms. Circle with instructions that she can give her sister to have her sister's genetic test records sent to [place] from [facility]. Ms. Nydam will pass the information along to her sister.

## 2019-11-05 ENCOUNTER — Inpatient Hospital Stay: Payer: BC Managed Care – PPO

## 2019-11-05 ENCOUNTER — Other Ambulatory Visit: Payer: Self-pay

## 2019-11-05 ENCOUNTER — Ambulatory Visit: Payer: BC Managed Care – PPO | Admitting: Adult Health

## 2019-11-05 VITALS — BP 144/74 | HR 78 | Temp 97.8°F | Resp 18 | Wt 202.2 lb

## 2019-11-05 DIAGNOSIS — C50211 Malignant neoplasm of upper-inner quadrant of right female breast: Secondary | ICD-10-CM

## 2019-11-05 DIAGNOSIS — Z170 Estrogen receptor positive status [ER+]: Secondary | ICD-10-CM

## 2019-11-05 LAB — CBC WITH DIFFERENTIAL/PLATELET
Abs Immature Granulocytes: 0.01 10*3/uL (ref 0.00–0.07)
Basophils Absolute: 0 10*3/uL (ref 0.0–0.1)
Basophils Relative: 1 %
Eosinophils Absolute: 0.1 10*3/uL (ref 0.0–0.5)
Eosinophils Relative: 3 %
HCT: 26.6 % — ABNORMAL LOW (ref 36.0–46.0)
Hemoglobin: 8.9 g/dL — ABNORMAL LOW (ref 12.0–15.0)
Immature Granulocytes: 0 %
Lymphocytes Relative: 28 %
Lymphs Abs: 0.9 10*3/uL (ref 0.7–4.0)
MCH: 32.2 pg (ref 26.0–34.0)
MCHC: 33.5 g/dL (ref 30.0–36.0)
MCV: 96.4 fL (ref 80.0–100.0)
Monocytes Absolute: 0.4 10*3/uL (ref 0.1–1.0)
Monocytes Relative: 11 %
Neutro Abs: 1.9 10*3/uL (ref 1.7–7.7)
Neutrophils Relative %: 57 %
Platelets: 156 10*3/uL (ref 150–400)
RBC: 2.76 MIL/uL — ABNORMAL LOW (ref 3.87–5.11)
RDW: 16.1 % — ABNORMAL HIGH (ref 11.5–15.5)
WBC: 3.3 10*3/uL — ABNORMAL LOW (ref 4.0–10.5)
nRBC: 0 % (ref 0.0–0.2)

## 2019-11-05 LAB — COMPREHENSIVE METABOLIC PANEL
ALT: 34 U/L (ref 0–44)
AST: 32 U/L (ref 15–41)
Albumin: 3.8 g/dL (ref 3.5–5.0)
Alkaline Phosphatase: 92 U/L (ref 38–126)
Anion gap: 9 (ref 5–15)
BUN: 14 mg/dL (ref 6–20)
CO2: 21 mmol/L — ABNORMAL LOW (ref 22–32)
Calcium: 9.1 mg/dL (ref 8.9–10.3)
Chloride: 110 mmol/L (ref 98–111)
Creatinine, Ser: 0.63 mg/dL (ref 0.44–1.00)
GFR calc Af Amer: >60 mL/min (ref >60)
GFR calc non Af Amer: >60 mL/min (ref >60)
Glucose, Bld: 87 mg/dL (ref 70–99)
Potassium: 4.1 mmol/L (ref 3.5–5.1)
Sodium: 140 mmol/L (ref 135–145)
Total Bilirubin: 0.3 mg/dL (ref 0.3–1.2)
Total Protein: 6.7 g/dL (ref 6.5–8.1)

## 2019-11-05 LAB — SAMPLE TO BLOOD BANK

## 2019-11-05 MED ORDER — DIPHENHYDRAMINE HCL 50 MG/ML IJ SOLN
25.0000 mg | Freq: Once | INTRAMUSCULAR | Status: AC
  Administered 2019-11-05: 25 mg via INTRAVENOUS

## 2019-11-05 MED ORDER — PALONOSETRON HCL INJECTION 0.25 MG/5ML
INTRAVENOUS | Status: AC
  Filled 2019-11-05: qty 5

## 2019-11-05 MED ORDER — SODIUM CHLORIDE 0.9 % IV SOLN
80.0000 mg/m2 | Freq: Once | INTRAVENOUS | Status: AC
  Administered 2019-11-05: 168 mg via INTRAVENOUS
  Filled 2019-11-05: qty 28

## 2019-11-05 MED ORDER — FAMOTIDINE IN NACL 20-0.9 MG/50ML-% IV SOLN
20.0000 mg | Freq: Once | INTRAVENOUS | Status: AC
  Administered 2019-11-05: 20 mg via INTRAVENOUS

## 2019-11-05 MED ORDER — FAMOTIDINE IN NACL 20-0.9 MG/50ML-% IV SOLN
INTRAVENOUS | Status: AC
  Filled 2019-11-05: qty 50

## 2019-11-05 MED ORDER — SODIUM CHLORIDE 0.9 % IV SOLN
273.2000 mg | Freq: Once | INTRAVENOUS | Status: AC
  Administered 2019-11-05: 270 mg via INTRAVENOUS
  Filled 2019-11-05: qty 27

## 2019-11-05 MED ORDER — SODIUM CHLORIDE 0.9% FLUSH
10.0000 mL | INTRAVENOUS | Status: DC | PRN
  Administered 2019-11-05: 10 mL
  Filled 2019-11-05: qty 10

## 2019-11-05 MED ORDER — SODIUM CHLORIDE 0.9 % IV SOLN
10.0000 mg | Freq: Once | INTRAVENOUS | Status: AC
  Administered 2019-11-05: 10 mg via INTRAVENOUS
  Filled 2019-11-05: qty 10

## 2019-11-05 MED ORDER — SODIUM CHLORIDE 0.9 % IV SOLN
Freq: Once | INTRAVENOUS | Status: AC
  Filled 2019-11-05: qty 250

## 2019-11-05 MED ORDER — PALONOSETRON HCL INJECTION 0.25 MG/5ML
0.2500 mg | Freq: Once | INTRAVENOUS | Status: AC
  Administered 2019-11-05: 0.25 mg via INTRAVENOUS

## 2019-11-05 MED ORDER — DIPHENHYDRAMINE HCL 50 MG/ML IJ SOLN
INTRAMUSCULAR | Status: AC
  Filled 2019-11-05: qty 1

## 2019-11-05 MED ORDER — HEPARIN SOD (PORK) LOCK FLUSH 100 UNIT/ML IV SOLN
500.0000 [IU] | Freq: Once | INTRAVENOUS | Status: AC | PRN
  Administered 2019-11-05: 500 [IU]
  Filled 2019-11-05: qty 5

## 2019-11-05 NOTE — Patient Instructions (Signed)
[facility] Discharge Instructions for Patients Receiving Chemotherapy  Today you received the following chemotherapy agents: paclitaxel and carboplatin.  To help prevent nausea and vomiting after your treatment, we encourage you to take your nausea medication as directed.   If you develop nausea and vomiting that is not controlled by your nausea medication, call the clinic.   BELOW ARE SYMPTOMS THAT SHOULD BE REPORTED IMMEDIATELY:  *FEVER GREATER THAN 100.5 F  *CHILLS WITH OR WITHOUT FEVER  NAUSEA AND VOMITING THAT IS NOT CONTROLLED WITH YOUR NAUSEA MEDICATION  *UNUSUAL SHORTNESS OF BREATH  *UNUSUAL BRUISING OR BLEEDING  TENDERNESS IN MOUTH AND THROAT WITH OR WITHOUT PRESENCE OF ULCERS  *URINARY PROBLEMS  *BOWEL PROBLEMS  UNUSUAL RASH Items with * indicate a potential emergency and should be followed up as soon as possible.  Feel free to call the clinic should you have any questions or concerns. The clinic phone number is [phone].  Please show the CHEMO ALERT CARD at check-in to the Emergency Department and triage nurse.   

## 2019-11-07 ENCOUNTER — Telehealth: Payer: Self-pay | Admitting: Genetic Counselor

## 2019-11-07 ENCOUNTER — Encounter: Payer: Self-pay | Admitting: Genetic Counselor

## 2019-11-07 ENCOUNTER — Ambulatory Visit: Payer: Self-pay | Admitting: Genetic Counselor

## 2019-11-07 DIAGNOSIS — Z1379 Encounter for other screening for genetic and chromosomal anomalies: Secondary | ICD-10-CM | POA: Insufficient documentation

## 2019-11-07 NOTE — Progress Notes (Signed)
HPI:  Ms. Herrle was previously seen in the [place] clinic due to a personal and family history of cancer and concerns regarding a hereditary predisposition to cancer. Please refer to our prior cancer genetics clinic note for more information regarding our discussion, assessment and recommendations, at the time. Ms. Karpowicz recent genetic test results were disclosed to her, as were recommendations warranted by these results. These results and recommendations are discussed in more detail below.  CANCER HISTORY:  Oncology History  Malignant neoplasm of upper-inner quadrant of right breast in female, estrogen receptor positive ([place])  07/23/2019 Initial Diagnosis   Malignant neoplasm of upper-inner quadrant of right breast in female, estrogen receptor positive ([place])   08/07/2019 -  Chemotherapy   The patient had dexamethasone (DECADRON) 4 MG tablet, 1 of 1 cycle, Start date: 07/30/2019, End date: 10/29/2019 DOXOrubicin (ADRIAMYCIN) chemo injection 126 mg, 60 mg/m2 = 126 mg, Intravenous,  Once, 4 of 4 cycles Administration: 126 mg (08/07/2019), 126 mg (08/21/2019), 126 mg (09/04/2019), 126 mg (09/18/2019) palonosetron (ALOXI) injection 0.25 mg, 0.25 mg, Intravenous,  Once, 6 of 8 cycles Administration: 0.25 mg (08/07/2019), 0.25 mg (10/08/2019), 0.25 mg (08/21/2019), 0.25 mg (09/04/2019), 0.25 mg (09/18/2019), 0.25 mg (10/29/2019), 0.25 mg (10/15/2019), 0.25 mg (10/23/2019), 0.25 mg (11/05/2019) pegfilgrastim (NEULASTA ONPRO KIT) injection 6 mg, 6 mg, Subcutaneous, Once, 1 of 1 cycle Administration: 6 mg (08/08/2019) pegfilgrastim-jmdb (FULPHILA) injection 6 mg, 6 mg, Subcutaneous,  Once, 3 of 3 cycles Administration: 6 mg (08/23/2019), 6 mg (09/06/2019), 6 mg (09/20/2019) CARBOplatin (PARAPLATIN) 270 mg in sodium chloride 0.9 % 250 mL chemo infusion, 270 mg (100 % of original dose 273.2 mg), Intravenous,  Once, 2 of 4 cycles Dose modification:   (original dose 273.2 mg, Cycle 5), 273.2 mg (original dose  273.2 mg, Cycle 5) Administration: 270 mg (10/08/2019), 270 mg (10/15/2019), 270 mg (10/23/2019), 270 mg (10/29/2019), 270 mg (11/05/2019) cyclophosphamide (CYTOXAN) 1,260 mg in sodium chloride 0.9 % 250 mL chemo infusion, 600 mg/m2 = 1,260 mg, Intravenous,  Once, 4 of 4 cycles Administration: 1,260 mg (08/07/2019), 1,260 mg (08/21/2019), 1,260 mg (09/04/2019), 1,260 mg (09/18/2019) PACLitaxel (TAXOL) 168 mg in sodium chloride 0.9 % 250 mL chemo infusion (</= 60m/m2), 80 mg/m2 = 168 mg, Intravenous,  Once, 2 of 4 cycles Administration: 168 mg (10/08/2019), 168 mg (10/15/2019), 168 mg (10/23/2019), 168 mg (10/29/2019), 168 mg (11/05/2019) fosaprepitant (EMEND) 150 mg in sodium chloride 0.9 % 145 mL IVPB, 150 mg, Intravenous,  Once, 5 of 5 cycles Administration: 150 mg (08/07/2019), 150 mg (10/08/2019), 150 mg (08/21/2019), 150 mg (09/04/2019), 150 mg (09/18/2019), 150 mg (10/15/2019), 150 mg (10/23/2019)  for chemotherapy treatment.    11/06/2019 Genetic Testing   Negative genetic testing:  No pathogenic variants detected on the [facility] Multi-Cancer Panel + Wilms Tumor Panel. Three variants of uncertain significance were detected - one in the AXIN2 gene called c.13A>G, one in the RNF43 gene called c.1770G>C, and [facility] called [facility]G. The report date is 11/06/2019.  The Multi-Cancer Panel offered by [facility] includes sequencing and/or deletion duplication testing of the following 85 genes: AIP, ALK, APC, ATM, AXIN2,BAP1,  BARD1, BLM, BMPR1A, BRCA1, BRCA2, BRIP1, CASR, CDC73, CDH1, CDK4, CDKN1B, CDKN1C, CDKN2A (p14ARF), CDKN2A (p16INK4a), CEBPA, CHEK2, CTNNA1, DICER1, DIS3L2, EGFR (c.2369C>T, p.Thr790Met variant only), EPCAM (Deletion/duplication testing only), FH, FLCN, GATA2, GPC3, GREM1 (Promoter region deletion/duplication testing only), HOXB13 (c.251G>A, p.Gly84Glu), HRAS, KIT, MAX, MEN1, MET, MITF (c.952G>A, p.Glu318Lys variant only), MLH1, MSH2, MSH3, MSH6, MUTYH, NBN, NF1, NF2, NTHL1,  PALB2, PDGFRA, PHOX2B, PMS2,  POLD1, POLE, POT1, PRKAR1A, PTCH1, PTEN, RAD50, RAD51C, RAD51D, RB1, RECQL4, RET, RNF43, RUNX1, SDHAF2, SDHA (sequence changes only), SDHB, SDHC, SDHD, SMAD4, SMARCA4, SMARCB1, SMARCE1, STK11, SUFU, TERC, TERT, TMEM127, TP53, TSC1, TSC2, VHL, WRN and WT1. The Wilms Tumor Panel offered by [facility] includes sequencing and deletion/duplication testing of the following 7 genes: CDC73, CDKN1C, CTR9, DIS3L2, GPC3, REST, and WT1.     FAMILY HISTORY:  We obtained a detailed, 4-generation family history.  Significant diagnoses are listed below: Family History  Problem Relation Age of Onset  . Skin cancer Sister        non-melanoma dx. in her 53s  . Other Sister        normal genetic testing for hereditary cancer risks  . Colon cancer Paternal Grandfather        dx. in his 64s  . Wilm's tumor Son        dx. 9 months  . Breast cancer Other        dx. in her 4s (PGM's sister)  . Wilm's tumor Nephew 2  . Breast cancer Other        dx. in her 38s (PGM's niece)   Ms. Kassa has one daughter (age 18) and two sons (ages 22 and 6). Her older son had a history of a Wilms tumor diagnosed when he was 52 months old, and does not have any other physical abnormalities suggestive of a genetic syndrome. Ms. Gauer has three sisters (ages 43, 32, and 36). One sister has a history of non-melanoma skin cancer diagnosed in her 45s, and has also had negative genetic testing for a hereditary cancer gene panel (report not reviewed at today's visit). Ms. Aven has a nephew who was diagnosed with a Wilms tumor when he was 60 years old.   Ms. Mcclenahan mother died at the age of 105 due to domestic violence, and did not have cancer. Ms. Pardo did not have any maternal aunts or uncles. Her maternal grandmother died at the age of 9, also due to domestic violence, and her maternal grandfather died in his 7s. There are no known diagnoses of cancer on the maternal side of the family.  Ms. Acord father is 23 and has not had  cancer. She has one paternal aunt and two paternal uncles. One uncle died in his 36s from spleen/immune problems. Her maternal grandfather was diagnosed with colon cancer in his 58s, and died in his late 52s or early 36s. Her maternal grandmother died in her late 42s or early 11s and did not have cancer. Ms. Saling maternal grandmother had a sister who was diagnosed with breast cancer in her 15s, and a niece who was diagnosed with breast cancer in her 69s.  Ms. Borden is aware of previous family history of genetic testing for hereditary cancer risks in her sister, which was normal. Patient's ancestors are of [place], [place], and [place] descent. There is no reported Ashkenazi Jewish ancestry. There is no known consanguinity.  GENETIC TEST RESULTS: Genetic testing reported out on 11/06/2019 through the [facility] Multi-Cancer Panel + Wilms Tumor Panel. No pathogenic variants were detected.   The Multi-Cancer Panel offered by [facility] includes sequencing and/or deletion duplication testing of the following 85 genes: AIP, ALK, APC, ATM, AXIN2,BAP1,  BARD1, BLM, BMPR1A, BRCA1, BRCA2, BRIP1, CASR, CDC73, CDH1, CDK4, CDKN1B, CDKN1C, CDKN2A (p14ARF), CDKN2A (p16INK4a), CEBPA, CHEK2, CTNNA1, DICER1, DIS3L2, EGFR (c.2369C>T, p.Thr790Met variant only), EPCAM (Deletion/duplication testing only), FH, FLCN, GATA2, GPC3, GREM1 (Promoter region  deletion/duplication testing only), HOXB13 (c.251G>A, p.Gly84Glu), HRAS, KIT, MAX, MEN1, MET, MITF (c.952G>A, p.Glu318Lys variant only), MLH1, MSH2, MSH3, MSH6, MUTYH, NBN, NF1, NF2, NTHL1, PALB2, PDGFRA, PHOX2B, PMS2, POLD1, POLE, POT1, PRKAR1A, PTCH1, PTEN, RAD50, RAD51C, RAD51D, RB1, RECQL4, RET, RNF43, RUNX1, SDHAF2, SDHA (sequence changes only), SDHB, SDHC, SDHD, SMAD4, SMARCA4, SMARCB1, SMARCE1, STK11, SUFU, TERC, TERT, TMEM127, TP53, TSC1, TSC2, VHL, WRN and WT1. The Wilms Tumor Panel offered by [facility] includes sequencing and deletion/duplication testing of the following 7 genes:  CDC73, CDKN1C, CTR9, DIS3L2, GPC3, REST, and WT1. The test report will be scanned into EPIC and located under the Molecular Pathology section of the Results Review tab.  A portion of the result report is included below for reference.     We discussed with Ms. Kalata that because current genetic testing is not perfect, it is possible there may be a gene mutation in one of these genes that current testing cannot detect, but that chance is small.  We also discussed that there could be another gene that has not yet been discovered, or that we have not yet tested, that is responsible for the cancer diagnoses in the family. It is also possible there is a hereditary cause for the cancer in the family that Ms. Sittner did not inherit and therefore was not identified in her testing.  Therefore, it is important to remain in touch with cancer genetics in the future so that we can continue to offer Ms. Giancola the most up to date genetic testing.   Genetic testing did identify three variants of uncertain significance (VUS) - one in the AXIN2 gene called c.13A>G, a second in the RNF43 gene called c.1770G>C, and a third in the [facility] gene called c.85C>G.  At this time, it is unknown if these variants are associated with increased cancer risk or if they are normal findings, but most variants such as these get reclassified to being inconsequential. They should not be used to make medical management decisions. With time, we suspect the lab will determine the significance of these variants, if any. If we do learn more about them, we will try to contact Ms. Sautter to discuss it further. However, it is important to stay in touch with [place] periodically and keep the address and phone number up to date.  CANCER SCREENING RECOMMENDATIONS: Ms. Skilling test result is considered negative (normal).  This means that we have not identified a hereditary cause for her personal and family history of cancer at this time. Most cancers happen by  chance and this negative test suggests that her personal and family of cancer may fall into this category.    While reassuring, this does not definitively rule out a hereditary predisposition to cancer. It is still possible that there could be genetic mutations that are undetectable by current technology. There could be genetic mutations in genes that have not been tested or identified to increase cancer risk.  Therefore, it is recommended she continue to follow the cancer management and screening guidelines provided by her oncology and primary healthcare providers.   An individual's cancer risk and medical management are not determined by genetic test results alone. Overall cancer risk assessment incorporates additional factors, including personal medical history, family history, and any available genetic information that may result in a personalized plan for cancer prevention and surveillance.  RECOMMENDATIONS FOR FAMILY MEMBERS:  Individuals in this family might be at some increased risk of developing cancer, over the general population risk, simply due to the family history  of cancer.  We recommended women in this family have a yearly mammogram beginning at age 57, or 59 years younger than the earliest onset of cancer, an annual clinical breast exam, and perform monthly breast self-exams. Women in this family should also have a gynecological exam as recommended by their primary provider. All family members should be referred for colonoscopy starting at age 52.  FOLLOW-UP: Lastly, we discussed with Ms. Pennings that cancer genetics is a rapidly advancing field and it is possible that new genetic tests will be appropriate for her and/or her family members in the future. We encouraged her to remain in contact with cancer genetics on an annual basis so we can update her personal and family histories and let her know of advances in cancer genetics that may benefit this family.   Our contact number was  provided. Ms. Tortorelli questions were answered to her satisfaction, and she knows she is welcome to call us at anytime with additional questions or concerns.   Clint Guy, MS, [facility] Genetic Counselor [email or web address] Phone: [phone]

## 2019-11-07 NOTE — Telephone Encounter (Signed)
Revealed negative genetic testing. Discussed that we do not know why she has breast cancer or why there is cancer in the family. There could be a genetic mutation in the family that Debra [name] did not inherit. There could also be a mutation in a different gene that we are not testing, or our current technology may not be able detect certain mutations. It will therefore be important for her to stay in contact with genetics to keep up with whether additional testing may be appropriate in the future.   Three variants of uncertain significance were detected - one in the AXIN2 gene called c.13A>G, a second in the RNF43 gene called c.1770G>C, and a third in the [facility] gene called c.85C>G. Her results are still considered normal at this time and should not impact her medical management.

## 2019-11-10 NOTE — Progress Notes (Signed)
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   She started on weekly carboplatin and paclitaxel on 10/08/2019. Debra [name] is week 6.  She is doing quite well Debra [name]  She is having some mild lower abdominal cramping that starts two days prior to treatment.  She is passing gas and having regular bowel movements.  This cramping occurs every time she eats.     She recently underwent genetic counseling on 10/28/2019. Since her last visit, the results returned negative with three variants of uncertain significance, in [place], RNF43, and [place].   REVIEW OF SYSTEMS: Debra [name] generally feels well.  She has had a little more fatigue she thinks.  Nevertheless she walks most days.  She has had absolutely no peripheral neuropathy symptoms although she feels like her fingers are "pruny".  They have normal sensation and no tingling.  Toes are fine as well.  She does have some lower abdominal cramps which usually preceded bowel movement and she has a bowel movement most days.  They are otherwise normal.  Taking yogurt is helping and we suggested she try probiotics also.  A detailed review of systems Debra [name] was otherwise stable   HISTORY OF CURRENT ILLNESS: From the original intake  note:  Debra [name] herself palpated a mass in the upper-inner right breast in February 2021.  It appeared to increase in size over the next 3 weeks. Physical exam performed at [facility] 07/15/2019 confirmed a 6 mm firm, oval, palpable mass in the upper-inner right breast. She underwent bilateral diagnostic mammography with tomography and bilateral breast ultrasonography on 07/15/2019 showing: breast density category C; 4.6 cm mass in the right breast at 1 o'clock; single abnormal-appearing lymph node; bilateral benign cysts.  Accordingly on 07/21/2019 she proceeded to biopsy of the right breast area in question. 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 Prognostic indicators significant for: estrogen receptor, 60% positive with weak staining intensity and progesterone receptor, 0% negative. Proliferation marker Ki67 at 90%. HER2 equivocal by immunohistochemistry (2+), but negative by fluorescent in situ hybridization with a signals ratio 1.24 and number per cell 2.55.  The questionable right axillary lymph node was biopsied as well and was benign (concordant).  The patient's subsequent history is as detailed below.   PAST MEDICAL HISTORY: Past Medical History:  Diagnosis Date  . Family history of breast Debra [name]   . Family history of colon Debra [name]   . Family history of nonmelanoma skin Debra [name]     PAST SURGICAL HISTORY: Past Surgical History:  Procedure Laterality Date  . ABDOMINAL HYSTERECTOMY    . LUNG BIOPSY    . PORTACATH PLACEMENT Left 08/06/2019   Procedure: INSERTION PORT-A-CATH WITH ULTRASOUND GUIDANCE;  Surgeon: [name], MD;  Location: [facility];  Service: General;  Laterality: Left;  Status post bilateral salpingo-oophorectomy   FAMILY  HISTORY: Family History  Problem Relation Age of Onset  . Skin Debra [name] Sister        non-melanoma dx. in her 56s  . Other  Sister        normal genetic testing for hereditary Debra [name] risks  . Colon Debra [name] Paternal Grandfather        dx. in his 6s  . Wilm's tumor Son        dx. 9 months  . Breast Debra [name] Other        dx. in her 80s (PGM's sister)  . Wilm's tumor Nephew 2  . Breast Debra [name] Other        dx. in her 42s (PGM's niece)    Her father is 57 years old as of 07/2019. Her mother was murdered at age 60 (domestic violence).  The patient has 3 sisters and no brothers. She reports a third cousin (grandmother's sister's daughter) with breast Debra [name] in her 49's. She denies a family history of ovarian, prostate, or pancreatic Debra [name] She does report colon Debra [name] in her paternal grandfather in his 58's, non-melanoma skin Debra [name] in her sister in her 68's, and Wilms tumor in her son at 24 months.   GYNECOLOGIC HISTORY:  Patient's last menstrual period was 05/09/2011. Menarche: 50-49 years old Age at first live birth: 59 years old Debra [name] 3 LMP 2013 Contraceptive never used HRT used for approximately 9 months  Hysterectomy? Yes, 2013 BSO? 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 Husband Debra Debra [name] is an Art gallery manager. She lives at home with husband Debra Debra [name] Daughter Debra Debra [name], age 3, is a poet and Pharmacist, hospital in [place], [place]. Son Debra Debra [name], age 65, is an Production designer, theatre/television/film in [place] in [place], [place]. Son Debra Debra [name], age 73, is a Marketing executive working in Artist in [place], [place] (at the [employer]). Debra [name] has three grandchildren. She is a Media planner.    ADVANCED DIRECTIVES: In the absence of any documentation to the contrary, the patient's spouse is their HCPOA.    HEALTH MAINTENANCE: Social History   Tobacco Use  . Smoking status: Former Research scientist (life sciences)  . Smokeless tobacco: Never Used  Substance Use Topics  . Alcohol use: Yes  . Drug use: Never     Colonoscopy: none on file  PAP: none on file (s/[name]  hysterectomy)  Bone density: n/a (age)   Allergies  Allergen Reactions  . Other Rash    Walnuts give her a rash Walnuts give her a rash Walnuts give her a rash Walnuts give her a rash     Current Outpatient Medications  Medication Sig Dispense Refill  . lidocaine-prilocaine (EMLA) cream Apply to affected area once 30 g 3  . LORazepam (ATIVAN) 0.5 MG tablet Take 1 tablet (0.5 mg total) by mouth at bedtime as needed (Nausea or vomiting). 30 tablet 0  . Multiple Vitamin (MULTI-VITAMIN DAILY PO) Take by mouth.    . prochlorperazine (COMPAZINE) 10 MG tablet Take 1 tablet (10 mg total) by mouth every 6 (six) hours as needed (Nausea or vomiting). 30 tablet 1  . traMADol (ULTRAM) 50 MG tablet Take 1 tablet (50 mg total) by mouth every 6 (six) hours as needed for moderate pain or severe pain. 20 tablet 0  . UNABLE TO FIND Omega Krill Oil     No current facility-administered medications for this visit.    OBJECTIVE: White woman in no acute distress  There were no vitals filed for  this visit.   There is no height or weight on file to calculate BMI.   Wt Readings from Last 3 Encounters:  11/05/19 202 lb 4 oz (91.7 kg)  10/29/19 200 lb 8 oz (90.9 kg)  10/15/19 205 lb 9.6 oz (93.3 kg)     ECOG FS:1 - Symptomatic but completely ambulatory  Sclerae unicteric, EOMs intact Wearing a mask No cervical or supraclavicular adenopathy Lungs no rales or rhonchi Heart regular rate and rhythm Abd soft, nontender, positive bowel sounds MSK no focal spinal tenderness, no upper extremity lymphedema Neuro: nonfocal, well oriented, appropriate affect Breasts: I no longer palpate a mass in the right breast.  The left breast is benign.  Both axillae are benign.   LAB RESULTS:  CMP     Component Value Date/Time   NA 140 11/05/2019 1232   K 4.1 11/05/2019 1232   CL 110 11/05/2019 1232   CO2 21 (L) 11/05/2019 1232   GLUCOSE 87 11/05/2019 1232   BUN 14 11/05/2019 1232   CREATININE 0.63 11/05/2019  1232   CREATININE 0.76 07/30/2019 0825   CALCIUM 9.1 11/05/2019 1232   PROT 6.7 11/05/2019 1232   ALBUMIN 3.8 11/05/2019 1232   AST 32 11/05/2019 1232   AST 14 (L) 07/30/2019 0825   ALT 34 11/05/2019 1232   ALT 11 07/30/2019 0825   ALKPHOS 92 11/05/2019 1232   BILITOT 0.3 11/05/2019 1232   BILITOT 0.4 07/30/2019 0825   GFRNONAA >60 11/05/2019 1232   GFRNONAA >60 07/30/2019 0825   GFRAA >60 11/05/2019 1232   GFRAA >60 07/30/2019 0825    No results found for: TOTALPROTELP, ALBUMINELP, A1GS, A2GS, BETS, BETA2SER, GAMS, MSPIKE, SPEI  Lab Results  Component Value Date   WBC 3.3 (L) 11/05/2019   NEUTROABS 1.9 11/05/2019   HGB 8.9 (L) 11/05/2019   HCT 26.6 (L) 11/05/2019   MCV 96.4 11/05/2019   PLT 156 11/05/2019    No results found for: LABCA2  No components found for: HGDJME268  No results for input(s): INR in the last 168 hours.  No results found for: LABCA2  No results found for: TMH962  No results found for: IWL798  No results found for: XQJ194  No results found for: CA2729  No components found for: HGQUANT  No results found for: CEA1 / No results found for: CEA1   No results found for: AFPTUMOR  No results found for: CHROMOGRNA  No results found for: KPAFRELGTCHN, LAMBDASER, KAPLAMBRATIO (kappa/lambda light chains)  No results found for: HGBA, HGBA2QUANT, HGBFQUANT, HGBSQUAN (Hemoglobinopathy evaluation)   No results found for: LDH  No results found for: IRON, TIBC, IRONPCTSAT (Iron and TIBC)  No results found for: FERRITIN  Urinalysis No results found for: COLORURINE, APPEARANCEUR, LABSPEC, PHURINE, GLUCOSEU, HGBUR, BILIRUBINUR, KETONESUR, PROTEINUR, UROBILINOGEN, NITRITE, LEUKOCYTESUR   STUDIES: No results found.   ELIGIBLE FOR AVAILABLE RESEARCH PROTOCOL: AET  ASSESSMENT: 59 y.o. 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  (2) definitive surgery to follow  (3) adjuvant radiation to follow-up surgery as appropriate  (4) genetics testing 11/06/2019 through the [facility] Multi-Debra [name] Panel + Wilms Tumor Panel found no  deleterious mutations in AIP, ALK, APC, ATM, AXIN2,BAP1,  BARD1, BLM, BMPR1A, BRCA1, BRCA2, BRIP1, CASR, CDC73, CDH1, CDK4, CDKN1B, CDKN1C, CDKN2A (p14ARF), CDKN2A (p16INK4a), CEBPA, CHEK2, CTNNA1, DICER1, DIS3L2, EGFR (c.2369C>T, [name].Thr790Met variant only), EPCAM (Deletion/duplication testing only), FH, FLCN, GATA2, GPC3, GREM1 (Promoter region deletion/duplication testing only), HOXB13 (c.251G>A, [name].Gly84Glu), HRAS, KIT, MAX, MEN1, MET, MITF (c.952G>A, [name].Glu318Lys variant only), MLH1, MSH2, MSH3, MSH6, MUTYH, NBN, NF1, NF2, NTHL1, PALB2, PDGFRA, PHOX2B, PMS2, POLD1, POLE, POT1, PRKAR1A, PTCH1, PTEN, RAD50, RAD51C, RAD51D, RB1, RECQL4, RET, RNF43, RUNX1, SDHAF2, SDHA (sequence changes only), SDHB, SDHC, SDHD, SMAD4, SMARCA4, SMARCB1, SMARCE1, STK11, SUFU, TERC, TERT, TMEM127, TP53, TSC1, TSC2, VHL, WRN and WT1. The Wilms Tumor Panel offered by [facility] includes sequencing and deletion/duplication testing of the following 7 genes: CDC73, CDKN1C, CTR9, DIS3L2, GPC3, REST, and WT1.  (a) Three variants of uncertain significance were detected - one in the AXIN2 gene called c.13A>G, one in the RNF43 gene called c.1770G>C, and one in the [facility] gene called c.85C>G.    PLAN: Debra Debra [name], of 12 planned.  So far she has had no peripheral neuropathy and otherwise is tolerating it well.  It could be that the abdominal  discomfort she is experiencing before bowel movements is related to the paclitaxel.  In any case it is improved with a mild change in diet and she is going to try probiotics as well.  I have commended her exercise routine and encouraged her to intensify it  We will continue to see her every other treatment until her eighth treatment and thereafter we will see her on a weekly basis until she completes her therapy  I think it based on my physical exam when she gets her surgery she will get some very good pathologic news  Total encounter time 25 minutes.Sarajane Jews C. Ninette Cotta, MD 11/11/19 12:10 PM Medical Oncology and Hematology [facility] [place], [place] [address] Tel. [phone]    Fax. [phone]   I, Wilburn Mylar, am acting as scribe for Dr. [name]. Cornelius Marullo.  I, Lurline Del MD, have reviewed the above documentation for accuracy and completeness, and I agree with the above.    *Total Encounter Time as defined by the Centers for Medicare and Medicaid Services includes, in addition to the face-to-face time of a patient visit (documented in the note above) non-face-to-face time: obtaining and reviewing outside history, ordering and reviewing medications, tests or procedures, care coordination (communications with other health care professionals or caregivers) and documentation in the medical record.

## 2019-11-11 ENCOUNTER — Inpatient Hospital Stay: Payer: BC Managed Care – PPO

## 2019-11-11 ENCOUNTER — Inpatient Hospital Stay: Payer: BC Managed Care – PPO | Attending: Oncology

## 2019-11-11 ENCOUNTER — Encounter: Payer: Self-pay | Admitting: *Deleted

## 2019-11-11 ENCOUNTER — Other Ambulatory Visit: Payer: Self-pay

## 2019-11-11 ENCOUNTER — Inpatient Hospital Stay (HOSPITAL_BASED_OUTPATIENT_CLINIC_OR_DEPARTMENT_OTHER): Payer: BC Managed Care – PPO | Admitting: Oncology

## 2019-11-11 VITALS — BP 150/90 | HR 83 | Temp 98.0°F | Resp 20 | Ht 69.5 in | Wt 200.3 lb

## 2019-11-11 DIAGNOSIS — Z170 Estrogen receptor positive status [ER+]: Secondary | ICD-10-CM

## 2019-11-11 DIAGNOSIS — Z87891 Personal history of nicotine dependence: Secondary | ICD-10-CM | POA: Insufficient documentation

## 2019-11-11 DIAGNOSIS — Z803 Family history of malignant neoplasm of breast: Secondary | ICD-10-CM | POA: Diagnosis not present

## 2019-11-11 DIAGNOSIS — C50211 Malignant neoplasm of upper-inner quadrant of right female breast: Secondary | ICD-10-CM | POA: Insufficient documentation

## 2019-11-11 DIAGNOSIS — Z5189 Encounter for other specified aftercare: Secondary | ICD-10-CM | POA: Insufficient documentation

## 2019-11-11 DIAGNOSIS — Z171 Estrogen receptor negative status [ER-]: Secondary | ICD-10-CM | POA: Insufficient documentation

## 2019-11-11 DIAGNOSIS — Z5112 Encounter for antineoplastic immunotherapy: Secondary | ICD-10-CM | POA: Diagnosis not present

## 2019-11-11 DIAGNOSIS — R5383 Other fatigue: Secondary | ICD-10-CM | POA: Diagnosis not present

## 2019-11-11 DIAGNOSIS — Z90710 Acquired absence of both cervix and uterus: Secondary | ICD-10-CM | POA: Insufficient documentation

## 2019-11-11 DIAGNOSIS — Z808 Family history of malignant neoplasm of other organs or systems: Secondary | ICD-10-CM | POA: Diagnosis not present

## 2019-11-11 DIAGNOSIS — Z800 Family history of malignant neoplasm of digestive organs: Secondary | ICD-10-CM | POA: Insufficient documentation

## 2019-11-11 DIAGNOSIS — R1030 Lower abdominal pain, unspecified: Secondary | ICD-10-CM | POA: Insufficient documentation

## 2019-11-11 DIAGNOSIS — G629 Polyneuropathy, unspecified: Secondary | ICD-10-CM | POA: Diagnosis not present

## 2019-11-11 LAB — COMPREHENSIVE METABOLIC PANEL
ALT: 47 U/L — ABNORMAL HIGH (ref 0–44)
AST: 42 U/L — ABNORMAL HIGH (ref 15–41)
Albumin: 4.1 g/dL (ref 3.5–5.0)
Alkaline Phosphatase: 95 U/L (ref 38–126)
Anion gap: 8 (ref 5–15)
BUN: 13 mg/dL (ref 6–20)
CO2: 22 mmol/L (ref 22–32)
Calcium: 9.6 mg/dL (ref 8.9–10.3)
Chloride: 106 mmol/L (ref 98–111)
Creatinine, Ser: 0.68 mg/dL (ref 0.44–1.00)
GFR calc Af Amer: >60 mL/min (ref >60)
GFR calc non Af Amer: >60 mL/min (ref >60)
Glucose, Bld: 82 mg/dL (ref 70–99)
Potassium: 4.2 mmol/L (ref 3.5–5.1)
Sodium: 136 mmol/L (ref 135–145)
Total Bilirubin: 0.4 mg/dL (ref 0.3–1.2)
Total Protein: 7 g/dL (ref 6.5–8.1)

## 2019-11-11 LAB — SAMPLE TO BLOOD BANK

## 2019-11-11 LAB — CBC WITH DIFFERENTIAL/PLATELET
Abs Immature Granulocytes: 0.01 10*3/uL (ref 0.00–0.07)
Basophils Absolute: 0 10*3/uL (ref 0.0–0.1)
Basophils Relative: 1 %
Eosinophils Absolute: 0 10*3/uL (ref 0.0–0.5)
Eosinophils Relative: 1 %
HCT: 27.2 % — ABNORMAL LOW (ref 36.0–46.0)
Hemoglobin: 9.1 g/dL — ABNORMAL LOW (ref 12.0–15.0)
Immature Granulocytes: 0 %
Lymphocytes Relative: 33 %
Lymphs Abs: 1 10*3/uL (ref 0.7–4.0)
MCH: 31.8 pg (ref 26.0–34.0)
MCHC: 33.5 g/dL (ref 30.0–36.0)
MCV: 95.1 fL (ref 80.0–100.0)
Monocytes Absolute: 0.3 10*3/uL (ref 0.1–1.0)
Monocytes Relative: 9 %
Neutro Abs: 1.6 10*3/uL — ABNORMAL LOW (ref 1.7–7.7)
Neutrophils Relative %: 56 %
Platelets: 140 10*3/uL — ABNORMAL LOW (ref 150–400)
RBC: 2.86 MIL/uL — ABNORMAL LOW (ref 3.87–5.11)
RDW: 15.6 % — ABNORMAL HIGH (ref 11.5–15.5)
WBC: 2.9 10*3/uL — ABNORMAL LOW (ref 4.0–10.5)
nRBC: 0 % (ref 0.0–0.2)

## 2019-11-11 MED ORDER — PALONOSETRON HCL INJECTION 0.25 MG/5ML
0.2500 mg | Freq: Once | INTRAVENOUS | Status: AC
  Administered 2019-11-11: 0.25 mg via INTRAVENOUS

## 2019-11-11 MED ORDER — SODIUM CHLORIDE 0.9 % IV SOLN
80.0000 mg/m2 | Freq: Once | INTRAVENOUS | Status: AC
  Administered 2019-11-11: 168 mg via INTRAVENOUS
  Filled 2019-11-11: qty 28

## 2019-11-11 MED ORDER — SODIUM CHLORIDE 0.9% FLUSH
10.0000 mL | INTRAVENOUS | Status: DC | PRN
  Administered 2019-11-11: 10 mL
  Filled 2019-11-11: qty 10

## 2019-11-11 MED ORDER — DIPHENHYDRAMINE HCL 50 MG/ML IJ SOLN
INTRAMUSCULAR | Status: AC
  Filled 2019-11-11: qty 1

## 2019-11-11 MED ORDER — PALONOSETRON HCL INJECTION 0.25 MG/5ML
INTRAVENOUS | Status: AC
  Filled 2019-11-11: qty 5

## 2019-11-11 MED ORDER — FAMOTIDINE IN NACL 20-0.9 MG/50ML-% IV SOLN
INTRAVENOUS | Status: AC
  Filled 2019-11-11: qty 50

## 2019-11-11 MED ORDER — FAMOTIDINE IN NACL 20-0.9 MG/50ML-% IV SOLN
20.0000 mg | Freq: Once | INTRAVENOUS | Status: AC
  Administered 2019-11-11: 20 mg via INTRAVENOUS

## 2019-11-11 MED ORDER — SODIUM CHLORIDE 0.9 % IV SOLN
10.0000 mg | Freq: Once | INTRAVENOUS | Status: AC
  Administered 2019-11-11: 10 mg via INTRAVENOUS
  Filled 2019-11-11: qty 10

## 2019-11-11 MED ORDER — DIPHENHYDRAMINE HCL 50 MG/ML IJ SOLN
25.0000 mg | Freq: Once | INTRAMUSCULAR | Status: AC
  Administered 2019-11-11: 25 mg via INTRAVENOUS

## 2019-11-11 MED ORDER — SODIUM CHLORIDE 0.9 % IV SOLN
Freq: Once | INTRAVENOUS | Status: AC
  Filled 2019-11-11: qty 250

## 2019-11-11 MED ORDER — HEPARIN SOD (PORK) LOCK FLUSH 100 UNIT/ML IV SOLN
500.0000 [IU] | Freq: Once | INTRAVENOUS | Status: AC | PRN
  Administered 2019-11-11: 500 [IU]
  Filled 2019-11-11: qty 5

## 2019-11-11 MED ORDER — SODIUM CHLORIDE 0.9 % IV SOLN
273.2000 mg | Freq: Once | INTRAVENOUS | Status: AC
  Administered 2019-11-11: 270 mg via INTRAVENOUS
  Filled 2019-11-11: qty 27

## 2019-11-11 NOTE — Patient Instructions (Signed)
[facility] Discharge Instructions for Patients Receiving Chemotherapy  Today you received the following chemotherapy agents: taxol, carboplatin   To help prevent nausea and vomiting after your treatment, we encourage you to take your nausea medication as directed.    If you develop nausea and vomiting that is not controlled by your nausea medication, call the clinic.   BELOW ARE SYMPTOMS THAT SHOULD BE REPORTED IMMEDIATELY:  *FEVER GREATER THAN 100.5 F  *CHILLS WITH OR WITHOUT FEVER  NAUSEA AND VOMITING THAT IS NOT CONTROLLED WITH YOUR NAUSEA MEDICATION  *UNUSUAL SHORTNESS OF BREATH  *UNUSUAL BRUISING OR BLEEDING  TENDERNESS IN MOUTH AND THROAT WITH OR WITHOUT PRESENCE OF ULCERS  *URINARY PROBLEMS  *BOWEL PROBLEMS  UNUSUAL RASH Items with * indicate a potential emergency and should be followed up as soon as possible.  Feel free to call the clinic should you have any questions or concerns. The clinic phone number is [phone].  Please show the CHEMO ALERT CARD at check-in to the Emergency Department and triage nurse.   

## 2019-11-12 ENCOUNTER — Ambulatory Visit: Payer: BC Managed Care – PPO

## 2019-11-12 ENCOUNTER — Other Ambulatory Visit: Payer: BC Managed Care – PPO

## 2019-11-12 ENCOUNTER — Telehealth: Payer: Self-pay | Admitting: Oncology

## 2019-11-12 ENCOUNTER — Ambulatory Visit: Payer: BC Managed Care – PPO | Admitting: Oncology

## 2019-11-12 NOTE — Telephone Encounter (Signed)
Scheduled appts per 7/6 los. Pt to get updated appt calendar at next appt per appt notes.

## 2019-11-17 NOTE — Telephone Encounter (Signed)
Error

## 2019-11-19 ENCOUNTER — Inpatient Hospital Stay: Payer: BC Managed Care – PPO

## 2019-11-19 ENCOUNTER — Other Ambulatory Visit: Payer: Self-pay | Admitting: Oncology

## 2019-11-19 ENCOUNTER — Other Ambulatory Visit: Payer: Self-pay

## 2019-11-19 ENCOUNTER — Inpatient Hospital Stay (HOSPITAL_BASED_OUTPATIENT_CLINIC_OR_DEPARTMENT_OTHER): Payer: BC Managed Care – PPO | Admitting: Medical

## 2019-11-19 ENCOUNTER — Telehealth: Payer: Self-pay | Admitting: *Deleted

## 2019-11-19 VITALS — BP 149/67 | HR 84 | Temp 98.1°F | Resp 18 | Ht 69.5 in | Wt 202.8 lb

## 2019-11-19 DIAGNOSIS — R300 Dysuria: Secondary | ICD-10-CM

## 2019-11-19 DIAGNOSIS — C50211 Malignant neoplasm of upper-inner quadrant of right female breast: Secondary | ICD-10-CM | POA: Diagnosis not present

## 2019-11-19 DIAGNOSIS — Z170 Estrogen receptor positive status [ER+]: Secondary | ICD-10-CM

## 2019-11-19 DIAGNOSIS — N3000 Acute cystitis without hematuria: Secondary | ICD-10-CM

## 2019-11-19 LAB — CBC WITH DIFFERENTIAL/PLATELET
Abs Immature Granulocytes: 0.01 10*3/uL (ref 0.00–0.07)
Basophils Absolute: 0 10*3/uL (ref 0.0–0.1)
Basophils Relative: 1 %
Eosinophils Absolute: 0 10*3/uL (ref 0.0–0.5)
Eosinophils Relative: 1 %
HCT: 23.5 % — ABNORMAL LOW (ref 36.0–46.0)
Hemoglobin: 7.9 g/dL — ABNORMAL LOW (ref 12.0–15.0)
Immature Granulocytes: 0 %
Lymphocytes Relative: 35 %
Lymphs Abs: 1 10*3/uL (ref 0.7–4.0)
MCH: 32.9 pg (ref 26.0–34.0)
MCHC: 33.6 g/dL (ref 30.0–36.0)
MCV: 97.9 fL (ref 80.0–100.0)
Monocytes Absolute: 0.3 10*3/uL (ref 0.1–1.0)
Monocytes Relative: 10 %
Neutro Abs: 1.5 10*3/uL — ABNORMAL LOW (ref 1.7–7.7)
Neutrophils Relative %: 53 %
Platelets: 118 10*3/uL — ABNORMAL LOW (ref 150–400)
RBC: 2.4 MIL/uL — ABNORMAL LOW (ref 3.87–5.11)
RDW: 14.8 % (ref 11.5–15.5)
WBC: 2.8 10*3/uL — ABNORMAL LOW (ref 4.0–10.5)
nRBC: 0 % (ref 0.0–0.2)

## 2019-11-19 LAB — COMPREHENSIVE METABOLIC PANEL
ALT: 31 U/L (ref 0–44)
AST: 27 U/L (ref 15–41)
Albumin: 3.7 g/dL (ref 3.5–5.0)
Alkaline Phosphatase: 85 U/L (ref 38–126)
Anion gap: 8 (ref 5–15)
BUN: 16 mg/dL (ref 6–20)
CO2: 23 mmol/L (ref 22–32)
Calcium: 9 mg/dL (ref 8.9–10.3)
Chloride: 110 mmol/L (ref 98–111)
Creatinine, Ser: 0.66 mg/dL (ref 0.44–1.00)
GFR calc Af Amer: >60 mL/min (ref >60)
GFR calc non Af Amer: >60 mL/min (ref >60)
Glucose, Bld: 96 mg/dL (ref 70–99)
Potassium: 3.5 mmol/L (ref 3.5–5.1)
Sodium: 141 mmol/L (ref 135–145)
Total Bilirubin: 0.3 mg/dL (ref 0.3–1.2)
Total Protein: 6.4 g/dL — ABNORMAL LOW (ref 6.5–8.1)

## 2019-11-19 LAB — URINALYSIS, COMPLETE (UACMP) WITH MICROSCOPIC
Bilirubin Urine: NEGATIVE
Glucose, UA: NEGATIVE mg/dL
Hgb urine dipstick: NEGATIVE
Ketones, ur: NEGATIVE mg/dL
Nitrite: NEGATIVE
Protein, ur: 30 mg/dL — AB
Specific Gravity, Urine: 1.029 (ref 1.005–1.030)
WBC, UA: >50 WBC/hpf — ABNORMAL HIGH (ref 0–5)
pH: 5 (ref 5.0–8.0)

## 2019-11-19 LAB — TYPE AND SCREEN
ABO/RH(D): AB POS
Antibody Screen: NEGATIVE

## 2019-11-19 LAB — SAMPLE TO BLOOD BANK

## 2019-11-19 MED ORDER — PALONOSETRON HCL INJECTION 0.25 MG/5ML
INTRAVENOUS | Status: AC
  Filled 2019-11-19: qty 5

## 2019-11-19 MED ORDER — DIPHENHYDRAMINE HCL 50 MG/ML IJ SOLN
INTRAMUSCULAR | Status: AC
  Filled 2019-11-19: qty 1

## 2019-11-19 MED ORDER — PHENAZOPYRIDINE HCL 200 MG PO TABS: 200.0000 mg | ORAL_TABLET | Freq: Three times a day (TID) | ORAL | 0 refills | Status: DC | PRN

## 2019-11-19 MED ORDER — PALONOSETRON HCL INJECTION 0.25 MG/5ML
0.2500 mg | Freq: Once | INTRAVENOUS | Status: AC
  Administered 2019-11-19: 0.25 mg via INTRAVENOUS

## 2019-11-19 MED ORDER — FAMOTIDINE IN NACL 20-0.9 MG/50ML-% IV SOLN
20.0000 mg | Freq: Once | INTRAVENOUS | Status: AC
  Administered 2019-11-19: 20 mg via INTRAVENOUS

## 2019-11-19 MED ORDER — SODIUM CHLORIDE 0.9 % IV SOLN
Freq: Once | INTRAVENOUS | Status: AC
  Filled 2019-11-19: qty 250

## 2019-11-19 MED ORDER — SODIUM CHLORIDE 0.9% FLUSH
10.0000 mL | INTRAVENOUS | Status: DC | PRN
  Administered 2019-11-19: 10 mL
  Filled 2019-11-19: qty 10

## 2019-11-19 MED ORDER — SODIUM CHLORIDE 0.9 % IV SOLN
270.0000 mg | Freq: Once | INTRAVENOUS | Status: AC
  Administered 2019-11-19: 270 mg via INTRAVENOUS
  Filled 2019-11-19: qty 27

## 2019-11-19 MED ORDER — FAMOTIDINE IN NACL 20-0.9 MG/50ML-% IV SOLN
INTRAVENOUS | Status: AC
  Filled 2019-11-19: qty 50

## 2019-11-19 MED ORDER — SODIUM CHLORIDE 0.9 % IV SOLN
80.0000 mg/m2 | Freq: Once | INTRAVENOUS | Status: AC
  Administered 2019-11-19: 168 mg via INTRAVENOUS
  Filled 2019-11-19: qty 28

## 2019-11-19 MED ORDER — DIPHENHYDRAMINE HCL 50 MG/ML IJ SOLN
25.0000 mg | Freq: Once | INTRAMUSCULAR | Status: AC
  Administered 2019-11-19: 25 mg via INTRAVENOUS

## 2019-11-19 MED ORDER — HEPARIN SOD (PORK) LOCK FLUSH 100 UNIT/ML IV SOLN
500.0000 [IU] | Freq: Once | INTRAVENOUS | Status: AC | PRN
  Administered 2019-11-19: 500 [IU]
  Filled 2019-11-19: qty 5

## 2019-11-19 MED ORDER — SODIUM CHLORIDE 0.9 % IV SOLN
10.0000 mg | Freq: Once | INTRAVENOUS | Status: AC
  Administered 2019-11-19: 10 mg via INTRAVENOUS
  Filled 2019-11-19: qty 10

## 2019-11-19 MED ORDER — CIPROFLOXACIN HCL 500 MG PO TABS: 500.0000 mg | ORAL_TABLET | Freq: Two times a day (BID) | ORAL | 0 refills | Status: DC

## 2019-11-19 NOTE — Progress Notes (Signed)
The patient was seen in the infusion room today as she was receiving treatment.  She was traveling last week reports development of dysuria on Friday.  She called the on-call service this past weekend by her report and was told that she needed to be seen.  A urinalysis was completed with results as noted in the patient's lab results portion of her chart.  She was given a prescription of Cipro 500 mg p.o. twice daily x5 days and was given a prescription for Pyridium.  Sandi Mealy, MHS, PA-C Physician Assistant

## 2019-11-19 NOTE — Patient Instructions (Signed)
[facility] Discharge Instructions for Patients Receiving Chemotherapy  Today you received the following chemotherapy agents: taxol, carboplatin   To help prevent nausea and vomiting after your treatment, we encourage you to take your nausea medication as directed.    If you develop nausea and vomiting that is not controlled by your nausea medication, call the clinic.   BELOW ARE SYMPTOMS THAT SHOULD BE REPORTED IMMEDIATELY:  *FEVER GREATER THAN 100.5 F  *CHILLS WITH OR WITHOUT FEVER  NAUSEA AND VOMITING THAT IS NOT CONTROLLED WITH YOUR NAUSEA MEDICATION  *UNUSUAL SHORTNESS OF BREATH  *UNUSUAL BRUISING OR BLEEDING  TENDERNESS IN MOUTH AND THROAT WITH OR WITHOUT PRESENCE OF ULCERS  *URINARY PROBLEMS  *BOWEL PROBLEMS  UNUSUAL RASH Items with * indicate a potential emergency and should be followed up as soon as possible.  Feel free to call the clinic should you have any questions or concerns. The clinic phone number is [phone].  Please show the [place] at check-in to the Emergency Department and triage nurse.   Anemia  Anemia is a condition in which you do not have enough red blood cells or hemoglobin. Hemoglobin is a substance in red blood cells that carries oxygen. When you do not have enough red blood cells or hemoglobin (are anemic), your body cannot get enough oxygen and your organs may not work properly. As a result, you may feel very tired or have other problems. What are the causes? Common causes of anemia include:  Excessive bleeding. Anemia can be caused by excessive bleeding inside or outside the body, including bleeding from the intestine or from periods in women.  Poor nutrition.  Long-lasting (chronic) kidney, thyroid, and liver disease.  Bone marrow disorders.  Cancer and treatments for cancer.  HIV (human immunodeficiency virus) and AIDS (acquired immunodeficiency syndrome).  Treatments for HIV and AIDS.  Spleen  problems.  Blood disorders.  Infections, medicines, and autoimmune disorders that destroy red blood cells. What are the signs or symptoms? Symptoms of this condition include:  Minor weakness.  Dizziness.  Headache.  Feeling heartbeats that are irregular or faster than normal (palpitations).  Shortness of breath, especially with exercise.  Paleness.  Cold sensitivity.  Indigestion.  Nausea.  Difficulty sleeping.  Difficulty concentrating. Symptoms may occur suddenly or develop slowly. If your anemia is mild, you may not have symptoms. How is this diagnosed? This condition is diagnosed based on:  Blood tests.  Your medical history.  A physical exam.  Bone marrow biopsy. Your health care provider may also check your stool (feces) for blood and may do additional testing to look for the cause of your bleeding. You may also have other tests, including:  Imaging tests, such as a CT scan or MRI.  Endoscopy.  Colonoscopy. How is this treated? Treatment for this condition depends on the cause. If you continue to lose a lot of blood, you may need to be treated at a hospital. Treatment may include:  Taking supplements of iron, vitamin X54, or folic acid.  Taking a hormone medicine (erythropoietin) that can help to stimulate red blood cell growth.  Having a blood transfusion. This may be needed if you lose a lot of blood.  Making changes to your diet.  Having surgery to remove your spleen. Follow these instructions at home:  Take over-the-counter and prescription medicines only as told by your health care provider.  Take supplements only as told by your health care provider.  Follow any diet instructions that you  were given.  Keep all follow-up visits as told by your health care provider. This is important. Contact a health care provider if:  You develop new bleeding anywhere in the body. Get help right away if:  You are very weak.  You are short of  breath.  You have pain in your abdomen or chest.  You are dizzy or feel faint.  You have trouble concentrating.  You have bloody or black, tarry stools.  You vomit repeatedly or you vomit up blood. Summary  Anemia is a condition in which you do not have enough red blood cells or enough of a substance in your red blood cells that carries oxygen (hemoglobin).  Symptoms may occur suddenly or develop slowly.  If your anemia is mild, you may not have symptoms.  This condition is diagnosed with blood tests as well as a medical history and physical exam. Other tests may be needed.  Treatment for this condition depends on the cause of the anemia. This information is not intended to replace advice given to you by your health care provider. Make sure you discuss any questions you have with your health care provider. Document Revised: 04/06/2017 Document Reviewed: 05/26/2016 Elsevier Patient Education  [place].

## 2019-11-19 NOTE — Telephone Encounter (Signed)
Sandi Mealy, PA will see in infusion room

## 2019-11-19 NOTE — Progress Notes (Unsigned)
Debra [name] is being treated today.  She did complain of some urinary symptoms.  We obtained a urinalysis which does show increased white cells and we are starting her on Cipro.  In addition she is significantly anemic and we are proceeding with transfusion tomorrow.  Today we discussed the risks and possible toxicities and complications of transfusions.  She is agreeable to proceed.

## 2019-11-19 NOTE — Progress Notes (Signed)
OK to treat with Hgb 7.9 per Dr Jana Hakim. Will need 1 unit of blood this week

## 2019-11-19 NOTE — Telephone Encounter (Signed)
Pt reports continued burning and pain with urination. Is having back pain. Coming in for infusion today. UA and culture ordered.

## 2019-11-20 ENCOUNTER — Inpatient Hospital Stay: Payer: BC Managed Care – PPO

## 2019-11-20 ENCOUNTER — Other Ambulatory Visit: Payer: Self-pay | Admitting: Oncology

## 2019-11-20 ENCOUNTER — Other Ambulatory Visit: Payer: Self-pay

## 2019-11-20 ENCOUNTER — Other Ambulatory Visit: Payer: Self-pay | Admitting: *Deleted

## 2019-11-20 VITALS — BP 142/84 | HR 72 | Temp 98.2°F | Resp 17

## 2019-11-20 DIAGNOSIS — Z170 Estrogen receptor positive status [ER+]: Secondary | ICD-10-CM

## 2019-11-20 DIAGNOSIS — C50211 Malignant neoplasm of upper-inner quadrant of right female breast: Secondary | ICD-10-CM

## 2019-11-20 DIAGNOSIS — Z95828 Presence of other vascular implants and grafts: Secondary | ICD-10-CM

## 2019-11-20 LAB — CBC WITH DIFFERENTIAL/PLATELET
Abs Immature Granulocytes: 0.01 10*3/uL (ref 0.00–0.07)
Basophils Absolute: 0 10*3/uL (ref 0.0–0.1)
Basophils Relative: 0 %
Eosinophils Absolute: 0 10*3/uL (ref 0.0–0.5)
Eosinophils Relative: 0 %
HCT: 23.7 % — ABNORMAL LOW (ref 36.0–46.0)
Hemoglobin: 7.9 g/dL — ABNORMAL LOW (ref 12.0–15.0)
Immature Granulocytes: 0 %
Lymphocytes Relative: 20 %
Lymphs Abs: 0.5 10*3/uL — ABNORMAL LOW (ref 0.7–4.0)
MCH: 32.4 pg (ref 26.0–34.0)
MCHC: 33.3 g/dL (ref 30.0–36.0)
MCV: 97.1 fL (ref 80.0–100.0)
Monocytes Absolute: 0.2 10*3/uL (ref 0.1–1.0)
Monocytes Relative: 8 %
Neutro Abs: 1.6 10*3/uL — ABNORMAL LOW (ref 1.7–7.7)
Neutrophils Relative %: 72 %
Platelets: 107 10*3/uL — ABNORMAL LOW (ref 150–400)
RBC: 2.44 MIL/uL — ABNORMAL LOW (ref 3.87–5.11)
RDW: 15 % (ref 11.5–15.5)
WBC: 2.3 10*3/uL — ABNORMAL LOW (ref 4.0–10.5)
nRBC: 0 % (ref 0.0–0.2)

## 2019-11-20 LAB — SAMPLE TO BLOOD BANK

## 2019-11-20 LAB — COMPREHENSIVE METABOLIC PANEL
ALT: 29 U/L (ref 0–44)
AST: 28 U/L (ref 15–41)
Albumin: 4.3 g/dL (ref 3.5–5.0)
Alkaline Phosphatase: 80 U/L (ref 38–126)
Anion gap: 8 (ref 5–15)
BUN: 17 mg/dL (ref 6–20)
CO2: 23 mmol/L (ref 22–32)
Calcium: 9.1 mg/dL (ref 8.9–10.3)
Chloride: 108 mmol/L (ref 98–111)
Creatinine, Ser: 0.57 mg/dL (ref 0.44–1.00)
GFR calc Af Amer: >60 mL/min (ref >60)
GFR calc non Af Amer: >60 mL/min (ref >60)
Glucose, Bld: 112 mg/dL — ABNORMAL HIGH (ref 70–99)
Potassium: 3.5 mmol/L (ref 3.5–5.1)
Sodium: 139 mmol/L (ref 135–145)
Total Bilirubin: 0.4 mg/dL (ref 0.3–1.2)
Total Protein: 6.4 g/dL — ABNORMAL LOW (ref 6.5–8.1)

## 2019-11-20 LAB — PREPARE RBC (CROSSMATCH)

## 2019-11-20 MED ORDER — DIPHENHYDRAMINE HCL 25 MG PO CAPS
ORAL_CAPSULE | ORAL | Status: AC
  Filled 2019-11-20: qty 2

## 2019-11-20 MED ORDER — SODIUM CHLORIDE 0.9% FLUSH
10.0000 mL | INTRAVENOUS | Status: AC | PRN
  Administered 2019-11-20: 10 mL
  Filled 2019-11-20: qty 10

## 2019-11-20 MED ORDER — FULVESTRANT 250 MG/5ML IM SOLN
INTRAMUSCULAR | Status: AC
  Filled 2019-11-20: qty 10

## 2019-11-20 MED ORDER — ACETAMINOPHEN 325 MG PO TABS
650.0000 mg | ORAL_TABLET | Freq: Once | ORAL | Status: AC
  Administered 2019-11-20: 650 mg via ORAL

## 2019-11-20 MED ORDER — SODIUM CHLORIDE 0.9% FLUSH
3.0000 mL | INTRAVENOUS | Status: DC | PRN
  Filled 2019-11-20: qty 10

## 2019-11-20 MED ORDER — SODIUM CHLORIDE 0.9% FLUSH
10.0000 mL | Freq: Once | INTRAVENOUS | Status: AC
  Administered 2019-11-20: 10 mL via INTRAVENOUS
  Filled 2019-11-20: qty 10

## 2019-11-20 MED ORDER — ACETAMINOPHEN 325 MG PO TABS
ORAL_TABLET | ORAL | Status: AC
  Filled 2019-11-20: qty 2

## 2019-11-20 MED ORDER — DIPHENHYDRAMINE HCL 25 MG PO CAPS
25.0000 mg | ORAL_CAPSULE | Freq: Once | ORAL | Status: AC
  Administered 2019-11-20: 25 mg via ORAL

## 2019-11-20 MED ORDER — SODIUM CHLORIDE 0.9% IV SOLUTION
250.0000 mL | Freq: Once | INTRAVENOUS | Status: AC
  Administered 2019-11-20: 250 mL via INTRAVENOUS
  Filled 2019-11-20: qty 250

## 2019-11-20 MED ORDER — HEPARIN SOD (PORK) LOCK FLUSH 100 UNIT/ML IV SOLN
500.0000 [IU] | Freq: Once | INTRAVENOUS | Status: DC
  Filled 2019-11-20: qty 5

## 2019-11-20 MED ORDER — HEPARIN SOD (PORK) LOCK FLUSH 100 UNIT/ML IV SOLN
500.0000 [IU] | Freq: Every day | INTRAVENOUS | Status: AC | PRN
  Administered 2019-11-20: 500 [IU]
  Filled 2019-11-20: qty 5

## 2019-11-20 NOTE — Patient Instructions (Signed)
https://www.[facility]blood.org/donate-blood/blood-donation-process/what-happens-to-donated-blood/blood-transfusions/types-of-blood-transfusions.html"> https://www.[facility].org/donate-blood/blood-donation-process/what-happens-to-donated-blood/blood-transfusions/risks-complications.html">  Blood Transfusion, Adult, Care After This sheet gives you information about how to care for yourself after your procedure. Your health care provider may also give you more specific instructions. If you have problems or questions, contact your health care provider. What can I expect after the procedure? After the procedure, it is common to have:  Bruising and soreness where the IV was inserted.  A fever or chills on the day of the procedure. This may be your body's response to the new blood cells received.  A headache. Follow these instructions at home: IV insertion site care      Follow instructions from your health care provider about how to take care of your IV insertion site. Make sure you: ? Wash your hands with soap and water before and after you change your bandage (dressing). If soap and water are not available, use hand sanitizer. ? Change your dressing as told by your health care provider.  Check your IV insertion site every day for signs of infection. Check for: ? Redness, swelling, or pain. ? Bleeding from the site. ? Warmth. ? Pus or a bad smell. General instructions  Take over-the-counter and prescription medicines only as told by your health care provider.  Rest as told by your health care provider.  Return to your normal activities as told by your health care provider.  Keep all follow-up visits as told by your health care provider. This is important. Contact a health care provider if:  You have itching or red, swollen areas of skin (hives).  You feel anxious.  You feel weak after doing your normal activities.  You have redness, swelling, warmth, or pain around the IV  insertion site.  You have blood coming from the IV insertion site that does not stop with pressure.  You have pus or a bad smell coming from your IV insertion site. Get help right away if:  You have symptoms of a serious allergic or immune system reaction, including: ? Trouble breathing or shortness of breath. ? Swelling of the face or feeling flushed. ? Fever or chills. ? Pain in the head, back, or chest. ? Dark urine or blood in the urine. ? Widespread rash. ? Fast heartbeat. ? Feeling dizzy or light-headed. If you receive your blood transfusion in an outpatient setting, you will be told whom to contact to report any reactions. These symptoms may represent a serious problem that is an emergency. Do not wait to see if the symptoms will go away. Get medical help right away. Call your local emergency services (911 in the [place] Do not drive yourself to the hospital. Summary  Bruising and tenderness around the IV insertion site are common.  Check your IV insertion site every day for signs of infection.  Rest as told by your health care provider. Return to your normal activities as told by your health care provider.  Get help right away for symptoms of a serious allergic or immune system reaction to blood transfusion. This information is not intended to replace advice given to you by your health care provider. Make sure you discuss any questions you have with your health care provider. Document Revised: 10/17/2018 Document Reviewed: 10/17/2018 Elsevier Patient Education  2020 Elsevier Inc.  

## 2019-11-20 NOTE — Telephone Encounter (Signed)
This LPN called pt d/t Rx refill request for Loratadine 10 mg from pharmacy. Based on last visit 10/29/19, medication was D/C per pt preference as she is no longer taking medication. Called pt to confirm she does not need refill. Pt verbalized that she did not request this refill from pharmacy and is no longer taking medication. Refill denied.

## 2019-11-20 NOTE — Patient Instructions (Signed)

## 2019-11-21 LAB — BPAM RBC
Blood Product Expiration Date: 202108072359
ISSUE DATE / TIME: 202107151121
Unit Type and Rh: 6200

## 2019-11-21 LAB — TYPE AND SCREEN
ABO/RH(D): AB POS
Antibody Screen: NEGATIVE
Unit division: 0

## 2019-11-21 LAB — URINE CULTURE: Culture: 40000 — AB

## 2019-11-24 ENCOUNTER — Telehealth: Payer: Self-pay | Admitting: *Deleted

## 2019-11-24 NOTE — Telephone Encounter (Signed)
This RN called for follow up on communication sent from [employer] per pt's call on 7/172021.  Obtained VM for pt- message left to return call to this RN.

## 2019-11-24 NOTE — Telephone Encounter (Signed)
This RN was able to speak to the patient post her VM forwarded from the operator stating she was returning this RN's call.  Debra [name] states she proceeded to an Urgent Care office ( note in system ) due to " just overall not feeling good- I felt pressure in my chest, and weak when I walked around and kind of shaky "  She states she was evaluated including an EKG - and was discharged home.  " I came home and slept a lot and now feel fine - maybe it was just everything catching up with me ".  She is completing the antibiotic she was given last week for UTI.  She is scheduled for follow up this Wednesday with MD.  No further needs at this time.

## 2019-11-26 ENCOUNTER — Inpatient Hospital Stay: Payer: BC Managed Care – PPO

## 2019-11-26 ENCOUNTER — Other Ambulatory Visit: Payer: Self-pay | Admitting: *Deleted

## 2019-11-26 ENCOUNTER — Inpatient Hospital Stay (HOSPITAL_BASED_OUTPATIENT_CLINIC_OR_DEPARTMENT_OTHER): Payer: BC Managed Care – PPO | Admitting: Oncology

## 2019-11-26 ENCOUNTER — Other Ambulatory Visit: Payer: Self-pay

## 2019-11-26 VITALS — BP 141/82 | HR 98 | Temp 98.5°F | Resp 20 | Ht 69.5 in | Wt 202.1 lb

## 2019-11-26 DIAGNOSIS — R309 Painful micturition, unspecified: Secondary | ICD-10-CM

## 2019-11-26 DIAGNOSIS — C50211 Malignant neoplasm of upper-inner quadrant of right female breast: Secondary | ICD-10-CM | POA: Diagnosis not present

## 2019-11-26 DIAGNOSIS — Z170 Estrogen receptor positive status [ER+]: Secondary | ICD-10-CM

## 2019-11-26 DIAGNOSIS — T451X5A Adverse effect of antineoplastic and immunosuppressive drugs, initial encounter: Secondary | ICD-10-CM

## 2019-11-26 LAB — CBC WITH DIFFERENTIAL/PLATELET
Abs Immature Granulocytes: 0 10*3/uL (ref 0.00–0.07)
Basophils Absolute: 0 10*3/uL (ref 0.0–0.1)
Basophils Relative: 1 %
Eosinophils Absolute: 0 10*3/uL (ref 0.0–0.5)
Eosinophils Relative: 2 %
HCT: 28.6 % — ABNORMAL LOW (ref 36.0–46.0)
Hemoglobin: 9.7 g/dL — ABNORMAL LOW (ref 12.0–15.0)
Immature Granulocytes: 0 %
Lymphocytes Relative: 42 %
Lymphs Abs: 1 10*3/uL (ref 0.7–4.0)
MCH: 32.6 pg (ref 26.0–34.0)
MCHC: 33.9 g/dL (ref 30.0–36.0)
MCV: 96 fL (ref 80.0–100.0)
Monocytes Absolute: 0.3 10*3/uL (ref 0.1–1.0)
Monocytes Relative: 11 %
Neutro Abs: 1 10*3/uL — ABNORMAL LOW (ref 1.7–7.7)
Neutrophils Relative %: 44 %
Platelets: 103 10*3/uL — ABNORMAL LOW (ref 150–400)
RBC: 2.98 MIL/uL — ABNORMAL LOW (ref 3.87–5.11)
RDW: 14.2 % (ref 11.5–15.5)
WBC: 2.3 10*3/uL — ABNORMAL LOW (ref 4.0–10.5)
nRBC: 0 % (ref 0.0–0.2)

## 2019-11-26 LAB — COMPREHENSIVE METABOLIC PANEL
ALT: 47 U/L — ABNORMAL HIGH (ref 0–44)
AST: 30 U/L (ref 15–41)
Albumin: 3.8 g/dL (ref 3.5–5.0)
Alkaline Phosphatase: 97 U/L (ref 38–126)
Anion gap: 8 (ref 5–15)
BUN: 18 mg/dL (ref 6–20)
CO2: 23 mmol/L (ref 22–32)
Calcium: 9.8 mg/dL (ref 8.9–10.3)
Chloride: 110 mmol/L (ref 98–111)
Creatinine, Ser: 0.63 mg/dL (ref 0.44–1.00)
GFR calc Af Amer: >60 mL/min (ref >60)
GFR calc non Af Amer: >60 mL/min (ref >60)
Glucose, Bld: 117 mg/dL — ABNORMAL HIGH (ref 70–99)
Potassium: 3.7 mmol/L (ref 3.5–5.1)
Sodium: 141 mmol/L (ref 135–145)
Total Bilirubin: 0.4 mg/dL (ref 0.3–1.2)
Total Protein: 6.7 g/dL (ref 6.5–8.1)

## 2019-11-26 LAB — URINALYSIS, COMPLETE (UACMP) WITH MICROSCOPIC
Bacteria, UA: NONE SEEN
Bilirubin Urine: NEGATIVE
Glucose, UA: NEGATIVE mg/dL
Hgb urine dipstick: NEGATIVE
Ketones, ur: NEGATIVE mg/dL
Nitrite: NEGATIVE
Protein, ur: NEGATIVE mg/dL
Specific Gravity, Urine: 1.03 (ref 1.005–1.030)
pH: 5 (ref 5.0–8.0)

## 2019-11-26 LAB — SAMPLE TO BLOOD BANK

## 2019-11-26 MED ORDER — PALONOSETRON HCL INJECTION 0.25 MG/5ML
INTRAVENOUS | Status: AC
  Filled 2019-11-26: qty 5

## 2019-11-26 MED ORDER — SODIUM CHLORIDE 0.9 % IV SOLN
Freq: Once | INTRAVENOUS | Status: AC
  Filled 2019-11-26: qty 250

## 2019-11-26 MED ORDER — SODIUM CHLORIDE 0.9% FLUSH
10.0000 mL | INTRAVENOUS | Status: DC | PRN
  Administered 2019-11-26: 10 mL
  Filled 2019-11-26: qty 10

## 2019-11-26 MED ORDER — PALONOSETRON HCL INJECTION 0.25 MG/5ML
0.2500 mg | Freq: Once | INTRAVENOUS | Status: AC
  Administered 2019-11-26: 0.25 mg via INTRAVENOUS

## 2019-11-26 MED ORDER — SODIUM CHLORIDE 0.9 % IV SOLN
10.0000 mg | Freq: Once | INTRAVENOUS | Status: AC
  Administered 2019-11-26: 10 mg via INTRAVENOUS
  Filled 2019-11-26: qty 10

## 2019-11-26 MED ORDER — SODIUM CHLORIDE 0.9 % IV SOLN
273.2000 mg | Freq: Once | INTRAVENOUS | Status: AC
  Administered 2019-11-26: 270 mg via INTRAVENOUS
  Filled 2019-11-26: qty 27

## 2019-11-26 MED ORDER — DIPHENHYDRAMINE HCL 50 MG/ML IJ SOLN
INTRAMUSCULAR | Status: AC
  Filled 2019-11-26: qty 1

## 2019-11-26 MED ORDER — SODIUM CHLORIDE 0.9 % IV SOLN
80.0000 mg/m2 | Freq: Once | INTRAVENOUS | Status: AC
  Administered 2019-11-26: 168 mg via INTRAVENOUS
  Filled 2019-11-26: qty 28

## 2019-11-26 MED ORDER — FAMOTIDINE IN NACL 20-0.9 MG/50ML-% IV SOLN
20.0000 mg | Freq: Once | INTRAVENOUS | Status: AC
  Administered 2019-11-26: 20 mg via INTRAVENOUS

## 2019-11-26 MED ORDER — HEPARIN SOD (PORK) LOCK FLUSH 100 UNIT/ML IV SOLN
500.0000 [IU] | Freq: Once | INTRAVENOUS | Status: AC | PRN
  Administered 2019-11-26: 500 [IU]
  Filled 2019-11-26: qty 5

## 2019-11-26 MED ORDER — FAMOTIDINE IN NACL 20-0.9 MG/50ML-% IV SOLN
INTRAVENOUS | Status: AC
  Filled 2019-11-26: qty 50

## 2019-11-26 MED ORDER — DIPHENHYDRAMINE HCL 50 MG/ML IJ SOLN
25.0000 mg | Freq: Once | INTRAMUSCULAR | Status: AC
  Administered 2019-11-26: 25 mg via INTRAVENOUS

## 2019-11-26 NOTE — Progress Notes (Addendum)
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   She started on weekly carboplatin and paclitaxel on 10/08/2019. Today is week 8.  She is doing quite well today.  She is having some mild lower abdominal cramping that starts two days prior to treatment.  She is passing gas and having regular bowel movements.  This cramping occurs every time she eats.  When she came in for treatment last week, she complained of some urinary symptoms. We obtained a urinalysis which does show increased white cells and we are starting her on Cipro.  In addition, she was significantly anemic and received transfusion the following day.   REVIEW OF SYSTEMS: Debra [name] tells me that her lower abdominal pain and diarrhea resolved when she started Cipro, which is suggestive of diverticular disease.  She still has a feeling of "a jolt" when she starts to urinate, and then burning throughout the urination.  She has not had fever or hematuria.  She does not have frequency.  She is drinking plenty of fluids.  She felt bad over the weekend, was directed to urgent care and they obtained a blood sugar of 45 on Saturday which they called her about on Tuesday to tell her to seek urgent care.  She questions  whether this may be a lab error and I think she is correct.  EKG obtained at the same time showed normal sinus   HISTORY OF CURRENT ILLNESS: From the original intake note:  Debra [name] herself palpated a mass in the upper-inner right breast in February 2021.  It appeared to increase in size over the next 3 weeks. Physical exam performed at [facility] 07/15/2019 confirmed a 6 mm firm, oval, palpable mass in the upper-inner right breast. She underwent bilateral diagnostic mammography with tomography and bilateral breast ultrasonography on 07/15/2019 showing: breast density category C; 4.6 cm mass in the right breast at 1 o'clock; single abnormal-appearing lymph node; bilateral benign cysts.  Accordingly on 07/21/2019 she proceeded to biopsy of the right breast area in question. 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 Prognostic indicators significant for: estrogen receptor, 60% positive with weak staining intensity and progesterone receptor, 0% negative. Proliferation marker Ki67 at 90%. HER2 equivocal by immunohistochemistry (2+), but negative by fluorescent in situ hybridization with a signals ratio 1.24 and number per cell 2.55.  The questionable right axillary lymph node was biopsied as well and was benign (concordant).  The patient's subsequent history is as detailed below.   PAST MEDICAL HISTORY: Past Medical History:  Diagnosis Date  . Family history of breast cancer   . Family history of colon cancer   . Family history of nonmelanoma skin cancer     PAST SURGICAL HISTORY: Past Surgical History:  Procedure Laterality Date  . ABDOMINAL HYSTERECTOMY    . LUNG BIOPSY    .  PORTACATH PLACEMENT Left 08/06/2019   Procedure: INSERTION PORT-A-CATH WITH ULTRASOUND GUIDANCE;  Surgeon: [name], MD;  Location: [facility];  Service: General;  Laterality: Left;  Status post  bilateral salpingo-oophorectomy   FAMILY HISTORY: Family History  Problem Relation Age of Onset  . Skin cancer Sister        non-melanoma dx. in her 68s  . Other Sister        normal genetic testing for hereditary cancer risks  . Colon cancer Paternal Grandfather        dx. in his 53s  . Wilm's tumor Son        dx. 9 months  . Breast cancer Other        dx. in her 37s (PGM's sister)  . Wilm's tumor Nephew 2  . Breast cancer Other        dx. in her 75s (PGM's niece)    Her father is 38 years old as of 07/2019. Her mother was murdered at age 56 (domestic violence).  The patient has 3 sisters and no brothers. She reports a third cousin (grandmother's sister's daughter) with breast cancer in her 56's. She denies a family history of ovarian, prostate, or pancreatic cancer. She does report colon cancer in her paternal grandfather in his 12's, non-melanoma skin cancer in her sister in her 88's, and Wilms tumor in her son at 74 months.   GYNECOLOGIC HISTORY:  Patient's last menstrual period was 05/09/2011. Menarche: 29-44 years old Age at first live birth: 59 years old [place] P 3 LMP 2013 Contraceptive never used HRT used for approximately 9 months  Hysterectomy? Yes, 2013 BSO? 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 Husband [name] is an Art gallery manager. She lives at home with husband Debra [name] Daughter Debra [name], age 54, is a poet and Pharmacist, hospital in [place], [place]. Son Debra [name], age 51, is an Production designer, theatre/television/film in [place] in [place], [place]. Son Debra [name], age 56, is a Marketing executive working in Artist in [place], [place] (at the [employer]). Debra [name] has three grandchildren. She is a Media planner.    ADVANCED DIRECTIVES: In the absence of any documentation to the contrary, the patient's spouse is their HCPOA.    HEALTH MAINTENANCE: Social History   Tobacco Use  . Smoking status:  Former Research scientist (life sciences)  . Smokeless tobacco: Never Used  Substance Use Topics  . Alcohol use: Yes  . Drug use: Never     Colonoscopy: none on file  PAP: none on file (s/p hysterectomy)  Bone density: n/a (age)   Allergies  Allergen Reactions  . Other Rash    Walnuts give her a rash Walnuts give her a rash Walnuts give her a rash Walnuts give her a rash     Current Outpatient Medications  Medication Sig Dispense Refill  . ciprofloxacin (CIPRO) 500 MG tablet Take 1 tablet (500 mg total) by mouth 2 (two) times daily. 10 tablet 0  . lidocaine-prilocaine (EMLA) cream Apply to affected area once 30 g 3  . LORazepam (ATIVAN) 0.5 MG tablet Take 1 tablet (0.5 mg total) by mouth at bedtime as needed (Nausea or vomiting). 30 tablet 0  . Multiple Vitamin (MULTI-VITAMIN DAILY PO) Take by mouth.    . phenazopyridine (PYRIDIUM) 200 MG tablet Take 1 tablet (200 mg total) by mouth 3 (three) times daily as needed for pain. 12 tablet 0  . prochlorperazine (COMPAZINE) 10 MG tablet Take  1 tablet (10 mg total) by mouth every 6 (six) hours as needed (Nausea or vomiting). 30 tablet 1  . traMADol (ULTRAM) 50 MG tablet Take 1 tablet (50 mg total) by mouth every 6 (six) hours as needed for moderate pain or severe pain. 20 tablet 0  . UNABLE TO FIND Omega Krill Oil     No current facility-administered medications for this visit.   Facility-Administered Medications Ordered in Other Visits  Medication Dose Route Frequency Provider Last Rate Last Admin  . sodium chloride flush (NS) 0.9 % injection 10 mL  10 mL Intracatheter PRN Shakea Isip, [name], MD   10 mL at 11/11/19 1710    OBJECTIVE: White woman who appears stated age  88:   11/26/19 1321  BP: (!) 141/82  Pulse: 98  Resp: 20  Temp: 98.5 F (36.9 C)  SpO2: 100%     Body mass index is 29.42 kg/m.   Wt Readings from Last 3 Encounters:  11/26/19 202 lb 1.6 oz (91.7 kg)  11/19/19 202 lb 12 oz (92 kg)  11/11/19 200 lb 4.8 oz (90.9 kg)     ECOG  FS:1 - Symptomatic but completely ambulatory  Sclerae unicteric, EOMs intact Wearing a mask No cervical or supraclavicular adenopathy Lungs no rales or rhonchi Heart regular rate and rhythm Abd soft, nontender, positive bowel sounds MSK no focal spinal tenderness, no upper extremity lymphedema Neuro: nonfocal, well oriented, appropriate affect Breasts: There are no palpable masses in the right breast.  There are no skin or nipple changes of concern.  The left breast and both axillae are benign.   LAB RESULTS:  CMP     Component Value Date/Time   NA 139 11/20/2019 0835   K 3.5 11/20/2019 0835   CL 108 11/20/2019 0835   CO2 23 11/20/2019 0835   GLUCOSE 112 (H) 11/20/2019 0835   BUN 17 11/20/2019 0835   CREATININE 0.57 11/20/2019 0835   CREATININE 0.76 07/30/2019 0825   CALCIUM 9.1 11/20/2019 0835   PROT 6.4 (L) 11/20/2019 0835   ALBUMIN 4.3 11/20/2019 0835   AST 28 11/20/2019 0835   AST 14 (L) 07/30/2019 0825   ALT 29 11/20/2019 0835   ALT 11 07/30/2019 0825   ALKPHOS 80 11/20/2019 0835   BILITOT 0.4 11/20/2019 0835   BILITOT 0.4 07/30/2019 0825   GFRNONAA >60 11/20/2019 0835   GFRNONAA >60 07/30/2019 0825   GFRAA >60 11/20/2019 0835   GFRAA >60 07/30/2019 0825    No results found for: TOTALPROTELP, ALBUMINELP, A1GS, A2GS, BETS, BETA2SER, GAMS, MSPIKE, SPEI  Lab Results  Component Value Date   WBC 2.3 (L) 11/26/2019   NEUTROABS 1.0 (L) 11/26/2019   HGB 9.7 (L) 11/26/2019   HCT 28.6 (L) 11/26/2019   MCV 96.0 11/26/2019   PLT 103 (L) 11/26/2019    No results found for: LABCA2  No components found for: ZJQBHA193  No results for input(s): INR in the last 168 hours.  No results found for: LABCA2  No results found for: XTK240  No results found for: XBD532  No results found for: DJM426  No results found for: CA2729  No components found for: HGQUANT  No results found for: CEA1 / No results found for: CEA1   No results found for: AFPTUMOR  No results  found for: CHROMOGRNA  No results found for: KPAFRELGTCHN, LAMBDASER, KAPLAMBRATIO (kappa/lambda light chains)  No results found for: HGBA, HGBA2QUANT, HGBFQUANT, HGBSQUAN (Hemoglobinopathy evaluation)   No results found for: LDH  No results  found for: IRON, TIBC, IRONPCTSAT (Iron and TIBC)  No results found for: FERRITIN  Urinalysis    Component Value Date/Time   COLORURINE YELLOW 11/19/2019 1240   APPEARANCEUR HAZY (A) 11/19/2019 1240   LABSPEC 1.029 11/19/2019 1240   PHURINE 5.0 11/19/2019 1240   GLUCOSEU NEGATIVE 11/19/2019 1240   HGBUR NEGATIVE 11/19/2019 1240   BILIRUBINUR NEGATIVE 11/19/2019 1240   KETONESUR NEGATIVE 11/19/2019 1240   PROTEINUR 30 (A) 11/19/2019 1240   NITRITE NEGATIVE 11/19/2019 1240   LEUKOCYTESUR MODERATE (A) 11/19/2019 1240    STUDIES: No results found.   ELIGIBLE FOR AVAILABLE RESEARCH PROTOCOL: AET  ASSESSMENT: 59 y.o. 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  (2) definitive surgery to follow  (3) adjuvant radiation to follow-up surgery as appropriate  (4) genetics testing 11/06/2019 through the [facility] Multi-Cancer Panel + Wilms Tumor Panel found no deleterious mutations in AIP, ALK, APC, ATM, AXIN2,BAP1,  BARD1, BLM, BMPR1A, BRCA1, BRCA2, BRIP1, CASR, CDC73, CDH1, CDK4, CDKN1B, CDKN1C, CDKN2A (p14ARF), CDKN2A (p16INK4a), CEBPA, CHEK2, CTNNA1, DICER1, DIS3L2, EGFR (c.2369C>T, p.Thr790Met variant only), EPCAM (Deletion/duplication testing only), FH, FLCN, GATA2, GPC3, GREM1  (Promoter region deletion/duplication testing only), HOXB13 (c.251G>A, p.Gly84Glu), HRAS, KIT, MAX, MEN1, MET, MITF (c.952G>A, p.Glu318Lys variant only), MLH1, MSH2, MSH3, MSH6, MUTYH, NBN, NF1, NF2, NTHL1, PALB2, PDGFRA, PHOX2B, PMS2, POLD1, POLE, POT1, PRKAR1A, PTCH1, PTEN, RAD50, RAD51C, RAD51D, RB1, RECQL4, RET, RNF43, RUNX1, SDHAF2, SDHA (sequence changes only), SDHB, SDHC, SDHD, SMAD4, SMARCA4, SMARCB1, SMARCE1, STK11, SUFU, TERC, TERT, TMEM127, TP53, TSC1, TSC2, VHL, WRN and WT1. The Wilms Tumor Panel offered by [facility] includes sequencing and deletion/duplication testing of the following 7 genes: CDC73, CDKN1C, CTR9, DIS3L2, GPC3, REST, and WT1.  (a) Three variants of uncertain significance were detected - one in the AXIN2 gene called c.13A>G, one in the RNF43 gene called c.1770G>C, and one in the [facility] gene called c.85C>G.    PLAN: Janda very likely had diverticular disease that improved with the Cipro.  This is not something that she has had documented before and we may want to do some studies at some point to document.  At this point she may have cystitis instead of an infection in the bladder.  She does have some iridium I believe available and she may use that.  We are waiting on the urinalysis and urine culture today.  Certainly we can give her more antibiotics if that would be helpful based on those tests.  We have absolutely no prior readings on the glucose below 89 I suspect the 45 reading that she had in the outside clinic may have been.  If she develops symptoms which she thinks may be related she will come and we will immediately check a glucose for her.  We discussed not giving her treatment today given the ANC of 1.0.  We are going to do instead is proceed with treatment but also give her Granix or similar on Friday Saturday and Monday after this and every remaining Taxol treatment.  The orders have been placed.  I believe this has been preapproved.  She does need to see [place] with  the last few remaining treatments  and I have added those orders  She knows to call for any other issue that may develop before the next visit  Total encounter time 35 minutes.Debra [name] C. Deyci Gesell, MD 11/26/19 1:35 PM Medical Oncology and Hematology [facility] [place], [place] [address] Tel. [phone]    Fax. [phone]   I, Wilburn Mylar, am acting as scribe for Dr. [name]. Debra [name]  I, Lurline Del MD, have reviewed the above documentation for accuracy and completeness, and I agree with the above.   *Total Encounter Time as defined by the Centers for Medicare and Medicaid Services includes, in addition to the face-to-face time of a patient visit (documented in the note above) non-face-to-face time: obtaining and reviewing outside history, ordering and reviewing medications, tests or procedures, care coordination (communications with other health care professionals or caregivers) and documentation in the medical record.

## 2019-11-26 NOTE — Patient Instructions (Signed)
[facility] Discharge Instructions for Patients Receiving Chemotherapy  Today you received the following chemotherapy agents: paclitaxel and carboplatin.  To help prevent nausea and vomiting after your treatment, we encourage you to take your nausea medication as directed.   If you develop nausea and vomiting that is not controlled by your nausea medication, call the clinic.   BELOW ARE SYMPTOMS THAT SHOULD BE REPORTED IMMEDIATELY:  *FEVER GREATER THAN 100.5 F  *CHILLS WITH OR WITHOUT FEVER  NAUSEA AND VOMITING THAT IS NOT CONTROLLED WITH YOUR NAUSEA MEDICATION  *UNUSUAL SHORTNESS OF BREATH  *UNUSUAL BRUISING OR BLEEDING  TENDERNESS IN MOUTH AND THROAT WITH OR WITHOUT PRESENCE OF ULCERS  *URINARY PROBLEMS  *BOWEL PROBLEMS  UNUSUAL RASH Items with * indicate a potential emergency and should be followed up as soon as possible.  Feel free to call the clinic should you have any questions or concerns. The clinic phone number is [phone].  Please show the CHEMO ALERT CARD at check-in to the Emergency Department and triage nurse.   

## 2019-11-26 NOTE — Patient Instructions (Signed)

## 2019-11-27 ENCOUNTER — Other Ambulatory Visit: Payer: Self-pay | Admitting: Hematology & Oncology

## 2019-11-27 ENCOUNTER — Encounter: Payer: Self-pay | Admitting: Oncology

## 2019-11-27 LAB — URINE CULTURE: Culture: NO GROWTH

## 2019-11-28 ENCOUNTER — Inpatient Hospital Stay: Payer: BC Managed Care – PPO

## 2019-11-28 ENCOUNTER — Other Ambulatory Visit: Payer: Self-pay

## 2019-11-28 ENCOUNTER — Other Ambulatory Visit: Payer: Self-pay | Admitting: Oncology

## 2019-11-28 VITALS — BP 132/74 | HR 72 | Resp 18

## 2019-11-28 DIAGNOSIS — C50211 Malignant neoplasm of upper-inner quadrant of right female breast: Secondary | ICD-10-CM

## 2019-11-28 DIAGNOSIS — Z170 Estrogen receptor positive status [ER+]: Secondary | ICD-10-CM

## 2019-11-28 MED ORDER — FILGRASTIM-AAFI 480 MCG/0.8ML IJ SOSY
480.0000 ug | PREFILLED_SYRINGE | Freq: Once | INTRAMUSCULAR | Status: AC
  Administered 2019-11-28: 480 ug via SUBCUTANEOUS
  Filled 2019-11-28: qty 0.8

## 2019-11-28 NOTE — Progress Notes (Signed)
PA pending.  OK'd by PA team to give Nivestym [facility] as meets criteria for tx.  Kennith Center, Pharm.D., CPP 11/28/2019@2 :23 PM

## 2019-11-28 NOTE — Progress Notes (Signed)
Per pharmacy, Val RN, and Dr Nat Math ok to proceed with nivestym injection today

## 2019-11-28 NOTE — Progress Notes (Signed)
Per Gaspar Bidding, BCBS auth'd [place].  Kennith Center, Pharm.D., CPP 11/28/2019@2 :31 PM

## 2019-11-28 NOTE — Patient Instructions (Signed)

## 2019-11-29 ENCOUNTER — Inpatient Hospital Stay: Payer: BC Managed Care – PPO

## 2019-11-29 ENCOUNTER — Other Ambulatory Visit: Payer: Self-pay

## 2019-11-29 VITALS — BP 125/61 | HR 97 | Temp 98.7°F | Resp 17 | Ht 69.5 in

## 2019-11-29 DIAGNOSIS — C50211 Malignant neoplasm of upper-inner quadrant of right female breast: Secondary | ICD-10-CM | POA: Diagnosis not present

## 2019-11-29 MED ORDER — FILGRASTIM-AAFI 480 MCG/0.8ML IJ SOSY
480.0000 ug | PREFILLED_SYRINGE | Freq: Once | INTRAMUSCULAR | Status: AC
  Administered 2019-11-29: 480 ug via SUBCUTANEOUS

## 2019-11-29 NOTE — Patient Instructions (Signed)

## 2019-12-01 ENCOUNTER — Inpatient Hospital Stay: Payer: BC Managed Care – PPO

## 2019-12-01 ENCOUNTER — Other Ambulatory Visit: Payer: Self-pay

## 2019-12-01 VITALS — BP 128/62 | HR 78 | Temp 98.0°F | Resp 18

## 2019-12-01 DIAGNOSIS — C50211 Malignant neoplasm of upper-inner quadrant of right female breast: Secondary | ICD-10-CM

## 2019-12-01 DIAGNOSIS — Z170 Estrogen receptor positive status [ER+]: Secondary | ICD-10-CM

## 2019-12-01 MED ORDER — FILGRASTIM-AAFI 480 MCG/0.8ML IJ SOSY
480.0000 ug | PREFILLED_SYRINGE | Freq: Once | INTRAMUSCULAR | Status: AC
  Administered 2019-12-01: 480 ug via SUBCUTANEOUS
  Filled 2019-12-01: qty 0.8

## 2019-12-01 NOTE — Patient Instructions (Signed)

## 2019-12-03 ENCOUNTER — Inpatient Hospital Stay (HOSPITAL_BASED_OUTPATIENT_CLINIC_OR_DEPARTMENT_OTHER): Payer: BC Managed Care – PPO | Admitting: Oncology

## 2019-12-03 ENCOUNTER — Inpatient Hospital Stay: Payer: BC Managed Care – PPO

## 2019-12-03 ENCOUNTER — Other Ambulatory Visit: Payer: Self-pay

## 2019-12-03 VITALS — BP 127/73 | HR 89 | Temp 98.0°F | Resp 17 | Ht 69.5 in | Wt 200.1 lb

## 2019-12-03 DIAGNOSIS — Z170 Estrogen receptor positive status [ER+]: Secondary | ICD-10-CM

## 2019-12-03 DIAGNOSIS — C50211 Malignant neoplasm of upper-inner quadrant of right female breast: Secondary | ICD-10-CM

## 2019-12-03 LAB — CBC WITH DIFFERENTIAL/PLATELET
Abs Immature Granulocytes: 0.06 10*3/uL (ref 0.00–0.07)
Basophils Absolute: 0 10*3/uL (ref 0.0–0.1)
Basophils Relative: 1 %
Eosinophils Absolute: 0.1 10*3/uL (ref 0.0–0.5)
Eosinophils Relative: 1 %
HCT: 26.6 % — ABNORMAL LOW (ref 36.0–46.0)
Hemoglobin: 9.1 g/dL — ABNORMAL LOW (ref 12.0–15.0)
Immature Granulocytes: 1 %
Lymphocytes Relative: 33 %
Lymphs Abs: 1.4 10*3/uL (ref 0.7–4.0)
MCH: 33.1 pg (ref 26.0–34.0)
MCHC: 34.2 g/dL (ref 30.0–36.0)
MCV: 96.7 fL (ref 80.0–100.0)
Monocytes Absolute: 0.8 10*3/uL (ref 0.1–1.0)
Monocytes Relative: 18 %
Neutro Abs: 2 10*3/uL (ref 1.7–7.7)
Neutrophils Relative %: 46 %
Platelets: 108 10*3/uL — ABNORMAL LOW (ref 150–400)
RBC: 2.75 MIL/uL — ABNORMAL LOW (ref 3.87–5.11)
RDW: 14.6 % (ref 11.5–15.5)
WBC: 4.4 10*3/uL (ref 4.0–10.5)
nRBC: 0 % (ref 0.0–0.2)

## 2019-12-03 LAB — COMPREHENSIVE METABOLIC PANEL
ALT: 30 U/L (ref 0–44)
AST: 21 U/L (ref 15–41)
Albumin: 3.9 g/dL (ref 3.5–5.0)
Alkaline Phosphatase: 104 U/L (ref 38–126)
Anion gap: 10 (ref 5–15)
BUN: 17 mg/dL (ref 6–20)
CO2: 21 mmol/L — ABNORMAL LOW (ref 22–32)
Calcium: 9.7 mg/dL (ref 8.9–10.3)
Chloride: 106 mmol/L (ref 98–111)
Creatinine, Ser: 0.65 mg/dL (ref 0.44–1.00)
GFR calc Af Amer: >60 mL/min (ref >60)
GFR calc non Af Amer: >60 mL/min (ref >60)
Glucose, Bld: 97 mg/dL (ref 70–99)
Potassium: 3.9 mmol/L (ref 3.5–5.1)
Sodium: 137 mmol/L (ref 135–145)
Total Bilirubin: 0.4 mg/dL (ref 0.3–1.2)
Total Protein: 6.6 g/dL (ref 6.5–8.1)

## 2019-12-03 MED ORDER — SODIUM CHLORIDE 0.9% FLUSH
10.0000 mL | INTRAVENOUS | Status: DC | PRN
  Administered 2019-12-03: 10 mL
  Filled 2019-12-03: qty 10

## 2019-12-03 MED ORDER — SODIUM CHLORIDE 0.9 % IV SOLN
80.0000 mg/m2 | Freq: Once | INTRAVENOUS | Status: AC
  Administered 2019-12-03: 168 mg via INTRAVENOUS
  Filled 2019-12-03: qty 28

## 2019-12-03 MED ORDER — DIPHENHYDRAMINE HCL 50 MG/ML IJ SOLN
INTRAMUSCULAR | Status: AC
  Filled 2019-12-03: qty 1

## 2019-12-03 MED ORDER — SODIUM CHLORIDE 0.9 % IV SOLN
10.0000 mg | Freq: Once | INTRAVENOUS | Status: AC
  Administered 2019-12-03: 10 mg via INTRAVENOUS
  Filled 2019-12-03: qty 10

## 2019-12-03 MED ORDER — HEPARIN SOD (PORK) LOCK FLUSH 100 UNIT/ML IV SOLN
500.0000 [IU] | Freq: Once | INTRAVENOUS | Status: AC | PRN
  Administered 2019-12-03: 500 [IU]
  Filled 2019-12-03: qty 5

## 2019-12-03 MED ORDER — FAMOTIDINE IN NACL 20-0.9 MG/50ML-% IV SOLN
INTRAVENOUS | Status: AC
  Filled 2019-12-03: qty 50

## 2019-12-03 MED ORDER — FAMOTIDINE IN NACL 20-0.9 MG/50ML-% IV SOLN
20.0000 mg | Freq: Once | INTRAVENOUS | Status: AC
  Administered 2019-12-03: 20 mg via INTRAVENOUS

## 2019-12-03 MED ORDER — DIPHENHYDRAMINE HCL 50 MG/ML IJ SOLN
25.0000 mg | Freq: Once | INTRAMUSCULAR | Status: AC
  Administered 2019-12-03: 25 mg via INTRAVENOUS

## 2019-12-03 MED ORDER — PALONOSETRON HCL INJECTION 0.25 MG/5ML
0.2500 mg | Freq: Once | INTRAVENOUS | Status: AC
  Administered 2019-12-03: 0.25 mg via INTRAVENOUS

## 2019-12-03 MED ORDER — PALONOSETRON HCL INJECTION 0.25 MG/5ML
INTRAVENOUS | Status: AC
  Filled 2019-12-03: qty 5

## 2019-12-03 MED ORDER — SODIUM CHLORIDE 0.9 % IV SOLN
Freq: Once | INTRAVENOUS | Status: AC
  Filled 2019-12-03: qty 250

## 2019-12-03 MED ORDER — SODIUM CHLORIDE 0.9 % IV SOLN
273.2000 mg | Freq: Once | INTRAVENOUS | Status: AC
  Administered 2019-12-03: 270 mg via INTRAVENOUS
  Filled 2019-12-03: qty 27

## 2019-12-03 NOTE — Progress Notes (Signed)
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   She started on weekly carboplatin and paclitaxel on 10/08/2019. Today is week 9.  Because of the low white cell counts she is receiving growth factor  days to 3 and 4 of each remaining cycles.  She tolerated the fill gastrium with minimal bony aches.  Generally feels she felt very well, that her usual activities, and is getting ready to start the school year  REVIEW OF SYSTEMS: Debra [name] still has absolutely no peripheral neuropathy symptoms.  She did have a little bit more nausea and took Compazine once daily for 2 days after the last chemo treatment.  She jammed her right foot under the sofa and had a bruise there and she worried that that might become a nidus for clot.  She wanted me to look at that.  She also felt felt the skin over her port was getting a little bit thin.  Aside from these issues a detailed review of systems today was stable   HISTORY OF CURRENT ILLNESS: From the original intake note:  Debra [name] herself palpated a mass in the upper-inner right breast in February 2021.  It appeared to increase in size over the next 3 weeks. Physical exam performed at [facility] 07/15/2019 confirmed a 6 mm firm,  oval, palpable mass in the upper-inner right breast. She underwent bilateral diagnostic mammography with tomography and bilateral breast ultrasonography on 07/15/2019 showing: breast density category C; 4.6 cm mass in the right breast at 1 o'clock; single abnormal-appearing lymph node; bilateral benign cysts.  Accordingly on 07/21/2019 she proceeded to biopsy of the right breast area in question. 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 Prognostic indicators significant for: estrogen receptor, 60% positive with weak staining intensity and progesterone receptor, 0% negative. Proliferation marker Ki67 at 90%. HER2 equivocal by immunohistochemistry (2+), but negative by fluorescent in situ hybridization with a signals ratio 1.24 and number per cell 2.55.  The questionable right axillary lymph node was biopsied as well and was benign (concordant).  The patient's subsequent history is as detailed below.   PAST MEDICAL HISTORY: Past Medical History:  Diagnosis Date  . Family history of breast cancer   . Family history of colon cancer   . Family history of nonmelanoma skin cancer     PAST SURGICAL HISTORY: Past Surgical History:  Procedure Laterality Date  . ABDOMINAL HYSTERECTOMY    . LUNG BIOPSY    . PORTACATH PLACEMENT Left 08/06/2019   Procedure: INSERTION PORT-A-CATH WITH ULTRASOUND GUIDANCE;  Surgeon: [name], MD;  Location: [facility];  Service: General;  Laterality: Left;  Status post bilateral salpingo-oophorectomy   FAMILY HISTORY: Family History  Problem Relation Age of Onset  . Skin cancer Sister        non-melanoma dx. in her 10s  .  Other Sister        normal genetic testing for hereditary cancer risks  . Colon cancer Paternal Grandfather        dx. in his 25s  . Wilm's tumor Son        dx. 9 months  . Breast cancer Other        dx. in her 32s (PGM's  sister)  . Wilm's tumor Nephew 2  . Breast cancer Other        dx. in her 49s (PGM's niece)    Her father is 76 years old as of 07/2019. Her mother was murdered at age 22 (domestic violence).  The patient has 3 sisters and no brothers. She reports a third cousin (grandmother's sister's daughter) with breast cancer in her 55's. She denies a family history of ovarian, prostate, or pancreatic cancer. She does report colon cancer in her paternal grandfather in his 5's, non-melanoma skin cancer in her sister in her 89's, and Wilms tumor in her son at 59 months.   GYNECOLOGIC HISTORY:  Patient's last menstrual period was 05/09/2011. Menarche: 57-53 years old Age at first live birth: 59 years old Debra [name] 3 LMP 2013 Contraceptive never used HRT used for approximately 9 months  Hysterectomy? Yes, 2013 BSO? 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 Husband Debra [name] is an Art gallery manager. She lives at home with husband Debra [name] Daughter Debra [name], age 100, is a poet and Pharmacist, hospital in [place], [place]. Son Debra [name], age 30, is an Production designer, theatre/television/film in [place] in [place], [place]. Son Debra [name], age 51, is a Marketing executive working in Artist in [place], [place] (at the [employer]). Debra [name] has three grandchildren. She is a Media planner.    ADVANCED DIRECTIVES: In the absence of any documentation to the contrary, the patient's spouse is their HCPOA.    HEALTH MAINTENANCE: Social History   Tobacco Use  . Smoking status: Former Research scientist (life sciences)  . Smokeless tobacco: Never Used  Substance Use Topics  . Alcohol use: Yes  . Drug use: Never     Colonoscopy: none on file  PAP: none on file (s/[name] hysterectomy)  Bone density: n/a (age)   Allergies  Allergen Reactions  . Other Rash    Walnuts give her a rash Walnuts give her a rash Walnuts give her a rash Walnuts give her a rash     Current Outpatient  Medications  Medication Sig Dispense Refill  . ciprofloxacin (CIPRO) 500 MG tablet Take 1 tablet (500 mg total) by mouth 2 (two) times daily. 10 tablet 0  . lidocaine-prilocaine (EMLA) cream Apply to affected area once 30 g 3  . LORazepam (ATIVAN) 0.5 MG tablet Take 1 tablet (0.5 mg total) by mouth at bedtime as needed (Nausea or vomiting). 30 tablet 0  . Multiple Vitamin (MULTI-VITAMIN DAILY PO) Take by mouth.    . phenazopyridine (PYRIDIUM) 200 MG tablet Take 1 tablet (200 mg total) by mouth 3 (three) times daily as needed for pain. 12 tablet 0  . prochlorperazine (COMPAZINE) 10 MG tablet Take 1 tablet (10 mg total) by mouth every 6 (six) hours as needed (Nausea or vomiting). 30 tablet 1  . traMADol (ULTRAM) 50 MG tablet Take 1 tablet (50 mg total) by mouth every 6 (six) hours as needed for moderate pain or severe pain. 20 tablet 0  . UNABLE TO FIND Omega Krill Oil     No current facility-administered medications  for this visit.   Facility-Administered Medications Ordered in Other Visits  Medication Dose Route Frequency Provider Last Rate Last Admin  . sodium chloride flush (NS) 0.9 % injection 10 mL  10 mL Intracatheter PRN Taiyana Kissler, [name], MD   10 mL at 11/11/19 1710    OBJECTIVE: White woman in no acute distress  There were no vitals filed for this visit.   There is no height or weight on file to calculate BMI.   Wt Readings from Last 3 Encounters:  11/26/19 202 lb 1.6 oz (91.7 kg)  11/19/19 202 lb 12 oz (92 kg)  11/11/19 200 lb 4.8 oz (90.9 kg)     ECOG FS:1 - Symptomatic but completely ambulatory  Sclerae unicteric, EOMs intact Wearing a mask No cervical or supraclavicular adenopathy Lungs no rales or rhonchi Heart regular rate and rhythm Abd soft, nontender, positive bowel sounds MSK no focal spinal tenderness, no upper extremity lymphedema Neuro: nonfocal, well oriented, appropriate affect Breasts: Deferred.   LAB RESULTS:  CMP     Component Value Date/Time    NA 141 11/26/2019 1305   K 3.7 11/26/2019 1305   CL 110 11/26/2019 1305   CO2 23 11/26/2019 1305   GLUCOSE 117 (H) 11/26/2019 1305   BUN 18 11/26/2019 1305   CREATININE 0.63 11/26/2019 1305   CREATININE 0.76 07/30/2019 0825   CALCIUM 9.8 11/26/2019 1305   PROT 6.7 11/26/2019 1305   ALBUMIN 3.8 11/26/2019 1305   AST 30 11/26/2019 1305   AST 14 (L) 07/30/2019 0825   ALT 47 (H) 11/26/2019 1305   ALT 11 07/30/2019 0825   ALKPHOS 97 11/26/2019 1305   BILITOT 0.4 11/26/2019 1305   BILITOT 0.4 07/30/2019 0825   GFRNONAA >60 11/26/2019 1305   GFRNONAA >60 07/30/2019 0825   GFRAA >60 11/26/2019 1305   GFRAA >60 07/30/2019 0825    No results found for: TOTALPROTELP, ALBUMINELP, A1GS, A2GS, BETS, BETA2SER, GAMS, MSPIKE, SPEI  Lab Results  Component Value Date   WBC 2.3 (L) 11/26/2019   NEUTROABS 1.0 (L) 11/26/2019   HGB 9.7 (L) 11/26/2019   HCT 28.6 (L) 11/26/2019   MCV 96.0 11/26/2019   PLT 103 (L) 11/26/2019    No results found for: LABCA2  No components found for: DGLOVF643  No results for input(s): INR in the last 168 hours.  No results found for: LABCA2  No results found for: PIR518  No results found for: ACZ660  No results found for: YTK160  No results found for: CA2729  No components found for: HGQUANT  No results found for: CEA1 / No results found for: CEA1   No results found for: AFPTUMOR  No results found for: CHROMOGRNA  No results found for: KPAFRELGTCHN, LAMBDASER, KAPLAMBRATIO (kappa/lambda light chains)  No results found for: HGBA, HGBA2QUANT, HGBFQUANT, HGBSQUAN (Hemoglobinopathy evaluation)   No results found for: LDH  No results found for: IRON, TIBC, IRONPCTSAT (Iron and TIBC)  No results found for: FERRITIN  Urinalysis    Component Value Date/Time   COLORURINE YELLOW 11/26/2019 1319   APPEARANCEUR CLEAR 11/26/2019 1319   LABSPEC 1.030 11/26/2019 1319   PHURINE 5.0 11/26/2019 1319   GLUCOSEU NEGATIVE 11/26/2019 1319   HGBUR  NEGATIVE 11/26/2019 1319   BILIRUBINUR NEGATIVE 11/26/2019 1319   KETONESUR NEGATIVE 11/26/2019 1319   PROTEINUR NEGATIVE 11/26/2019 1319   NITRITE NEGATIVE 11/26/2019 1319   LEUKOCYTESUR TRACE (A) 11/26/2019 1319    STUDIES: No results found.   ELIGIBLE FOR AVAILABLE RESEARCH PROTOCOL: AET  ASSESSMENT: 59 y.o. 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  (2) definitive surgery to follow  (3) adjuvant radiation to follow-up surgery as appropriate  (4) genetics testing 11/06/2019 through the [facility] Multi-Cancer Panel + Wilms Tumor Panel found no deleterious mutations in AIP, ALK, APC, ATM, AXIN2,BAP1,  BARD1, BLM, BMPR1A, BRCA1, BRCA2, BRIP1, CASR, CDC73, CDH1, CDK4, CDKN1B, CDKN1C, CDKN2A (p14ARF), CDKN2A (p16INK4a), CEBPA, CHEK2, CTNNA1, DICER1, DIS3L2, EGFR (c.2369C>T, [name].Thr790Met variant only), EPCAM (Deletion/duplication testing only), FH, FLCN, GATA2, GPC3, GREM1 (Promoter region deletion/duplication testing only), HOXB13 (c.251G>A, [name].Gly84Glu), HRAS, KIT, MAX, MEN1, MET, MITF (c.952G>A, [name].Glu318Lys variant only), MLH1, MSH2, MSH3, MSH6, MUTYH, NBN, NF1, NF2, NTHL1, PALB2, PDGFRA, PHOX2B, PMS2, POLD1, POLE, POT1, PRKAR1A, PTCH1, PTEN, RAD50, RAD51C, RAD51D, RB1, RECQL4, RET, RNF43, RUNX1, SDHAF2, SDHA (sequence changes only), SDHB, SDHC, SDHD, SMAD4, SMARCA4, SMARCB1, SMARCE1, STK11, SUFU, TERC, TERT, TMEM127, TP53, TSC1, TSC2, VHL, WRN and WT1. The Wilms Tumor Panel offered by [facility] includes sequencing and deletion/duplication  testing of the following 7 genes: CDC73, CDKN1C, CTR9, DIS3L2, GPC3, REST, and WT1.  (a) Three variants of uncertain significance were detected - one in the AXIN2 gene called c.13A>G, one in the RNF43 gene called c.1770G>C, and one in the [facility] gene called c.85C>G.    PLAN: Debra [name] will proceed to chemo today.  She has 3 more weeks to go.  She will receive 3 doses of gastrum after each 1 of those chemo doses and I have entered the dates.  I have also asked her to make 100% sure that the dates are correct so that she does not have to wait over a long while we correct our own mistakes.  I looked at her right foot which shows a small bruise but no swelling or erythema and only minimall tender noticed when the toes are manipulated.  The skin over her port does not appear particularly thin.  I am delighted that she is doing so well and anticipate a good pathologic response once we get to surgery.  Incidentally she is very eager to get the surgery behind her!  Total encounter time 30 minutes.Sarajane Jews C. Thetis Schwimmer, MD 12/03/19 12:15 PM Medical Oncology and Hematology [facility] [place], [place] [address] Tel. [phone]    Fax. [phone]   I, Wilburn Mylar, am acting as scribe for Dr. [name]. Aden Sek.  I, Lurline Del MD, have reviewed the above documentation for accuracy and completeness, and I agree with the above.   *Total Encounter Time as defined by the Centers for Medicare and Medicaid Services includes, in addition to the face-to-face time of a patient visit (documented in the note above) non-face-to-face time: obtaining and reviewing outside history, ordering and reviewing medications, tests or procedures, care coordination (communications with other health care professionals or caregivers) and documentation in the medical record.

## 2019-12-03 NOTE — Patient Instructions (Signed)
   [facility] Discharge Instructions for Patients Receiving Chemotherapy  Today you received the following chemotherapy agents Taxol and Carboplatin   To help prevent nausea and vomiting after your treatment, we encourage you to take your nausea medication as directed.    If you develop nausea and vomiting that is not controlled by your nausea medication, call the clinic.   BELOW ARE SYMPTOMS THAT SHOULD BE REPORTED IMMEDIATELY:  *FEVER GREATER THAN 100.5 F  *CHILLS WITH OR WITHOUT FEVER  NAUSEA AND VOMITING THAT IS NOT CONTROLLED WITH YOUR NAUSEA MEDICATION  *UNUSUAL SHORTNESS OF BREATH  *UNUSUAL BRUISING OR BLEEDING  TENDERNESS IN MOUTH AND THROAT WITH OR WITHOUT PRESENCE OF ULCERS  *URINARY PROBLEMS  *BOWEL PROBLEMS  UNUSUAL RASH Items with * indicate a potential emergency and should be followed up as soon as possible.  Feel free to call the clinic should you have any questions or concerns. The clinic phone number is [phone].  Please show the CHEMO ALERT CARD at check-in to the Emergency Department and triage nurse.   

## 2019-12-04 ENCOUNTER — Inpatient Hospital Stay: Payer: BC Managed Care – PPO

## 2019-12-04 ENCOUNTER — Ambulatory Visit: Payer: BC Managed Care – PPO

## 2019-12-04 ENCOUNTER — Telehealth: Payer: Self-pay | Admitting: Oncology

## 2019-12-04 ENCOUNTER — Other Ambulatory Visit: Payer: Self-pay

## 2019-12-04 DIAGNOSIS — C50211 Malignant neoplasm of upper-inner quadrant of right female breast: Secondary | ICD-10-CM | POA: Diagnosis not present

## 2019-12-04 MED ORDER — FILGRASTIM-AAFI 480 MCG/0.8ML IJ SOSY
480.0000 ug | PREFILLED_SYRINGE | Freq: Once | INTRAMUSCULAR | Status: AC
  Administered 2019-12-04: 480 ug via SUBCUTANEOUS
  Filled 2019-12-04: qty 0.8

## 2019-12-04 NOTE — Progress Notes (Signed)
Patient was early for injection appt charge was made aware and the patient was also. She was moved to infusion for admin.

## 2019-12-04 NOTE — Telephone Encounter (Signed)
Scheduled appts per 7/28 los. Pt to get updated appt calendar at next visit per appt notes.

## 2019-12-04 NOTE — Patient Instructions (Signed)

## 2019-12-05 ENCOUNTER — Other Ambulatory Visit: Payer: Self-pay

## 2019-12-05 ENCOUNTER — Inpatient Hospital Stay: Payer: BC Managed Care – PPO

## 2019-12-05 VITALS — BP 130/73 | HR 93 | Temp 97.9°F | Resp 16

## 2019-12-05 DIAGNOSIS — C50211 Malignant neoplasm of upper-inner quadrant of right female breast: Secondary | ICD-10-CM | POA: Diagnosis not present

## 2019-12-05 DIAGNOSIS — Z170 Estrogen receptor positive status [ER+]: Secondary | ICD-10-CM

## 2019-12-05 MED ORDER — FILGRASTIM-AAFI 480 MCG/0.8ML IJ SOSY
480.0000 ug | PREFILLED_SYRINGE | Freq: Once | INTRAMUSCULAR | Status: AC
  Administered 2019-12-05: 480 ug via SUBCUTANEOUS
  Filled 2019-12-05: qty 0.8

## 2019-12-05 NOTE — Patient Instructions (Signed)

## 2019-12-06 ENCOUNTER — Inpatient Hospital Stay: Payer: BC Managed Care – PPO

## 2019-12-06 ENCOUNTER — Ambulatory Visit: Payer: BC Managed Care – PPO

## 2019-12-06 ENCOUNTER — Other Ambulatory Visit: Payer: Self-pay

## 2019-12-06 VITALS — BP 132/73 | HR 81 | Temp 97.9°F | Resp 18 | Ht 62.9 in

## 2019-12-06 DIAGNOSIS — Z170 Estrogen receptor positive status [ER+]: Secondary | ICD-10-CM

## 2019-12-06 DIAGNOSIS — C50211 Malignant neoplasm of upper-inner quadrant of right female breast: Secondary | ICD-10-CM | POA: Diagnosis not present

## 2019-12-06 MED ORDER — FILGRASTIM-AAFI 480 MCG/0.8ML IJ SOSY
480.0000 ug | PREFILLED_SYRINGE | Freq: Once | INTRAMUSCULAR | Status: AC
  Administered 2019-12-06: 480 ug via SUBCUTANEOUS

## 2019-12-06 NOTE — Patient Instructions (Signed)

## 2019-12-10 ENCOUNTER — Ambulatory Visit: Payer: BC Managed Care – PPO

## 2019-12-10 ENCOUNTER — Other Ambulatory Visit: Payer: BC Managed Care – PPO

## 2019-12-10 MED FILL — Dexamethasone Sodium Phosphate Inj 100 MG/10ML: INTRAMUSCULAR | Qty: 1 | Status: AC

## 2019-12-11 ENCOUNTER — Inpatient Hospital Stay: Payer: BC Managed Care – PPO

## 2019-12-11 ENCOUNTER — Other Ambulatory Visit: Payer: Self-pay

## 2019-12-11 ENCOUNTER — Inpatient Hospital Stay (HOSPITAL_BASED_OUTPATIENT_CLINIC_OR_DEPARTMENT_OTHER): Payer: BC Managed Care – PPO | Admitting: Adult Health

## 2019-12-11 ENCOUNTER — Encounter: Payer: Self-pay | Admitting: Adult Health

## 2019-12-11 ENCOUNTER — Inpatient Hospital Stay: Payer: BC Managed Care – PPO | Attending: Oncology

## 2019-12-11 ENCOUNTER — Encounter: Payer: Self-pay | Admitting: *Deleted

## 2019-12-11 VITALS — BP 132/58 | HR 87 | Temp 98.2°F | Resp 18 | Wt 204.6 lb

## 2019-12-11 DIAGNOSIS — Z9221 Personal history of antineoplastic chemotherapy: Secondary | ICD-10-CM | POA: Diagnosis not present

## 2019-12-11 DIAGNOSIS — Z800 Family history of malignant neoplasm of digestive organs: Secondary | ICD-10-CM | POA: Insufficient documentation

## 2019-12-11 DIAGNOSIS — C50211 Malignant neoplasm of upper-inner quadrant of right female breast: Secondary | ICD-10-CM

## 2019-12-11 DIAGNOSIS — Z808 Family history of malignant neoplasm of other organs or systems: Secondary | ICD-10-CM | POA: Diagnosis not present

## 2019-12-11 DIAGNOSIS — Z170 Estrogen receptor positive status [ER+]: Secondary | ICD-10-CM

## 2019-12-11 DIAGNOSIS — Z923 Personal history of irradiation: Secondary | ICD-10-CM | POA: Insufficient documentation

## 2019-12-11 DIAGNOSIS — Z87891 Personal history of nicotine dependence: Secondary | ICD-10-CM | POA: Diagnosis not present

## 2019-12-11 DIAGNOSIS — Z803 Family history of malignant neoplasm of breast: Secondary | ICD-10-CM | POA: Insufficient documentation

## 2019-12-11 DIAGNOSIS — R200 Anesthesia of skin: Secondary | ICD-10-CM | POA: Diagnosis not present

## 2019-12-11 DIAGNOSIS — Z171 Estrogen receptor negative status [ER-]: Secondary | ICD-10-CM | POA: Insufficient documentation

## 2019-12-11 LAB — COMPREHENSIVE METABOLIC PANEL
ALT: 21 U/L (ref 0–44)
AST: 18 U/L (ref 15–41)
Albumin: 3.5 g/dL (ref 3.5–5.0)
Alkaline Phosphatase: 92 U/L (ref 38–126)
Anion gap: 7 (ref 5–15)
BUN: 10 mg/dL (ref 6–20)
CO2: 24 mmol/L (ref 22–32)
Calcium: 9.5 mg/dL (ref 8.9–10.3)
Chloride: 111 mmol/L (ref 98–111)
Creatinine, Ser: 0.64 mg/dL (ref 0.44–1.00)
GFR calc Af Amer: >60 mL/min (ref >60)
GFR calc non Af Amer: >60 mL/min (ref >60)
Glucose, Bld: 113 mg/dL — ABNORMAL HIGH (ref 70–99)
Potassium: 3.7 mmol/L (ref 3.5–5.1)
Sodium: 142 mmol/L (ref 135–145)
Total Bilirubin: 0.3 mg/dL (ref 0.3–1.2)
Total Protein: 6.1 g/dL — ABNORMAL LOW (ref 6.5–8.1)

## 2019-12-11 LAB — CBC WITH DIFFERENTIAL/PLATELET
Abs Immature Granulocytes: 0.02 10*3/uL (ref 0.00–0.07)
Basophils Absolute: 0 10*3/uL (ref 0.0–0.1)
Basophils Relative: 1 %
Eosinophils Absolute: 0 10*3/uL (ref 0.0–0.5)
Eosinophils Relative: 1 %
HCT: 23.2 % — ABNORMAL LOW (ref 36.0–46.0)
Hemoglobin: 7.8 g/dL — ABNORMAL LOW (ref 12.0–15.0)
Immature Granulocytes: 1 %
Lymphocytes Relative: 36 %
Lymphs Abs: 1 10*3/uL (ref 0.7–4.0)
MCH: 32.4 pg (ref 26.0–34.0)
MCHC: 33.6 g/dL (ref 30.0–36.0)
MCV: 96.3 fL (ref 80.0–100.0)
Monocytes Absolute: 0.5 10*3/uL (ref 0.1–1.0)
Monocytes Relative: 20 %
Neutro Abs: 1.1 10*3/uL — ABNORMAL LOW (ref 1.7–7.7)
Neutrophils Relative %: 41 %
Platelets: 123 10*3/uL — ABNORMAL LOW (ref 150–400)
RBC: 2.41 MIL/uL — ABNORMAL LOW (ref 3.87–5.11)
RDW: 15.2 % (ref 11.5–15.5)
WBC: 2.7 10*3/uL — ABNORMAL LOW (ref 4.0–10.5)
nRBC: 0 % (ref 0.0–0.2)

## 2019-12-11 LAB — PREPARE RBC (CROSSMATCH)

## 2019-12-11 MED ORDER — ACETAMINOPHEN 325 MG PO TABS
ORAL_TABLET | ORAL | Status: AC
  Filled 2019-12-11: qty 2

## 2019-12-11 MED ORDER — HEPARIN SOD (PORK) LOCK FLUSH 100 UNIT/ML IV SOLN
500.0000 [IU] | Freq: Every day | INTRAVENOUS | Status: AC | PRN
  Administered 2019-12-11: 500 [IU]
  Filled 2019-12-11: qty 5

## 2019-12-11 MED ORDER — SODIUM CHLORIDE 0.9% IV SOLUTION
250.0000 mL | Freq: Once | INTRAVENOUS | Status: AC
  Administered 2019-12-11: 250 mL via INTRAVENOUS
  Filled 2019-12-11: qty 250

## 2019-12-11 MED ORDER — ACETAMINOPHEN 325 MG PO TABS
650.0000 mg | ORAL_TABLET | Freq: Once | ORAL | Status: AC
  Administered 2019-12-11: 650 mg via ORAL

## 2019-12-11 MED ORDER — SODIUM CHLORIDE 0.9% FLUSH
10.0000 mL | INTRAVENOUS | Status: AC | PRN
  Administered 2019-12-11: 10 mL
  Filled 2019-12-11: qty 10

## 2019-12-11 MED ORDER — DIPHENHYDRAMINE HCL 25 MG PO CAPS
25.0000 mg | ORAL_CAPSULE | Freq: Once | ORAL | Status: AC
  Administered 2019-12-11: 25 mg via ORAL

## 2019-12-11 MED ORDER — DIPHENHYDRAMINE HCL 25 MG PO CAPS
ORAL_CAPSULE | ORAL | Status: AC
  Filled 2019-12-11: qty 1

## 2019-12-11 MED ORDER — SODIUM CHLORIDE 0.9% FLUSH
10.0000 mL | INTRAVENOUS | Status: DC | PRN
  Administered 2019-12-11: 10 mL
  Filled 2019-12-11: qty 10

## 2019-12-11 NOTE — Patient Instructions (Signed)

## 2019-12-11 NOTE — Progress Notes (Addendum)
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   She started on weekly carboplatin and paclitaxel on 10/08/2019. Today is week 10.  Because of the low white cell counts she is receiving growth factor  days to 3 and 4 of each remaining cycles.  REVIEW OF SYSTEMS: [name] is doing well.  She has noted an increased numbness in Debra [name] right fourth and fifth digit, that is constant and she has soreness in these fingers as well.  She denies wrist or elbow pain.  This was previously intermittent and resolved after a couple of days, however after cycle 9 of treatment, she says that it has been constant in those fingers.  She also notes some intermittent numbness in Debra [name] entire right hand that comes and goes.  She denies any numbness or tingling in the pads of Debra [name] left finger tips.  She denies numbness or tingling in the toes, but notes that she did break Debra [name] right fourth toe last week and it remains swollen and sore.    [name] denies any fever or chills.  She notes Debra [name] fatigue is present, but stable.  She is able to get things done that are needed.  She denies any nausea, vomiting, bowel/bladder changes, headaches, vision issues, mucositis, or any other concerns.  A detailed ROS was  otherwise non contributory.  HISTORY OF CURRENT ILLNESS: From the original intake note:  [name] herself palpated a mass in the upper-inner right breast in February 2021.  It appeared to increase in size over the next 3 weeks. Physical exam performed at [facility] 07/15/2019 confirmed a 6 mm firm, oval, palpable mass in the upper-inner right breast. She underwent bilateral diagnostic mammography with tomography and bilateral breast ultrasonography on 07/15/2019 showing: breast density category C; 4.6 cm mass in the right breast at 1 o'clock; single abnormal-appearing lymph node; bilateral benign cysts.  Accordingly on 07/21/2019 she proceeded to biopsy of the right breast area in question. 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 Prognostic indicators significant for: estrogen receptor, 60% positive with weak staining intensity and progesterone receptor, 0% negative. Proliferation marker Ki67 at 90%. HER2 equivocal by immunohistochemistry (2+), but negative by fluorescent in situ hybridization with a signals ratio 1.24 and number per cell 2.55.  The questionable right axillary lymph node was biopsied as well and was benign (concordant).  The patient's subsequent history is as detailed below.   PAST MEDICAL HISTORY: Past Medical History:  Diagnosis Date  . Family history of breast cancer   . Family history of colon cancer   . Family history of nonmelanoma skin cancer     PAST SURGICAL HISTORY: Past Surgical History:  Procedure Laterality Date  . ABDOMINAL HYSTERECTOMY    . LUNG BIOPSY    . PORTACATH PLACEMENT Left 08/06/2019   Procedure: INSERTION PORT-A-CATH WITH  ULTRASOUND GUIDANCE;  Surgeon: [name], MD;  Location: [facility];  Service: General;  Laterality: Left;  Status post bilateral salpingo-oophorectomy   FAMILY HISTORY: Family History  Problem  Relation Age of Onset  . Skin cancer Sister        non-melanoma dx. in Debra [name] 71s  . Other Sister        normal genetic testing for hereditary cancer risks  . Colon cancer Paternal Grandfather        dx. in his 25s  . Wilm's tumor Son        dx. 9 months  . Breast cancer Other        dx. in Debra [name] 1s (PGM's sister)  . Wilm's tumor Nephew 2  . Breast cancer Other        dx. in Debra [name] 32s (PGM's niece)    Debra [name] father is 62 years old as of 07/2019. Debra [name] mother was murdered at age 54 (domestic violence).  The patient has 3 sisters and no brothers. She reports a third cousin (grandmother's sister's daughter) with breast cancer in Debra [name] 82's. She denies a family history of ovarian, prostate, or pancreatic cancer. She does report colon cancer in Debra [name] paternal grandfather in his 45's, non-melanoma skin cancer in Debra [name] sister in Debra [name] 4's, and Wilms tumor in Debra [name] son at 2 months.   GYNECOLOGIC HISTORY:  Patient's last menstrual period was 05/09/2011. Menarche: 42-22 years old Age at first live birth: 59 years old [place] P 3 LMP 2013 Contraceptive never used HRT used for approximately 9 months  Hysterectomy? Yes, 2013 BSO? 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 Husband [name] is an Art gallery manager. She lives at home with husband [name]. Daughter [name], age 60, is a poet and Pharmacist, hospital in [place], [place]. Son [name], age 60, is an Production designer, theatre/television/film in [place] in [place], [place]. Son Danell Vazquez, age 60, is a Marketing executive working in Artist in [place], [place] (at the [employer]). Jilliana has three grandchildren. She is a Media planner.    ADVANCED DIRECTIVES: In the absence of any documentation to the contrary, the patient's spouse is their HCPOA.    HEALTH MAINTENANCE: Social History   Tobacco Use  . Smoking status: Former Research scientist (life sciences)  . Smokeless tobacco: Never Used  Substance Use Topics  . Alcohol  use: Yes  . Drug use: Never     Colonoscopy: none on file  PAP: none on file (s/p hysterectomy)  Bone density: n/a (59)   Allergies  Allergen Reactions  . Other Rash    Walnuts give Debra [name] a rash Walnuts give Debra [name] a rash Walnuts give Debra [name] a rash Walnuts give Debra [name] a rash     Current Outpatient Medications  Medication Sig Dispense Refill  . Multiple Vitamin (MULTI-VITAMIN DAILY PO) Take by mouth.    . phenazopyridine (PYRIDIUM) 200 MG tablet Take 1 tablet (200 mg total) by mouth 3 (three) times daily as needed for pain. 12 tablet 0  . traMADol (ULTRAM) 50 MG tablet Take 1 tablet (50 mg total) by mouth every 6 (six) hours as needed for moderate pain or severe pain. 20 tablet 0   No current facility-administered medications for this visit.    OBJECTIVE: White woman in no acute distress  Vitals:   12/11/19 1000  BP: (!) 132/58  Pulse: 87  Resp: 18  Temp: 98.2 F (36.8 C)  SpO2: 100%  Body mass index is 36.36 kg/m.   Wt Readings from Last 3 Encounters:  12/11/19 204 lb 9.6 oz (92.8 kg)  12/03/19 200 lb 1.6 oz (90.8 kg)  11/26/19 202 lb 1.6 oz (91.7 kg)     ECOG FS:1 - Symptomatic but completely ambulatory GENERAL: Patient is a well appearing female in no acute distress HEENT:  Sclerae anicteric.  Oropharynx clear and moist. No ulcerations or evidence of oropharyngeal candidiasis. Neck is supple.  NODES:  No cervical, supraclavicular, or axillary lymphadenopathy palpated.  BREAST EXAM:  Deferred. LUNGS:  Clear to auscultation bilaterally.  No wheezes or rhonchi. HEART:  Regular rate and rhythm. No murmur appreciated. ABDOMEN:  Soft, nontender.  Positive, normoactive bowel sounds. No organomegaly palpated. MSK:  No focal spinal tenderness to palpation. Full range of motion bilaterally in the upper extremities. EXTREMITIES:  No peripheral edema.   SKIN:  Clear with no obvious rashes or skin changes. No nail dyscrasia. NEURO:  Nonfocal. Well oriented.  Appropriate  affect.     LAB RESULTS:  CMP     Component Value Date/Time   NA 142 12/11/2019 0950   K 3.7 12/11/2019 0950   CL 111 12/11/2019 0950   CO2 24 12/11/2019 0950   GLUCOSE 113 (H) 12/11/2019 0950   BUN 10 12/11/2019 0950   CREATININE 0.64 12/11/2019 0950   CREATININE 0.76 07/30/2019 0825   CALCIUM 9.5 12/11/2019 0950   PROT 6.1 (L) 12/11/2019 0950   ALBUMIN 3.5 12/11/2019 0950   AST 18 12/11/2019 0950   AST 14 (L) 07/30/2019 0825   ALT 21 12/11/2019 0950   ALT 11 07/30/2019 0825   ALKPHOS 92 12/11/2019 0950   BILITOT 0.3 12/11/2019 0950   BILITOT 0.4 07/30/2019 0825   GFRNONAA >60 12/11/2019 0950   GFRNONAA >60 07/30/2019 0825   GFRAA >60 12/11/2019 0950   GFRAA >60 07/30/2019 0825    No results found for: TOTALPROTELP, ALBUMINELP, A1GS, A2GS, BETS, BETA2SER, GAMS, MSPIKE, SPEI  Lab Results  Component Value Date   WBC 2.7 (L) 12/11/2019   NEUTROABS 1.1 (L) 12/11/2019   HGB 7.8 (L) 12/11/2019   HCT 23.2 (L) 12/11/2019   MCV 96.3 12/11/2019   PLT 123 (L) 12/11/2019    No results found for: LABCA2  No components found for: YOVZCH885  No results for input(s): INR in the last 168 hours.  No results found for: LABCA2  No results found for: OYD741  No results found for: OIN867  No results found for: EHM094  No results found for: CA2729  No components found for: HGQUANT  No results found for: CEA1 / No results found for: CEA1   No results found for: AFPTUMOR  No results found for: CHROMOGRNA  No results found for: KPAFRELGTCHN, LAMBDASER, KAPLAMBRATIO (kappa/lambda light chains)  No results found for: HGBA, HGBA2QUANT, HGBFQUANT, HGBSQUAN (Hemoglobinopathy evaluation)   No results found for: LDH  No results found for: IRON, TIBC, IRONPCTSAT (Iron and TIBC)  No results found for: FERRITIN  Urinalysis    Component Value Date/Time   COLORURINE YELLOW 11/26/2019 1319   APPEARANCEUR CLEAR 11/26/2019 1319   LABSPEC 1.030 11/26/2019 1319    PHURINE 5.0 11/26/2019 1319   GLUCOSEU NEGATIVE 11/26/2019 1319   HGBUR NEGATIVE 11/26/2019 1319   BILIRUBINUR NEGATIVE 11/26/2019 1319   KETONESUR NEGATIVE 11/26/2019 1319   PROTEINUR NEGATIVE 11/26/2019 1319   NITRITE NEGATIVE 11/26/2019 1319   LEUKOCYTESUR TRACE (A) 11/26/2019 1319    STUDIES: No results found.   ELIGIBLE FOR AVAILABLE  RESEARCH PROTOCOL: AET  ASSESSMENT: 59 y.o. 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  (2) definitive surgery to follow  (3) adjuvant radiation to follow-up surgery as appropriate  (4) genetics testing 11/06/2019 through the [facility] Multi-Cancer Panel + Wilms Tumor Panel found no deleterious mutations in AIP, ALK, APC, ATM, AXIN2,BAP1,  BARD1, BLM, BMPR1A, BRCA1, BRCA2, BRIP1, CASR, CDC73, CDH1, CDK4, CDKN1B, CDKN1C, CDKN2A (p14ARF), CDKN2A (p16INK4a), CEBPA, CHEK2, CTNNA1, DICER1, DIS3L2, EGFR (c.2369C>T, p.Thr790Met variant only), EPCAM (Deletion/duplication testing only), FH, FLCN, GATA2, GPC3, GREM1 (Promoter region deletion/duplication testing only), HOXB13 (c.251G>A, p.Gly84Glu), HRAS, KIT, MAX, MEN1, MET, MITF (c.952G>A, p.Glu318Lys variant only), MLH1, MSH2, MSH3, MSH6, MUTYH, NBN, NF1, NF2, NTHL1, PALB2, PDGFRA, PHOX2B, PMS2, POLD1, POLE, POT1, PRKAR1A, PTCH1, PTEN, RAD50, RAD51C, RAD51D, RB1, RECQL4, RET, RNF43, RUNX1, SDHAF2, SDHA (sequence changes only), SDHB, SDHC, SDHD, SMAD4, SMARCA4, SMARCB1, SMARCE1, STK11, SUFU, TERC, TERT, TMEM127, TP53, TSC1, TSC2, VHL, WRN and WT1. The Wilms  Tumor Panel offered by [facility] includes sequencing and deletion/duplication testing of the following 7 genes: CDC73, CDKN1C, CTR9, DIS3L2, GPC3, REST, and WT1.  (a) Three variants of uncertain significance were detected - one in the AXIN2 gene called c.13A>G, one in the RNF43 gene called c.1770G>C, and one in the [facility] gene called c.85C>G.    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  Debra [name] tumor is not palpable indicating a response to therapy. Due to the worsening numbness in Debra [name] right two digits, she met with myself and Dr. Jana Hakim and he has recommended that she stop continued chemotherapy 3 cycles early.    I have let our breast navigator, Dawn know and she will coordinate moving up Debra [name] breast MRI and getting Abbigael in with Dr. Ninfa Linden sooner, so that she can go ahead to surgery.    Debra [name] hemoglobin is slightly decreased at 7.8 and she will receive one unit of PRBCs today.    She will return in 4 weeks for labs and f/u with Dr. Jana Hakim.  She knows to call for any questions that may arise between now and Debra [name] next appointment.  We are happy to see Debra [name] sooner if needed.  Total encounter time 30 minutes.Wilber Bihari, NP 12/11/19 3:42 PM Medical Oncology and Hematology [facility] [place], [place] [address] Tel. [phone]    Fax. [phone]   ADDENDUM: Shawnese made a valiant effort to get the very last bit of chemotherapy possible.  She has required white cell support and blood transfusion.  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  Thankfully this is only grade 1 at present but if we continue taxane treatments at this could become disabling.  Accordingly she is done with chemotherapy.  She will "ring the bell" today.  We will try to move up Debra [name] breast MRI and we will alert Debra [name] surgeon Dr. Ninfa Linden that  she will be ready for surgery earlier than anticipated.  I personally saw this patient and performed a substantive portion of this encounter with the  listed APP documented above.   [name], MD Medical Oncology and Hematology [facility] [address] [place], [place] [address] Tel. [phone]    Fax. [phone]     *Total Encounter Time as defined by the Centers for Medicare and Medicaid Services includes, in addition to the face-to-face time of a patient visit (documented in the note above) non-face-to-face time: obtaining and reviewing outside history, ordering and reviewing medications, tests or procedures, care coordination (communications with other health care professionals or caregivers) and documentation in the medical record.

## 2019-12-11 NOTE — Patient Instructions (Signed)

## 2019-12-12 ENCOUNTER — Inpatient Hospital Stay: Payer: BC Managed Care – PPO

## 2019-12-12 ENCOUNTER — Encounter: Payer: Self-pay | Admitting: *Deleted

## 2019-12-12 LAB — TYPE AND SCREEN
ABO/RH(D): AB POS
Antibody Screen: NEGATIVE
Unit division: 0

## 2019-12-12 LAB — BPAM RBC
Blood Product Expiration Date: 202108232359
ISSUE DATE / TIME: 202108051229
Unit Type and Rh: 6200

## 2019-12-13 ENCOUNTER — Inpatient Hospital Stay: Payer: BC Managed Care – PPO

## 2019-12-15 ENCOUNTER — Inpatient Hospital Stay: Payer: BC Managed Care – PPO

## 2019-12-17 ENCOUNTER — Other Ambulatory Visit: Payer: BC Managed Care – PPO

## 2019-12-17 ENCOUNTER — Inpatient Hospital Stay: Payer: BC Managed Care – PPO | Admitting: Adult Health

## 2019-12-17 ENCOUNTER — Ambulatory Visit: Payer: BC Managed Care – PPO | Admitting: Adult Health

## 2019-12-17 ENCOUNTER — Inpatient Hospital Stay: Payer: BC Managed Care – PPO

## 2019-12-17 ENCOUNTER — Ambulatory Visit: Payer: BC Managed Care – PPO

## 2019-12-18 ENCOUNTER — Inpatient Hospital Stay: Payer: BC Managed Care – PPO

## 2019-12-19 ENCOUNTER — Ambulatory Visit
Admission: RE | Admit: 2019-12-19 | Discharge: 2019-12-19 | Disposition: A | Discharge status: Home / Self Care | Payer: BC Managed Care – PPO | Source: Ambulatory Visit | Attending: Oncology | Admitting: Oncology

## 2019-12-19 ENCOUNTER — Inpatient Hospital Stay: Payer: BC Managed Care – PPO

## 2019-12-19 DIAGNOSIS — C50211 Malignant neoplasm of upper-inner quadrant of right female breast: Secondary | ICD-10-CM

## 2019-12-19 DIAGNOSIS — Z170 Estrogen receptor positive status [ER+]: Secondary | ICD-10-CM

## 2019-12-19 MED ORDER — GADOBUTROL 1 MMOL/ML IV SOLN
9.0000 mL | Freq: Once | INTRAVENOUS | Status: AC | PRN
  Administered 2019-12-19: 9 mL via INTRAVENOUS

## 2019-12-20 ENCOUNTER — Inpatient Hospital Stay: Payer: BC Managed Care – PPO

## 2019-12-22 ENCOUNTER — Encounter: Payer: Self-pay | Admitting: *Deleted

## 2019-12-22 ENCOUNTER — Telehealth: Payer: Self-pay | Admitting: Adult Health

## 2019-12-22 NOTE — Telephone Encounter (Signed)
-----   Message from Gardenia Phlegm, NP sent at 12/21/2019  3:03 PM EDT -----  ----- Message ----- From: Interface, Rad Results In Sent: 12/19/2019  12:59 PM EDT To: [name], MD

## 2019-12-22 NOTE — Telephone Encounter (Signed)
Attempted to call patient about MRI results.  Asked that she return my call.  Wilber Bihari, NP

## 2019-12-23 ENCOUNTER — Other Ambulatory Visit: Payer: Self-pay | Admitting: Surgery

## 2019-12-23 DIAGNOSIS — Z853 Personal history of malignant neoplasm of breast: Secondary | ICD-10-CM

## 2019-12-24 ENCOUNTER — Ambulatory Visit: Payer: BC Managed Care – PPO | Admitting: Oncology

## 2019-12-24 ENCOUNTER — Other Ambulatory Visit: Payer: BC Managed Care – PPO

## 2019-12-24 ENCOUNTER — Ambulatory Visit: Payer: BC Managed Care – PPO

## 2019-12-25 ENCOUNTER — Encounter: Payer: Self-pay | Admitting: *Deleted

## 2019-12-25 ENCOUNTER — Ambulatory Visit: Payer: BC Managed Care – PPO

## 2019-12-25 ENCOUNTER — Other Ambulatory Visit: Payer: Self-pay | Admitting: Surgery

## 2019-12-25 DIAGNOSIS — C50211 Malignant neoplasm of upper-inner quadrant of right female breast: Secondary | ICD-10-CM

## 2019-12-25 DIAGNOSIS — Z853 Personal history of malignant neoplasm of breast: Secondary | ICD-10-CM

## 2019-12-25 DIAGNOSIS — Z170 Estrogen receptor positive status [ER+]: Secondary | ICD-10-CM

## 2019-12-26 ENCOUNTER — Telehealth: Payer: Self-pay | Admitting: *Deleted

## 2019-12-26 ENCOUNTER — Ambulatory Visit: Payer: BC Managed Care – PPO

## 2019-12-26 NOTE — Telephone Encounter (Signed)
I called and left her a message to return my call.

## 2019-12-26 NOTE — Telephone Encounter (Signed)
Left voicemail for a return phone call to answer some questions she may have.

## 2019-12-26 NOTE — Telephone Encounter (Signed)
VM left by pt stating she is trying to get in touch " with the nurse who is kind of in charge of my care and answer my questions about the plan "  " I know that the doctors met again and now I am being called about appointments but I do not know what the decisions are or what is being planned for me "  Pt left return call number as [phone].  Noted pt is under [facility] navigation- this message will be forwarded to navigators.

## 2019-12-27 ENCOUNTER — Ambulatory Visit: Payer: BC Managed Care – PPO

## 2019-12-29 ENCOUNTER — Encounter: Payer: Self-pay | Admitting: *Deleted

## 2019-12-31 ENCOUNTER — Other Ambulatory Visit: Payer: BC Managed Care – PPO

## 2020-01-05 ENCOUNTER — Other Ambulatory Visit (HOSPITAL_COMMUNITY)
Admission: RE | Admit: 2020-01-05 | Discharge: 2020-01-05 | Disposition: A | Discharge status: Home / Self Care | Payer: BC Managed Care – PPO | Source: Ambulatory Visit | Attending: Orthopaedic Surgery | Admitting: Orthopaedic Surgery

## 2020-01-05 ENCOUNTER — Telehealth: Payer: Self-pay | Admitting: Adult Health

## 2020-01-05 DIAGNOSIS — Z20822 Contact with and (suspected) exposure to covid-19: Secondary | ICD-10-CM | POA: Diagnosis not present

## 2020-01-05 DIAGNOSIS — Z01812 Encounter for preprocedural laboratory examination: Secondary | ICD-10-CM | POA: Diagnosis not present

## 2020-01-05 LAB — SARS CORONAVIRUS 2 (TAT 6-24 HRS): SARS Coronavirus 2: NEGATIVE

## 2020-01-05 NOTE — Telephone Encounter (Signed)
Cancelled 9/8 lab and port flush appt per LC's instructions. Rescheduled 9/8 follow up to 9/16 with GM per LC's instructions. Left voicemail with cancellation details and new appt date and time.

## 2020-01-06 NOTE — Progress Notes (Signed)
[id number] - [place], [place] [address] [place] [address] Phone: [phone] Fax: [phone]      Your procedure is scheduled on Thursday, January 08, 2020.  Report to [facility] Entrance "A" at 2:00 P.M., and check in at the Admitting office.  Call this number if you have problems the morning of surgery:  [phone]  Call [phone] if you have any questions prior to your surgery date Monday-Friday 8am-4pm    Remember:  Do not eat after midnight the night before your surgery  You may drink clear liquids until 1:00 PM the morning of your surgery.   Clear liquids allowed are: Water, Non-Citrus Juices (without pulp), Carbonated Beverages, Clear Tea, Black Coffee Only, and Gatorade  Please complete your PRE-SURGERY ENSURE that was provided to you by 1:00 PM the morning of surgery.  Please, if able, drink it in one setting. DO NOT SIP.     Take these medicines the morning of surgery with A SIP OF WATER:   NONE  As of today, STOP taking any Aspirin (unless otherwise instructed by your surgeon) Aleve, Naproxen, Ibuprofen, Motrin, Advil, Goody's, BC's, all herbal medications, fish oil, and all vitamins.                      Do not wear jewelry, make up, or nail polish            Do not wear lotions, powders, perfumes, or deodorant.            Do not shave 48 hours prior to surgery.            Do not bring valuables to the hospital.            [facility] is not responsible for any belongings or valuables.  Do NOT Smoke (Tobacco/Vaping) or drink Alcohol 24 hours prior to your procedure If you use a CPAP at night, you may bring all equipment for your overnight stay.   Contacts, glasses, dentures or bridgework may not be worn into surgery.      For patients admitted to the hospital, discharge time will be determined by your treatment team.   Patients discharged the day of surgery will not be allowed to drive home, and  someone needs to stay with them for 24 hours.    Special instructions:   [facility]- Preparing For Surgery  Before surgery, you can play an important role. Because skin is not sterile, your skin needs to be as free of germs as possible. You can reduce the number of germs on your skin by washing with CHG (chlorahexidine gluconate) Soap before surgery.  CHG is an antiseptic cleaner which kills germs and bonds with the skin to continue killing germs even after washing.    Oral Hygiene is also important to reduce your risk of infection.  Remember - BRUSH YOUR TEETH THE MORNING OF SURGERY WITH YOUR REGULAR TOOTHPASTE  Please do not use if you have an allergy to CHG or antibacterial soaps. If your skin becomes reddened/irritated stop using the CHG.  Do not shave (including legs and underarms) for at least 48 hours prior to first CHG shower. It is OK to shave your face.  Please follow these instructions carefully.   1. Shower the NIGHT BEFORE SURGERY and the MORNING OF SURGERY with CHG Soap.   2. If you chose to wash your hair, wash your hair first as usual with your normal  shampoo.  3. After you shampoo, rinse your hair and body thoroughly to remove the shampoo.  4. Use CHG as you would any other liquid soap. You can apply CHG directly to the skin and wash gently with a scrungie or a clean washcloth.   5. Apply the CHG Soap to your body ONLY FROM THE NECK DOWN.  Do not use on open wounds or open sores. Avoid contact with your eyes, ears, mouth and genitals (private parts). Wash Face and genitals (private parts)  with your normal soap.   6. Wash thoroughly, paying special attention to the area where your surgery will be performed.  7. Thoroughly rinse your body with warm water from the neck down.  8. DO NOT shower/wash with your normal soap after using and rinsing off the CHG Soap.  9. Pat yourself dry with a CLEAN TOWEL.  10. Wear CLEAN PAJAMAS to bed the night before  surgery  11. Place CLEAN SHEETS on your bed the night of your first shower and DO NOT SLEEP WITH PETS.   Day of Surgery: Wear Clean/Comfortable clothing the morning of surgery Do not apply any deodorants/lotions.   Remember to brush your teeth WITH YOUR REGULAR TOOTHPASTE.   Please read over the following fact sheets that you were given.

## 2020-01-07 ENCOUNTER — Encounter (HOSPITAL_COMMUNITY): Payer: Self-pay

## 2020-01-07 ENCOUNTER — Ambulatory Visit
Admission: RE | Admit: 2020-01-07 | Discharge: 2020-01-07 | Disposition: A | Discharge status: Home / Self Care | Payer: BC Managed Care – PPO | Source: Ambulatory Visit | Attending: Surgery | Admitting: Surgery

## 2020-01-07 ENCOUNTER — Other Ambulatory Visit: Payer: Self-pay

## 2020-01-07 ENCOUNTER — Encounter (HOSPITAL_COMMUNITY)
Admission: RE | Admit: 2020-01-07 | Discharge: 2020-01-07 | Disposition: A | Discharge status: Home / Self Care | Payer: BC Managed Care – PPO | Source: Ambulatory Visit | Attending: Surgery | Admitting: Surgery

## 2020-01-07 DIAGNOSIS — C50911 Malignant neoplasm of unspecified site of right female breast: Secondary | ICD-10-CM | POA: Diagnosis not present

## 2020-01-07 DIAGNOSIS — Z01812 Encounter for preprocedural laboratory examination: Secondary | ICD-10-CM | POA: Insufficient documentation

## 2020-01-07 DIAGNOSIS — Z87891 Personal history of nicotine dependence: Secondary | ICD-10-CM | POA: Insufficient documentation

## 2020-01-07 DIAGNOSIS — Z803 Family history of malignant neoplasm of breast: Secondary | ICD-10-CM | POA: Insufficient documentation

## 2020-01-07 DIAGNOSIS — Z79899 Other long term (current) drug therapy: Secondary | ICD-10-CM | POA: Diagnosis not present

## 2020-01-07 DIAGNOSIS — Z853 Personal history of malignant neoplasm of breast: Secondary | ICD-10-CM

## 2020-01-07 HISTORY — DX: Malignant (primary) neoplasm, unspecified: C80.1

## 2020-01-07 HISTORY — DX: Family history of other specified conditions: Z84.89

## 2020-01-07 LAB — CBC
HCT: 36.5 % (ref 36.0–46.0)
Hemoglobin: 11.8 g/dL — ABNORMAL LOW (ref 12.0–15.0)
MCH: 32.9 pg (ref 26.0–34.0)
MCHC: 32.3 g/dL (ref 30.0–36.0)
MCV: 101.7 fL — ABNORMAL HIGH (ref 80.0–100.0)
Platelets: 235 10*3/uL (ref 150–400)
RBC: 3.59 MIL/uL — ABNORMAL LOW (ref 3.87–5.11)
RDW: 15.1 % (ref 11.5–15.5)
WBC: 6.4 10*3/uL (ref 4.0–10.5)
nRBC: 0 % (ref 0.0–0.2)

## 2020-01-07 LAB — BASIC METABOLIC PANEL
Anion gap: 9 (ref 5–15)
BUN: 9 mg/dL (ref 6–20)
CO2: 25 mmol/L (ref 22–32)
Calcium: 9.7 mg/dL (ref 8.9–10.3)
Chloride: 107 mmol/L (ref 98–111)
Creatinine, Ser: 0.64 mg/dL (ref 0.44–1.00)
GFR calc Af Amer: >60 mL/min (ref >60)
GFR calc non Af Amer: >60 mL/min (ref >60)
Glucose, Bld: 84 mg/dL (ref 70–99)
Potassium: 4 mmol/L (ref 3.5–5.1)
Sodium: 141 mmol/L (ref 135–145)

## 2020-01-07 NOTE — Progress Notes (Signed)
PCP - [facility] Cardiologist - Denies Oncologist: Dr. Lurline Del  PPM/ICD - Denies  Chest x-ray - 08/06/19  EKG - N/A Stress Test - Denies ECHO - 08/05/19 Cardiac Cath - Denies  Sleep Study - Denies  Pt denies being diabetic.   Blood Thinner Instructions: N/A Aspirin Instructions: N/A  ERAS Protcol - Yes PRE-SURGERY Ensure   COVID TEST- 01/05/20   Anesthesia review: Yes, radioactive seed  Patient denies shortness of breath, fever, cough and chest pain at PAT appointment   All instructions explained to the patient, with a verbal understanding of the material. Patient agrees to go over the instructions while at home for a better understanding. Patient also instructed to self quarantine after being tested for COVID-19. The opportunity to ask questions was provided.

## 2020-01-07 NOTE — Anesthesia Preprocedure Evaluation (Addendum)
                          Anesthesia Physical Anesthesia Plan  ASA: II  Anesthesia Plan: General   Post-op Pain Management:    Induction: Intravenous  PONV Risk Score and Plan: 3 and Ondansetron, Dexamethasone, Midazolam and Treatment may vary due to age or medical condition  Airway Management Planned: LMA  Additional Equipment: None  Intra-op Plan:   Post-operative Plan: Extubation in OR  Informed Consent: I have reviewed the patients History and Physical, chart, labs and discussed the procedure including the risks, benefits and alternatives for the proposed anesthesia with the patient or authorized representative who has indicated his/her understanding and acceptance.     Dental advisory given  Plan Discussed with:   Anesthesia Plan Comments:         Anesthesia Quick Evaluation   Anesthesia Plan  ASA: III  Anesthesia Plan: Regional and General   Post-op Pain Management: GA combined w/ Regional for post-op pain   Induction:   PONV Risk Score and Plan: 2 and Midazolam, Scopolamine patch - Pre-op, Ondansetron and Dexamethasone  Airway Management Planned: LMA  Additional Equipment:   Intra-op Plan:   Post-operative Plan:   Informed Consent:   Plan Discussed with:   Anesthesia Plan Comments: (PAT note written 01/07/2020 by Myra Gianotti, PA-C. )       Anesthesia Quick Evaluation                                  Anesthesia Evaluation  Patient identified by MRN, date of birth, ID band Patient awake    Reviewed: Allergy & Precautions, NPO status , Patient's Chart, lab work & pertinent test results  History of Anesthesia Complications Negative for: history of anesthetic complications  Airway Mallampati: III  TM Distance: >3 FB Neck ROM: Full    Dental  (+) Teeth Intact   Pulmonary neg pulmonary ROS, former smoker,    Pulmonary exam normal        Cardiovascular negative cardio ROS Normal  cardiovascular exam     Neuro/Psych negative neurological ROS  negative psych ROS   GI/Hepatic negative GI ROS, Neg liver ROS,   Endo/Other  negative endocrine ROS  Renal/GU negative Renal ROS  negative genitourinary   Musculoskeletal negative musculoskeletal ROS (+)   Abdominal   Peds  Hematology negative hematology ROS (+)   Anesthesia Other Findings  Breast cancer  Reproductive/Obstetrics

## 2020-01-07 NOTE — H&P (Signed)
   Debra [name] Appointment: 12/23/2019 2:50 PM Location: [facility] Patient #: [id number] DOB: [birth year] Married / Language: Cleophus Molt / Race: White Female   History of Present Illness (Varian Innes A. Ninfa Linden MD; 12/23/2019 3:18 PM) The patient is a 59 year old female who presents with breast cancer.  Chief complaint: Right breast cancer  She is now status post neoadjuvant chemotherapy for right breast cancer. She tolerated this well. She has now had a follow-up MRI which shows complete resolution of the 5 cm cancer in the right breast which was in the inner upper quadrant. There are no enlarged lymph nodes. She is doing well.   Allergies (Chanel Teressa Senter, CMA; 12/23/2019 3:05 PM) No Known Allergies  [07/29/2019]: Allergies Reconciled   Medication History (Chanel Teressa Senter, [place]; 12/23/2019 3:05 PM) No Current Medications Medications Reconciled  Vitals (Chanel Nolan CMA; 12/23/2019 3:05 PM) 12/23/2019 3:05 PM Weight: 206.5 lb Height: 69.5in Body Surface Area: 2.1 m Body Mass Index: 30.06 kg/m  Temp.: 97.73F  Pulse: 100 (Regular)  BP: 130/78(Sitting, Left Arm, Standard)       Physical Exam (Gor Vestal A. Ninfa Linden MD; 12/23/2019 3:18 PM) The physical exam findings are as follows: Note: On exam, she appears well  There are no palpable masses in her right breast. Especially in the area of the previous tumor cannot feel a mass. The nipple areolar complex is normal. There is no axillary adenopathy.    Assessment & Plan (Brinn Westby A. Ninfa Linden MD; 12/23/2019 3:19 PM) BREAST CANCER, RIGHT (C50.911) Impression: I reviewed her MRI, I again reviewed all the nodes from the cancer center.  At this point, she has had a complete pathologic response with chemotherapy. She is now a candidate for breast conservation. We discussed proceeding with a radioactive seed guided right breast lumpectomy and sentinel node biopsy. I discussed the surgical procedure in detail. I  discussed the risks which includes but is not limited to bleeding, infection, the need for further surgery if malignancy is still found, the need for further procedures, cardiopulmonary issues, postoperative recovery, etc. She understands and wished to proceed with surgery which will be scheduled as soon as possible.

## 2020-01-07 NOTE — Progress Notes (Signed)
Anesthesia Chart Review:  Case: [id number] Date/Time: 01/08/20 1545   Procedure: RIGHT BREAST LUMPECTOMY WITH RADIOACTIVE SEED AND SENTINEL LYMPH NODE BIOPSY (Right Breast) - LMA, PECTORAL BLOCK   Anesthesia type: General   Pre-op diagnosis: RIGHT BREAST CANCER   Location: [facility] OR ROOM 02 / [place] [facility]   Surgeons: [name], MD      DISCUSSION: Patient is a 59 year old female scheduled for the above procedure. RSL is being placed 01/07/20 at 1:00 PM.   History includes former smoker, right breast cancer (diagnosed 07/2019, s/p chemo, last ~ 12/06/19), Port-a-cath (left SCV 08/06/19), hysterectomy (with BSO 03/07/11). 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    Last visit with oncology 12/11/19. Chemo stopped 3 cycles early due to worsening numbness in her right 4th/5th fingers. 1 Unit PRBC ordered for HGB 7.8. She was advised to proceed with breast surgery. Preoperative HGB up to 11.8.   2nd [place] COVID-19 vaccine 08/03/19.  01/05/2020 presurgical COVID-19 test negative.  Anesthesia team to evaluate on the day of surgery.    VS: BP (!) 144/81   Pulse 75   Temp 36.6 C (Oral)   Resp 18   Ht 5\' 10"  (1.778 m)   Wt 91.7 kg   LMP 05/09/2011   SpO2 99%   BMI 29.00 kg/m    PROVIDERS: [name] Magrinat, Sarajane Jews, MD is Baker Pierini, MD is RAD-ONC   LABS: Labs reviewed: Acceptable for surgery. (all labs ordered are listed, but only abnormal results are displayed)  Labs Reviewed  CBC - Abnormal; Notable for the following components:       Result Value   RBC 3.59 (*)    Hemoglobin 11.8 (*)    MCV 101.7 (*)    All other components within normal limits  BASIC METABOLIC PANEL    IMAGES: 1V CXR 08/06/19: IMPRESSION: Port-A-Cath tip at cavoatrial junction. No pneumothorax. Lungs clear. Cardiac silhouette within normal limits.   EKG: N/A   CV: Echo 08/05/19 (pre-chemo): IMPRESSIONS  1. Left ventricular ejection fraction, by estimation, is 60 to 65%. The  left ventricle has normal function. Left ventricular endocardial border  not optimally defined to evaluate regional wall motion. Left ventricular  diastolic parameters were normal.  2. Right ventricular systolic function is normal. The right ventricular  size is normal. Tricuspid regurgitation signal is inadequate for assessing  PA pressure.  3. The mitral valve is normal in structure. Trivial mitral valve  regurgitation. No evidence of mitral stenosis.  4. The aortic valve is tricuspid. Aortic valve regurgitation is not  visualized. No aortic stenosis is present.  5. The inferior vena cava is normal in size with greater than 50%  respiratory variability, suggesting right atrial pressure of 3 mmHg.    She was admitted to [place] 07/17/08-07/21/08 (see [facility]) for chest pain and had troponins ranging from 0.03-0.08. According to Discharge Summary, "She was seen by Dr. Lacinda Axon. Patient underwent cardiac catheterization which was normal. An echocardiogram was obtained showing 60% ejection fraction with normal left ventricular systolic function."    Past Medical History:  Diagnosis Date  .  Cancer [facility])    right breast  . Family history of adverse reaction to anesthesia    Post-op N/V  . Family history of breast cancer   . Family history of colon cancer   . Family history of nonmelanoma skin cancer     Past Surgical History:  Procedure Laterality Date  . ABDOMINAL HYSTERECTOMY    . LUNG BIOPSY     Per Care Everywhere: RUL  lung biopsy 05/02/17 showed necrotizing granulomas  . PORTACATH PLACEMENT Left 08/06/2019   Procedure: INSERTION PORT-A-CATH WITH ULTRASOUND GUIDANCE;  Surgeon: [name], MD;  Location: [place];  Service: General;  Laterality: Left;    MEDICATIONS: . Multiple Vitamin (MULTI-VITAMIN DAILY PO)  . phenazopyridine (PYRIDIUM) 200 MG tablet  . traMADol (ULTRAM) 50 MG tablet   No current facility-administered medications for this encounter.    Myra Gianotti, PA-C Surgical Short Stay/Anesthesiology [facility] Phone [phone] [facility] Phone [phone] 01/07/2020 11:53 AM

## 2020-01-08 ENCOUNTER — Ambulatory Visit (HOSPITAL_COMMUNITY)
Admission: RE | Admit: 2020-01-08 | Discharge: 2020-01-08 | Disposition: A | Discharge status: Home / Self Care | Payer: BC Managed Care – PPO | Attending: Surgery | Admitting: Surgery

## 2020-01-08 ENCOUNTER — Ambulatory Visit
Admission: RE | Admit: 2020-01-08 | Discharge: 2020-01-08 | Disposition: A | Discharge status: Home / Self Care | Payer: BC Managed Care – PPO | Source: Ambulatory Visit | Attending: Surgery | Admitting: Surgery

## 2020-01-08 ENCOUNTER — Encounter (HOSPITAL_COMMUNITY)
Admission: RE | Disposition: A | Discharge status: Home / Self Care | Payer: Self-pay | Source: Home / Self Care | Attending: Surgery

## 2020-01-08 ENCOUNTER — Ambulatory Visit (HOSPITAL_COMMUNITY): Payer: BC Managed Care – PPO | Admitting: Certified Registered Nurse Anesthetist

## 2020-01-08 ENCOUNTER — Encounter (HOSPITAL_COMMUNITY)
Admission: RE | Admit: 2020-01-08 | Discharge: 2020-01-08 | Disposition: A | Discharge status: Home / Self Care | Payer: BC Managed Care – PPO | Source: Ambulatory Visit | Attending: Surgery | Admitting: Surgery

## 2020-01-08 ENCOUNTER — Other Ambulatory Visit: Payer: Self-pay

## 2020-01-08 ENCOUNTER — Ambulatory Visit (HOSPITAL_COMMUNITY): Payer: BC Managed Care – PPO | Admitting: Vascular Surgery

## 2020-01-08 ENCOUNTER — Encounter (HOSPITAL_COMMUNITY): Payer: Self-pay | Admitting: Surgery

## 2020-01-08 DIAGNOSIS — Z87891 Personal history of nicotine dependence: Secondary | ICD-10-CM | POA: Insufficient documentation

## 2020-01-08 DIAGNOSIS — C50911 Malignant neoplasm of unspecified site of right female breast: Secondary | ICD-10-CM | POA: Insufficient documentation

## 2020-01-08 DIAGNOSIS — Z9221 Personal history of antineoplastic chemotherapy: Secondary | ICD-10-CM | POA: Insufficient documentation

## 2020-01-08 DIAGNOSIS — Z853 Personal history of malignant neoplasm of breast: Secondary | ICD-10-CM

## 2020-01-08 DIAGNOSIS — D649 Anemia, unspecified: Secondary | ICD-10-CM | POA: Insufficient documentation

## 2020-01-08 HISTORY — PX: BREAST LUMPECTOMY: SHX2

## 2020-01-08 HISTORY — PX: BREAST LUMPECTOMY WITH RADIOACTIVE SEED AND SENTINEL LYMPH NODE BIOPSY: SHX6550

## 2020-01-08 SURGERY — BREAST LUMPECTOMY WITH RADIOACTIVE SEED AND SENTINEL LYMPH NODE BIOPSY
Anesthesia: Regional | Site: Breast | Laterality: Right

## 2020-01-08 MED ORDER — FENTANYL CITRATE (PF) 250 MCG/5ML IJ SOLN
INTRAMUSCULAR | Status: DC | PRN
  Administered 2020-01-08: 50 ug via INTRAVENOUS

## 2020-01-08 MED ORDER — MIDAZOLAM HCL 2 MG/2ML IJ SOLN: 2.0000 mg | Freq: Once | INTRAMUSCULAR | Status: AC

## 2020-01-08 MED ORDER — BUPIVACAINE LIPOSOME 1.3 % IJ SUSP
INTRAMUSCULAR | Status: DC | PRN
  Administered 2020-01-08: 10 mL

## 2020-01-08 MED ORDER — FENTANYL CITRATE (PF) 100 MCG/2ML IJ SOLN
INTRAMUSCULAR | Status: AC
  Administered 2020-01-08: 100 ug via INTRAVENOUS
  Filled 2020-01-08: qty 2

## 2020-01-08 MED ORDER — GABAPENTIN 300 MG PO CAPS
300.0000 mg | ORAL_CAPSULE | ORAL | Status: AC
  Administered 2020-01-08: 300 mg via ORAL
  Filled 2020-01-08: qty 1

## 2020-01-08 MED ORDER — FENTANYL CITRATE (PF) 100 MCG/2ML IJ SOLN: 100.0000 ug | Freq: Once | INTRAMUSCULAR | Status: AC

## 2020-01-08 MED ORDER — SCOPOLAMINE 1 MG/3DAYS TD PT72
MEDICATED_PATCH | TRANSDERMAL | Status: DC | PRN
  Administered 2020-01-08: 1 via TRANSDERMAL

## 2020-01-08 MED ORDER — CHLORHEXIDINE GLUCONATE 0.12 % MT SOLN
15.0000 mL | Freq: Once | OROMUCOSAL | Status: AC
  Administered 2020-01-08: 15 mL via OROMUCOSAL
  Filled 2020-01-08: qty 15

## 2020-01-08 MED ORDER — BUPIVACAINE HCL (PF) 0.5 % IJ SOLN
INTRAMUSCULAR | Status: DC | PRN
  Administered 2020-01-08: 20 mL

## 2020-01-08 MED ORDER — SODIUM CHLORIDE (PF) 0.9 % IJ SOLN
INTRAMUSCULAR | Status: AC
  Filled 2020-01-08: qty 10

## 2020-01-08 MED ORDER — LACTATED RINGERS IV SOLN: INTRAVENOUS | Status: DC

## 2020-01-08 MED ORDER — PROPOFOL 10 MG/ML IV BOLUS
INTRAVENOUS | Status: DC | PRN
  Administered 2020-01-08: 180 mg via INTRAVENOUS

## 2020-01-08 MED ORDER — PROPOFOL 10 MG/ML IV BOLUS
INTRAVENOUS | Status: AC
  Filled 2020-01-08: qty 20

## 2020-01-08 MED ORDER — PROMETHAZINE HCL 25 MG/ML IJ SOLN: 6.2500 mg | INTRAMUSCULAR | Status: DC | PRN

## 2020-01-08 MED ORDER — CEFAZOLIN SODIUM-DEXTROSE 2-4 GM/100ML-% IV SOLN
2.0000 g | INTRAVENOUS | Status: AC
  Administered 2020-01-08: 2 g via INTRAVENOUS
  Filled 2020-01-08: qty 100

## 2020-01-08 MED ORDER — ONDANSETRON HCL 4 MG/2ML IJ SOLN
INTRAMUSCULAR | Status: DC | PRN
  Administered 2020-01-08: 4 mg via INTRAVENOUS

## 2020-01-08 MED ORDER — TECHNETIUM TC 99M SULFUR COLLOID FILTERED
1.0000 | Freq: Once | INTRAVENOUS | Status: AC | PRN
  Administered 2020-01-08: 1 via INTRADERMAL

## 2020-01-08 MED ORDER — ACETAMINOPHEN 500 MG PO TABS
1000.0000 mg | ORAL_TABLET | ORAL | Status: AC
  Administered 2020-01-08: 1000 mg via ORAL
  Filled 2020-01-08: qty 2

## 2020-01-08 MED ORDER — DEXAMETHASONE SODIUM PHOSPHATE 10 MG/ML IJ SOLN
INTRAMUSCULAR | Status: DC | PRN
  Administered 2020-01-08: 10 mg via INTRAVENOUS

## 2020-01-08 MED ORDER — FENTANYL CITRATE (PF) 250 MCG/5ML IJ SOLN
INTRAMUSCULAR | Status: AC
Start: 2020-01-08 — End: ?
  Filled 2020-01-08: qty 5

## 2020-01-08 MED ORDER — LIDOCAINE HCL (CARDIAC) PF 100 MG/5ML IV SOSY
PREFILLED_SYRINGE | INTRAVENOUS | Status: DC | PRN
  Administered 2020-01-08: 60 mg via INTRATRACHEAL

## 2020-01-08 MED ORDER — STERILE WATER FOR IRRIGATION IR SOLN
Status: DC | PRN
  Administered 2020-01-08: 1000 mL

## 2020-01-08 MED ORDER — FENTANYL CITRATE (PF) 100 MCG/2ML IJ SOLN
INTRAMUSCULAR | Status: AC
  Filled 2020-01-08: qty 2

## 2020-01-08 MED ORDER — ORAL CARE MOUTH RINSE: 15.0000 mL | Freq: Once | OROMUCOSAL | Status: AC

## 2020-01-08 MED ORDER — FENTANYL CITRATE (PF) 100 MCG/2ML IJ SOLN
25.0000 ug | INTRAMUSCULAR | Status: DC | PRN
  Administered 2020-01-08: 50 ug via INTRAVENOUS

## 2020-01-08 MED ORDER — CELECOXIB 200 MG PO CAPS
400.0000 mg | ORAL_CAPSULE | ORAL | Status: AC
  Administered 2020-01-08: 400 mg via ORAL
  Filled 2020-01-08: qty 2

## 2020-01-08 MED ORDER — METHYLENE BLUE 0.5 % INJ SOLN
INTRAVENOUS | Status: AC
  Filled 2020-01-08: qty 10

## 2020-01-08 MED ORDER — TRAMADOL HCL 50 MG PO TABS: 50.0000 mg | ORAL_TABLET | Freq: Four times a day (QID) | ORAL | 0 refills | Status: DC | PRN

## 2020-01-08 MED ORDER — MIDAZOLAM HCL 2 MG/2ML IJ SOLN
INTRAMUSCULAR | Status: AC
  Administered 2020-01-08: 2 mg via INTRAVENOUS
  Filled 2020-01-08: qty 2

## 2020-01-08 MED ORDER — BUPIVACAINE HCL (PF) 0.25 % IJ SOLN
INTRAMUSCULAR | Status: DC | PRN
  Administered 2020-01-08: 20 mL

## 2020-01-08 MED ORDER — 0.9 % SODIUM CHLORIDE (POUR BTL) OPTIME
TOPICAL | Status: DC | PRN
  Administered 2020-01-08: 1000 mL

## 2020-01-08 MED ORDER — BUPIVACAINE HCL (PF) 0.25 % IJ SOLN
INTRAMUSCULAR | Status: AC
  Filled 2020-01-08: qty 30

## 2020-01-08 MED ORDER — PROPOFOL 1000 MG/100ML IV EMUL
INTRAVENOUS | Status: AC
  Filled 2020-01-08: qty 100

## 2020-01-08 MED ORDER — CHLORHEXIDINE GLUCONATE CLOTH 2 % EX PADS: 6.0000 | MEDICATED_PAD | Freq: Once | CUTANEOUS | Status: DC

## 2020-01-08 MED ORDER — FENTANYL CITRATE (PF) 100 MCG/2ML IJ SOLN: 25.0000 ug | INTRAMUSCULAR | Status: DC | PRN

## 2020-01-08 MED ORDER — ENSURE PRE-SURGERY PO LIQD: 296.0000 mL | Freq: Once | ORAL | Status: DC

## 2020-01-08 MED ORDER — MIDAZOLAM HCL 2 MG/2ML IJ SOLN
INTRAMUSCULAR | Status: AC
  Filled 2020-01-08: qty 2

## 2020-01-08 SURGICAL SUPPLY — 37 items
BINDER BREAST XLRG (GAUZE/BANDAGES/DRESSINGS) ×2 IMPLANT
CANISTER SUCT 3000ML PPV (MISCELLANEOUS) ×2 IMPLANT
COVER PROBE W GEL 5X96 (DRAPES) ×2 IMPLANT
COVER SURGICAL LIGHT HANDLE (MISCELLANEOUS) ×2 IMPLANT
DERMABOND ADVANCED (GAUZE/BANDAGES/DRESSINGS) ×1
DERMABOND ADVANCED .7 DNX12 (GAUZE/BANDAGES/DRESSINGS) ×1 IMPLANT
DEVICE DUBIN SPECIMEN MAMMOGRA (MISCELLANEOUS) ×2 IMPLANT
DRAPE CHEST BREAST 15X10 FENES (DRAPES) ×2 IMPLANT
DURAPREP 26ML APPLICATOR (WOUND CARE) ×2 IMPLANT
ELECT CAUTERY BLADE 6.4 (BLADE) ×2 IMPLANT
ELECT REM PT RETURN 9FT ADLT (ELECTROSURGICAL) ×2
ELECTRODE REM PT RTRN 9FT ADLT (ELECTROSURGICAL) ×1 IMPLANT
GLOVE BIO SURGEON STRL SZ 6 (GLOVE) ×2 IMPLANT
GLOVE BIOGEL PI IND STRL 6 (GLOVE) ×1 IMPLANT
GLOVE BIOGEL PI IND STRL 6.5 (GLOVE) ×1 IMPLANT
GLOVE BIOGEL PI IND STRL 7.0 (GLOVE) ×1 IMPLANT
GLOVE BIOGEL PI INDICATOR 6 (GLOVE) ×1
GLOVE BIOGEL PI INDICATOR 6.5 (GLOVE) ×1
GLOVE BIOGEL PI INDICATOR 7.0 (GLOVE) ×1
GLOVE ECLIPSE 7.0 STRL STRAW (GLOVE) ×2 IMPLANT
GLOVE SURG SIGNA 7.5 PF LTX (GLOVE) ×2 IMPLANT
GOWN STRL REUS W/ TWL LRG LVL3 (GOWN DISPOSABLE) ×1 IMPLANT
GOWN STRL REUS W/ TWL XL LVL3 (GOWN DISPOSABLE) ×1 IMPLANT
GOWN STRL REUS W/TWL LRG LVL3 (GOWN DISPOSABLE) ×1
GOWN STRL REUS W/TWL XL LVL3 (GOWN DISPOSABLE) ×1
KIT BASIN OR (CUSTOM PROCEDURE TRAY) ×2 IMPLANT
KIT MARKER MARGIN INK (KITS) ×2 IMPLANT
NEEDLE HYPO 25GX1X1/2 BEV (NEEDLE) ×2 IMPLANT
NS IRRIG 1000ML POUR BTL (IV SOLUTION) ×2 IMPLANT
PACK GENERAL/GYN (CUSTOM PROCEDURE TRAY) ×2 IMPLANT
PAD ABD 8X10 STRL (GAUZE/BANDAGES/DRESSINGS) ×2 IMPLANT
SUT MNCRL AB 4-0 PS2 18 (SUTURE) ×2 IMPLANT
SUT VIC AB 3-0 SH 18 (SUTURE) ×2 IMPLANT
SYR CONTROL 10ML LL (SYRINGE) ×2 IMPLANT
TOWEL GREEN STERILE (TOWEL DISPOSABLE) ×2 IMPLANT
TOWEL GREEN STERILE FF (TOWEL DISPOSABLE) ×2 IMPLANT
WATER STERILE IRR 1000ML POUR (IV SOLUTION) ×2 IMPLANT

## 2020-01-08 NOTE — Op Note (Signed)
   Debra [name] 01/08/2020   Pre-op Diagnosis: RIGHT BREAST CANCER     Post-op Diagnosis: same  Procedure(s): RIGHT BREAST LUMPECTOMY WITH RADIOACTIVE SEED AND DEEP AXILLARY SENTINEL LYMPH NODE BIOPSY  Surgeon(s): [name], MD  Anesthesia: General  Staff:  Circulator: Rozell Searing, RN Relief Scrub: Celene Squibb, RN Scrub Person: Zannie Kehr Circulator Assistant: Celene Squibb, RN  Estimated Blood Loss: Minimal               Specimens: sent to path  Indications: This is a 59 year old female who presented earlier this year with a large right breast mass.  She underwent neoadjuvant chemotherapy and follow-up MRI showed complete pathologic response.  The decision was now made to proceed with a radioactive seed guided lumpectomy and sentinel lymph node biopsy on the right breast  Procedure: The patient was brought to the operating room identifies correct patient.  She is placed upon the operating table general anesthesia was induced.  Her right breast was then prepped and draped in usual sterile fashion.  Using neoprobe identified the radioactive seed in the upper inner quadrant of the right breast near the areola.  I anesthetized skin at the edge areola with Marcaine.  I then made incision with a scalpel.  I then dissected into the breast tissue with electrocautery.  I then performed a wide lumpectomy staying around the radioactive seed with aid of the neoprobe.  The previous tumor had been 5 cm x 4 cm x 4 cm in size so decision was made to take a similar amount of tissue despite the pathologic spots.  I took this all the way down to the chest wall with electrocautery and then completed a lumpectomy removing a generous portion of tissue in the upper outer quadrant next to the areola.  I then marked the margins with marker paint.  An x-ray was performed confirming that the tissue marker and radioactive seed were in the specimen.  This was then sent to pathology for  evaluation.  I achieved hemostasis in the incision with the cautery.  I placed surgical clips in the lumpectomy cavity.  These were for marker purposes.  I anesthetized the incision further. Next I identified an area of increased radioactive uptake in the right axilla with the neoprobe.  I made incision with a scalpel and then dissected in the deep right axillary tissue.  I was able to identify what appeared to be 2 sentinel lymph nodes which appear to be grouped together.  I excised these with the cautery and sent them to pathology.  There was no other increased uptake of radioactive isotope in the axilla.  I palpated no other enlarged lymph nodes.  At this point hemostasis appeared to be achieved.  I next closed both incisions with interrupted 3-0 Vicryl sutures and running 4-0 Monocryl.  Dermabond was then applied.  The patient tolerated procedure well.  All counts were correct at the end of the procedure.  The patient was placed in a breast binder.  She was extubated in the operating room and taken in a stable condition to the recovery room.          [name]   Date: 01/08/2020  Time: 5:26 PM

## 2020-01-08 NOTE — Anesthesia Procedure Notes (Signed)
Procedure Name: LMA Insertion Date/Time: 01/08/2020 4:45 PM Performed by: Josephine Igo, CRNA Pre-anesthesia Checklist: Patient identified, Emergency Drugs available, Suction available and Patient being monitored Patient Re-evaluated:Patient Re-evaluated prior to induction Oxygen Delivery Method: Circle system utilized Preoxygenation: Pre-oxygenation with 100% oxygen Induction Type: IV induction Ventilation: Mask ventilation without difficulty LMA: LMA inserted LMA Size: 4.0 Placement Confirmation: positive ETCO2 Dental Injury: Teeth and Oropharynx as per pre-operative assessment

## 2020-01-08 NOTE — Transfer of Care (Signed)
Immediate Anesthesia Transfer of Care Note  Patient: Debra [name]  Procedure(s) Performed: RIGHT BREAST LUMPECTOMY WITH RADIOACTIVE SEED AND SENTINEL LYMPH NODE BIOPSY (Right Breast)  Patient Location: PACU  Anesthesia Type:General and GA combined with regional for post-op pain  Level of Consciousness: awake and alert   Airway & Oxygen Therapy: Patient Spontanous Breathing and Patient connected to nasal cannula oxygen  Post-op Assessment: Report given to RN and Post -op Vital signs reviewed and stable  Post vital signs: Reviewed and stable  Last Vitals:  Vitals Value Taken Time  BP 141/91 01/08/20 1739  Temp    Pulse 71 01/08/20 1739  Resp 19 01/08/20 1739  SpO2 100 % 01/08/20 1739  Vitals shown include unvalidated device data.  Last Pain:  Vitals:   01/08/20 1535  TempSrc:   PainSc: 0-No pain      Patients Stated Pain Goal: 3 (67/20/94 7096)  Complications: No complications documented.

## 2020-01-08 NOTE — Interval H&P Note (Signed)
History and Physical Interval Note: no change in H and P  01/08/2020 3:08 PM  Debra [name]  has presented today for surgery, with the diagnosis of RIGHT BREAST CANCER.  The various methods of treatment have been discussed with the patient and family. After consideration of risks, benefits and other options for treatment, the patient has consented to  Procedure(s) with comments: RIGHT BREAST LUMPECTOMY WITH RADIOACTIVE SEED AND SENTINEL LYMPH NODE BIOPSY (Right) - LMA, PECTORAL BLOCK as a surgical intervention.  The patient's history has been reviewed, patient examined, no change in status, stable for surgery.  I have reviewed the patient's chart and labs.  Questions were answered to the patient's satisfaction.     [name]

## 2020-01-08 NOTE — Discharge Instructions (Signed)
[facility] °Office Phone Number [phone] ° °BREAST BIOPSY/ PARTIAL MASTECTOMY: POST OP INSTRUCTIONS ° °Always review your discharge instruction sheet given to you by the facility where your surgery was performed. ° °IF YOU HAVE DISABILITY OR FAMILY LEAVE FORMS, YOU MUST BRING THEM TO THE OFFICE FOR PROCESSING.  DO NOT GIVE THEM TO YOUR DOCTOR. ° °1. A prescription for pain medication may be given to you upon discharge.  Take your pain medication as prescribed, if needed.  If narcotic pain medicine is not needed, then you may take acetaminophen (Tylenol) or ibuprofen (Advil) as needed. °2. Take your usually prescribed medications unless otherwise directed °3. If you need a refill on your pain medication, please contact your pharmacy.  They will contact our office to request authorization.  Prescriptions will not be filled after 5pm or on week-ends. °4. You should eat very light the first 24 hours after surgery, such as soup, crackers, pudding, etc.  Resume your normal diet the day after surgery. °5. Most patients will experience some swelling and bruising in the breast.  Ice packs and a good support bra will help.  Swelling and bruising can take several days to resolve.  °6. It is common to experience some constipation if taking pain medication after surgery.  Increasing fluid intake and taking a stool softener will usually help or prevent this problem from occurring.  A mild laxative (Milk of Magnesia or Miralax) should be taken according to package directions if there are no bowel movements after 48 hours. °7. Unless discharge instructions indicate otherwise, you may remove your bandages 24-48 hours after surgery, and you may shower at that time.  You may have steri-strips (small skin tapes) in place directly over the incision.  These strips should be left on the skin for 7-10 days.  If your surgeon used skin glue on the incision, you may shower in 24 hours.  The glue will flake off over the  next 2-3 weeks.  Any sutures or staples will be removed at the office during your follow-up visit. °8. ACTIVITIES:  You may resume regular daily activities (gradually increasing) beginning the next day.  Wearing a good support bra or sports bra minimizes pain and swelling.  You may have sexual intercourse when it is comfortable. °a. You may drive when you no longer are taking prescription pain medication, you can comfortably wear a seatbelt, and you can safely maneuver your car and apply brakes. °b. RETURN TO WORK:  ______________________________________________________________________________________ °9. You should see your doctor in the office for a follow-up appointment approximately two weeks after your surgery.  Your doctor’s nurse will typically make your follow-up appointment when she calls you with your pathology report.  Expect your pathology report 2-3 business days after your surgery.  You may call to check if you do not hear from us after three days. °10. OTHER INSTRUCTIONS:OK TO REMOVE THE BINDER AND SHOWER STARTING TOMORROW °11. ICE PACK, TYLENOL, AND IBUPROFEN ALSO FOR PAIN °12. NO VIGOROUS ACTIVITY FOR ONE WEEK _______________________________________________________________________________________________ _____________________________________________________________________________________________________________________________________ °_____________________________________________________________________________________________________________________________________ °_____________________________________________________________________________________________________________________________________ ° °WHEN TO CALL YOUR DOCTOR: °1. Fever over 101.0 °2. Nausea and/or vomiting. °3. Extreme swelling or bruising. °4. Continued bleeding from incision. °5. Increased pain, redness, or drainage from the incision. ° °The clinic staff is available to answer your questions during regular business hours.   Please don’t hesitate to call and ask to speak to one of the nurses for clinical concerns.  If you have a medical emergency, go to the nearest emergency room or   call 911.  A surgeon from [facility] is always on call at the hospital. ° °For further questions, please visit [email or web address]  °

## 2020-01-08 NOTE — Anesthesia Procedure Notes (Signed)
Anesthesia Regional Block: Pectoralis block   Pre-Anesthetic Checklist: ,, timeout performed, Correct Patient, Correct Site, Correct Laterality, Correct Procedure, Correct Position, site marked, Risks and benefits discussed,  Surgical consent,  Pre-op evaluation,  At surgeon's request and post-op pain management  Laterality: Right  Prep: chloraprep       Needles:  Injection technique: Single-shot  Needle Type: Stimiplex     Needle Length: 9cm  Needle Gauge: 20     Additional Needles:   Procedures:,,,, ultrasound used (permanent image in chart),,,,  Narrative:  Start time: 01/08/2020 3:20 PM End time: 01/08/2020 3:30 PM Injection made incrementally with aspirations every 5 mL.  Performed by: Personally  Anesthesiologist: Merlinda Frederick, MD

## 2020-01-08 NOTE — Progress Notes (Signed)
David from [facility] in room.

## 2020-01-09 ENCOUNTER — Encounter (HOSPITAL_COMMUNITY): Payer: Self-pay | Admitting: Surgery

## 2020-01-13 ENCOUNTER — Other Ambulatory Visit: Payer: Self-pay

## 2020-01-13 NOTE — Anesthesia Postprocedure Evaluation (Signed)
Anesthesia Post Note  Patient: Debra [name]  Procedure(s) Performed: RIGHT BREAST LUMPECTOMY WITH RADIOACTIVE SEED AND SENTINEL LYMPH NODE BIOPSY (Right Breast)     Patient location during evaluation: PACU Anesthesia Type: Regional and General Level of consciousness: awake and alert Pain management: pain level controlled Vital Signs Assessment: post-procedure vital signs reviewed and stable Respiratory status: spontaneous breathing, nonlabored ventilation, respiratory function stable and patient connected to nasal cannula oxygen Cardiovascular status: blood pressure returned to baseline and stable Postop Assessment: no apparent nausea or vomiting Anesthetic complications: no   No complications documented.  Last Vitals:  Vitals:   01/08/20 1755 01/08/20 1810  BP: 134/67 (!) 150/76  Pulse: 66 70  Resp: 14 12  Temp:    SpO2: 99% 99%    Last Pain:  Vitals:   01/08/20 1810  TempSrc:   PainSc: Asleep                 [name]

## 2020-01-14 ENCOUNTER — Encounter: Payer: Self-pay | Admitting: *Deleted

## 2020-01-14 ENCOUNTER — Inpatient Hospital Stay: Payer: BC Managed Care – PPO

## 2020-01-14 ENCOUNTER — Inpatient Hospital Stay: Payer: BC Managed Care – PPO | Admitting: Adult Health

## 2020-01-15 ENCOUNTER — Telehealth: Payer: Self-pay | Admitting: *Deleted

## 2020-01-15 LAB — SURGICAL PATHOLOGY

## 2020-01-15 NOTE — Telephone Encounter (Signed)
Spoke to pt regarding next steps. Discussed pathology results. Pt excited for pCR. Discussed appt with Dr. Lisbeth Renshaw and SIM. Informed she will receive radiation treatment plan when she sees Dr. Val Riles. Discussed steps after xrt is complete. Denies further questions at this time. Encourage pt to call with needs.

## 2020-01-16 ENCOUNTER — Encounter: Payer: Self-pay | Admitting: *Deleted

## 2020-01-21 ENCOUNTER — Other Ambulatory Visit: Payer: Self-pay | Admitting: Surgery

## 2020-01-21 NOTE — Progress Notes (Signed)
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   She completed 10 cycles of weekly paclitaxel and carboplatin on 12/11/2019.  She underwent post-therapy breast MRI on 12/19/2019 showing: breast composition C; complete resolution of findings associated with patient's known malignancy; no evidence of persistent malignancy in either breast,  She proceeded to right lumpectomy on 01/08/2020 under Dr. Ninfa Linden. Pathology from the procedure [id number]) showed: no residual carcinoma; focal atypical ductal hyperplasia.  The biopsied lymph node was also negative for carcinoma (0/1).   REVIEW OF SYSTEMS: Debra [name] did well with her surgery, with minimal pain, no bleeding swelling or fever.  She is planning to get back to work 01/26/2020.  She understands that she has all the radiation to go through yet and at some point we will need to pull the port.  She is trying to exercise a mile or 2 a day.  A detailed review of systems today was otherwise stable.   HISTORY OF CURRENT ILLNESS: From the original intake note:  Karletta herself palpated a mass in the upper-inner right breast in February 2021.  It appeared to increase in size over the next 3 weeks. Physical exam performed  at [facility] 07/15/2019 confirmed a 6 mm firm, oval, palpable mass in the upper-inner right breast. She underwent bilateral diagnostic mammography with tomography and bilateral breast ultrasonography on 07/15/2019 showing: breast density category C; 4.6 cm mass in the right breast at 1 o'clock; single abnormal-appearing lymph node; bilateral benign cysts.  Accordingly on 07/21/2019 she proceeded to biopsy of the right breast area in question. 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 Prognostic indicators significant for: estrogen receptor, 60% positive with weak staining intensity and progesterone receptor, 0% negative. Proliferation marker Ki67 at 90%. HER2 equivocal by immunohistochemistry (2+), but negative by fluorescent in situ hybridization with a signals ratio 1.24 and number per cell 2.55.  The questionable right axillary lymph node was biopsied as well and was benign (concordant).  The patient's subsequent history is as detailed below.   PAST MEDICAL HISTORY: Past Medical History:  Diagnosis Date  . Cancer [facility])    right breast  . Family history of adverse reaction to anesthesia    Post-op N/V  . Family history of breast cancer   . Family history of colon cancer   . Family history of nonmelanoma skin cancer     PAST SURGICAL HISTORY: Past Surgical History:  Procedure Laterality Date  . ABDOMINAL HYSTERECTOMY    . BREAST LUMPECTOMY WITH RADIOACTIVE SEED AND SENTINEL LYMPH NODE BIOPSY Right 01/08/2020   Procedure: RIGHT BREAST LUMPECTOMY WITH RADIOACTIVE SEED AND SENTINEL LYMPH NODE BIOPSY;  Surgeon: [name], MD;  Location: [facility];  Service: General;  Laterality: Right;  LMA, PECTORAL BLOCK  . COLONOSCOPY    . 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 PORTACATH PLACEMENT Left 08/06/2019   Procedure: INSERTION  PORT-A-CATH WITH ULTRASOUND GUIDANCE;  Surgeon: [name], MD;  Location: [facility];  Service: General;  Laterality: Left;  . WISDOM TOOTH EXTRACTION    Status post bilateral salpingo-oophorectomy   FAMILY HISTORY: Family History  Problem Relation Age of Onset  . Skin cancer Sister        non-melanoma dx. in her 71s  . Other Sister        normal genetic testing for hereditary cancer risks  . Colon cancer Paternal Grandfather        dx. in his 26s  . Wilm's tumor Son        dx. 9 months  . Breast cancer Other        dx. in her 31s (PGM's sister)  . Wilm's tumor Nephew 2  . Breast cancer Other        dx. in her 29s (PGM's niece)    Her father is 34 years old as of 07/2019. Her mother was murdered at age 48 (domestic violence).  The patient has 3 sisters and no brothers. She reports a third cousin (grandmother's sister's daughter) with breast cancer in her 53's. She denies a family history of ovarian, prostate, or pancreatic cancer. She does report colon cancer in her paternal grandfather in his 58's, non-melanoma skin cancer in her sister in her 70's, and Wilms tumor in her son at 66 months.   GYNECOLOGIC HISTORY:  Patient's last menstrual period was 05/09/2011. Menarche: 76-80 years old Age at first live birth: 59 years old Luis M. Cintron P 3 LMP 2013 Contraceptive never used HRT used for approximately 9 months  Hysterectomy? Yes, 2013 BSO? yes   SOCIAL HISTORY: (updated 07/2019)  Debra [name] works as a Scientist, research (physical sciences). Husband Debra [name] is an Art gallery manager. She lives at home with husband Debra [name] Daughter Debra [name], age 59, is a poet and Pharmacist, hospital in [place], [place]. Son Debra [name], age 94, is an Production designer, theatre/television/film in [place] in [place], [place]. Son Debra [name], age 55, is a Marketing executive working in Artist in [place], [place] (at the [employer]). Jenniah has three grandchildren. She is a Media planner.    ADVANCED  DIRECTIVES: In the absence of any documentation to the contrary, the patient's spouse is their HCPOA.    HEALTH MAINTENANCE: Social History   Tobacco Use  . Smoking status: Former Smoker    Packs/day: 1.00    Years: 10.00    Pack years: 10.00    Types: Cigarettes    Quit date: 1992    Years since quitting: 29.7  . Smokeless tobacco: Never Used  Vaping Use  . Vaping Use: Never used  Substance Use Topics  . Alcohol use: Yes    Alcohol/week: 2.0 standard drinks    Types: 2 Glasses of wine per week    Comment: occassionally  . Drug use: Never     Colonoscopy: none on file  PAP: none on file (s/p hysterectomy)  Bone density: n/a (age)   Allergies  Allergen Reactions  . Other Rash    Walnuts give her a rash       Current Outpatient Medications  Medication Sig Dispense Refill  . Multiple Vitamin (MULTI-VITAMIN DAILY PO) Take 1 tablet by mouth daily.     . traMADol (ULTRAM) 50 MG tablet Take 1 tablet (50 mg total) by mouth every 6 (six) hours as needed  for moderate pain or severe pain. 25 tablet 0   No current facility-administered medications for this visit.    OBJECTIVE: White woman who appears stated age  74:   01/22/20 0838  BP: 125/68  Pulse: 76  Resp: 20  Temp: (!) 97.5 F (36.4 C)  SpO2: 100%     Body mass index is 28.94 kg/m.   Wt Readings from Last 3 Encounters:  01/22/20 201 lb 11.2 oz (91.5 kg)  01/08/20 202 lb 2.6 oz (91.7 kg)  01/07/20 202 lb 1.6 oz (91.7 kg)     ECOG FS:1 - Symptomatic but completely ambulatory  Sclerae unicteric, EOMs intact Wearing a mask No cervical or supraclavicular adenopathy Lungs no rales or rhonchi Heart regular rate and rhythm Abd soft, nontender, positive bowel sounds MSK no focal spinal tenderness, no upper extremity lymphedema Neuro: nonfocal, well oriented, appropriate affect Breasts: The right breast is status post recent lumpectomy.  The cosmetic result is excellent.  The left breast is slightly swollen  but not erythematous.  There is no dehiscence erythema or swelling.  Both axillae are benign.   LAB RESULTS:  CMP     Component Value Date/Time   NA 141 01/07/2020 1055   K 4.0 01/07/2020 1055   CL 107 01/07/2020 1055   CO2 25 01/07/2020 1055   GLUCOSE 84 01/07/2020 1055   BUN 9 01/07/2020 1055   CREATININE 0.64 01/07/2020 1055   CREATININE 0.76 07/30/2019 0825   CALCIUM 9.7 01/07/2020 1055   PROT 6.1 (L) 12/11/2019 0950   ALBUMIN 3.5 12/11/2019 0950   AST 18 12/11/2019 0950   AST 14 (L) 07/30/2019 0825   ALT 21 12/11/2019 0950   ALT 11 07/30/2019 0825   ALKPHOS 92 12/11/2019 0950   BILITOT 0.3 12/11/2019 0950   BILITOT 0.4 07/30/2019 0825   GFRNONAA >60 01/07/2020 1055   GFRNONAA >60 07/30/2019 0825   GFRAA >60 01/07/2020 1055   GFRAA >60 07/30/2019 0825    No results found for: TOTALPROTELP, ALBUMINELP, A1GS, A2GS, BETS, BETA2SER, GAMS, MSPIKE, SPEI  Lab Results  Component Value Date   WBC 6.4 01/07/2020   NEUTROABS 1.1 (L) 12/11/2019   HGB 11.8 (L) 01/07/2020   HCT 36.5 01/07/2020   MCV 101.7 (H) 01/07/2020   PLT 235 01/07/2020    No results found for: LABCA2  No components found for: [id number]  No results for input(s): INR in the last 168 hours.  No results found for: LABCA2  No results found for: TDD220  No results found for: URK270  No results found for: WCB762  No results found for: CA2729  No components found for: HGQUANT  No results found for: CEA1 / No results found for: CEA1   No results found for: AFPTUMOR  No results found for: CHROMOGRNA  No results found for: KPAFRELGTCHN, LAMBDASER, KAPLAMBRATIO (kappa/lambda light chains)  No results found for: HGBA, HGBA2QUANT, HGBFQUANT, HGBSQUAN (Hemoglobinopathy evaluation)   No results found for: LDH  No results found for: IRON, TIBC, IRONPCTSAT (Iron and TIBC)  No results found for: FERRITIN  Urinalysis    Component Value Date/Time   COLORURINE YELLOW 11/26/2019 [place] 11/26/2019 1319   LABSPEC 1.030 11/26/2019 1319   PHURINE 5.0 11/26/2019 1319   GLUCOSEU NEGATIVE 11/26/2019 1319   HGBUR NEGATIVE 11/26/2019 1319   BILIRUBINUR NEGATIVE 11/26/2019 1319   KETONESUR NEGATIVE 11/26/2019 1319   PROTEINUR NEGATIVE 11/26/2019 1319   NITRITE NEGATIVE 11/26/2019 1319   LEUKOCYTESUR TRACE (A) 11/26/2019 1319  STUDIES: NM Sentinel Node Inj-No Rpt (Breast)  Result Date: 01/08/2020 Sulfur colloid was injected by the nuclear medicine technologist for melanoma sentinel node.   MM Breast Surgical Specimen  Result Date: 01/08/2020 CLINICAL DATA:  Status post excision of the right breast following radioactive seed localization of a lesion. EXAM: SPECIMEN RADIOGRAPH OF THE RIGHT BREAST COMPARISON:  Previous exam(s). FINDINGS: Status post excision of the right breast. The radioactive seed and biopsy marker clip are present, completely intact, and were marked for pathology. IMPRESSION: Specimen radiograph of the right breast. Electronically Signed   By: Lajean Manes M.D.   On: 01/08/2020 17:18   MM RT RADIOACTIVE SEED LOC MAMMO GUIDE  Result Date: 01/07/2020 CLINICAL DATA:  59 year old female presenting for radioactive seed localization of the right breast prior to lumpectomy. EXAM: MAMMOGRAPHIC GUIDED RADIOACTIVE SEED LOCALIZATION OF THE RIGHT BREAST COMPARISON:  Previous exam(s). FINDINGS: Patient presents for radioactive seed localization prior to right breast lumpectomy. I met with the patient and we discussed the procedure of seed localization including benefits and alternatives. We discussed the high likelihood of a successful procedure. We discussed the risks of the procedure including infection, bleeding, tissue injury and further surgery. We discussed the low dose of radioactivity involved in the procedure. Informed, written consent was given. The usual time-out protocol was performed immediately prior to the procedure. Using mammographic guidance,  sterile technique, 1% lidocaine and an I-125 radioactive seed, the ribbon shaped biopsy marking clip in the upper inner right breast was localized using a superior approach. The follow-up mammogram images confirm the seed in the expected location and were marked for Dr. Ninfa Linden. Follow-up survey of the patient confirms presence of the radioactive seed. Order number of I-125 seed:  [id number]. Total activity:  4.034 millicuries reference Date: 12/08/2019 The patient tolerated the procedure well and was released from the [facility]. She was given instructions regarding seed removal. IMPRESSION: Radioactive seed localization right breast. No apparent complications. Electronically Signed   By: Ammie Ferrier M.D.   On: 01/07/2020 13:38     ELIGIBLE FOR AVAILABLE RESEARCH PROTOCOL: AET  ASSESSMENT: 59 y.o. 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), with an MIB-1 of 90%  (1) neoadjuvant chemotherapy consisting of cyclophosphamide and doxorubicin in dose dense fashion x4 started 08/07/2019, completed 09/18/2019, followed by paclitaxel and carboplatin weekly started 10/08/2019  (a) echo 08/05/2019 showed an ejection fraction in the 60-65% range  (b) paclitaxel/carboplatinum discontinued after 9 doses (12/03/2019) secondary to grade 1 peripheral neuropathy  (2) status post right lumpectomy and sentinel lymph node sampling 01/08/2020 showing a complete pathologic response (ypT0 ypN0)  (a) a single right axillary lymph node was removed  (3) adjuvant radiation to follow  (4) genetics testing 11/06/2019 through the [facility] Multi-Cancer Panel + Wilms Tumor Panel found no deleterious mutations in AIP, ALK, APC, ATM, AXIN2,BAP1,  BARD1, BLM, BMPR1A, BRCA1, BRCA2, BRIP1, CASR, CDC73, CDH1, CDK4, CDKN1B, CDKN1C, CDKN2A (p14ARF), CDKN2A (p16INK4a),  CEBPA, CHEK2, CTNNA1, DICER1, DIS3L2, EGFR (c.2369C>T, p.Thr790Met variant only), EPCAM (Deletion/duplication testing only), FH, FLCN, GATA2, GPC3, GREM1 (Promoter region deletion/duplication testing only), HOXB13 (c.251G>A, p.Gly84Glu), HRAS, KIT, MAX, MEN1, MET, MITF (c.952G>A, p.Glu318Lys variant only), MLH1, MSH2, MSH3, MSH6, MUTYH, NBN, NF1, NF2, NTHL1, PALB2, PDGFRA, PHOX2B, PMS2, POLD1, POLE, POT1, PRKAR1A, PTCH1, PTEN, RAD50, RAD51C, RAD51D, RB1, RECQL4, RET, RNF43, RUNX1, SDHAF2, SDHA (sequence changes only), SDHB, SDHC, SDHD, SMAD4, SMARCA4, SMARCB1, SMARCE1,  STK11, SUFU, TERC, TERT, TMEM127, TP53, TSC1, TSC2, VHL, WRN and WT1. The Wilms Tumor Panel offered by [facility] includes sequencing and deletion/duplication testing of the following 7 genes: CDC73, CDKN1C, CTR9, DIS3L2, GPC3, REST, and WT1.  (a) Three variants of uncertain significance were detected - one in the AXIN2 gene called c.13A>G, one in the RNF43 gene called c.1770G>C, and one in the [facility] gene called c.85C>G.    PLAN: Jenasia had the best possible response to her chemotherapy and this predicts a good long-term prognosis for her.  I am delighted.  We did discuss the path report in detail.  She understands that she had weak staining positivity for estrogen receptors, with a negative progesterone receptor in a cancer with metaplastic features.  I think the likelihood of her getting any significant response for antiestrogens is very low on those drugs certainly I can have significant side effects and complications including blood clots and osteoporosis.  I am comfortable with her not receiving those.  She is now ready for adjuvant radiation.  I anticipate she will be done with this towards the end of October or early November.  Once she is done with radiation she can have her port removed.  I am going to see her early December and at that point I will set her up for mammography sometime in February 2022 and we will institute long-term  follow-up from that point  She brought the staff some chocolates today which was very nice.  Total encounter time 25 minutes.   [name]. Manroop Jakubowicz, MD 01/22/20 8:52 AM Medical Oncology and Hematology [facility] [place], [place] [address] Tel. [phone]    Fax. [phone]   I, Wilburn Mylar, am acting as scribe for Dr. [name]. Basir Niven.  I, Lurline Del MD, have reviewed the above documentation for accuracy and completeness, and I agree with the above.    *Total Encounter Time as defined by the Centers for Medicare and Medicaid Services includes, in addition to the face-to-face time of a patient visit (documented in the note above) non-face-to-face time: obtaining and reviewing outside history, ordering and reviewing medications, tests or procedures, care coordination (communications with other health care professionals or caregivers) and documentation in the medical record.

## 2020-01-22 ENCOUNTER — Telehealth: Payer: Self-pay | Admitting: Oncology

## 2020-01-22 ENCOUNTER — Other Ambulatory Visit: Payer: Self-pay

## 2020-01-22 ENCOUNTER — Telehealth: Payer: Self-pay | Admitting: Radiation Oncology

## 2020-01-22 ENCOUNTER — Inpatient Hospital Stay: Payer: BC Managed Care – PPO | Attending: Oncology | Admitting: Oncology

## 2020-01-22 VITALS — BP 125/68 | HR 76 | Temp 97.5°F | Resp 20 | Ht 70.0 in | Wt 201.7 lb

## 2020-01-22 DIAGNOSIS — Z170 Estrogen receptor positive status [ER+]: Secondary | ICD-10-CM | POA: Diagnosis not present

## 2020-01-22 DIAGNOSIS — G629 Polyneuropathy, unspecified: Secondary | ICD-10-CM | POA: Diagnosis not present

## 2020-01-22 DIAGNOSIS — C50211 Malignant neoplasm of upper-inner quadrant of right female breast: Secondary | ICD-10-CM | POA: Diagnosis not present

## 2020-01-22 DIAGNOSIS — Z9221 Personal history of antineoplastic chemotherapy: Secondary | ICD-10-CM | POA: Insufficient documentation

## 2020-01-22 DIAGNOSIS — C773 Secondary and unspecified malignant neoplasm of axilla and upper limb lymph nodes: Secondary | ICD-10-CM | POA: Diagnosis not present

## 2020-01-22 NOTE — Telephone Encounter (Signed)
Scheduled appts per 9/16 los. Left voicemail with appt date and time.

## 2020-01-22 NOTE — Telephone Encounter (Signed)
Called patient to inform her that we had to change her 9/30 CT SIM to 4P. No answer, LVM.

## 2020-01-26 ENCOUNTER — Ambulatory Visit: Payer: BC Managed Care – PPO | Attending: Surgery | Admitting: Physical Therapy

## 2020-01-26 ENCOUNTER — Other Ambulatory Visit: Payer: Self-pay

## 2020-01-26 ENCOUNTER — Encounter: Payer: Self-pay | Admitting: Physical Therapy

## 2020-01-26 DIAGNOSIS — Z170 Estrogen receptor positive status [ER+]: Secondary | ICD-10-CM | POA: Insufficient documentation

## 2020-01-26 DIAGNOSIS — C50211 Malignant neoplasm of upper-inner quadrant of right female breast: Secondary | ICD-10-CM | POA: Diagnosis not present

## 2020-01-26 DIAGNOSIS — Z483 Aftercare following surgery for neoplasm: Secondary | ICD-10-CM | POA: Insufficient documentation

## 2020-01-26 DIAGNOSIS — R293 Abnormal posture: Secondary | ICD-10-CM | POA: Diagnosis present

## 2020-01-26 NOTE — Therapy (Signed)
[place], [place], [address] Phone: [phone]   Fax:  [phone]  Physical Therapy Treatment  Patient Details  Name: Debra [name] MRN: [id number] Date of Birth: 1961-03-16 Referring Provider (PT): Dr. [name]   Encounter Date: 01/26/2020   PT End of Session - 01/26/20 1623    Visit Number 2    Number of Visits 2    PT Start Time 2836    PT Stop Time 1623    PT Time Calculation (min) 44 min    Activity Tolerance Patient tolerated treatment well    Behavior During Therapy [facility] for tasks assessed/performed           Past Medical History:  Diagnosis Date  . Cancer [facility])    right breast  . Family history of adverse reaction to anesthesia    Post-op N/V  . Family history of breast cancer   . Family history of colon cancer   . Family history of nonmelanoma skin cancer     Past Surgical History:  Procedure Laterality Date  . ABDOMINAL HYSTERECTOMY    . BREAST LUMPECTOMY WITH RADIOACTIVE SEED AND SENTINEL LYMPH NODE BIOPSY Right 01/08/2020   Procedure: RIGHT BREAST LUMPECTOMY WITH RADIOACTIVE SEED AND SENTINEL LYMPH NODE BIOPSY;  Surgeon: [name], MD;  Location: [place];  Service: General;  Laterality: Right;  LMA, PECTORAL BLOCK  . COLONOSCOPY    . 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 PORTACATH PLACEMENT Left 08/06/2019   Procedure: INSERTION PORT-A-CATH WITH ULTRASOUND GUIDANCE;  Surgeon: [name], MD;  Location: [place];  Service: General;  Laterality: Left;  . WISDOM TOOTH EXTRACTION      There were no vitals filed for this visit.   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 She underwent a right lumpectomy and sentinel node biopsy (1 negative node)  on 01/08/2020. She will begin radiation with simulation on 02/04/2020.    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 Patient underwent neoadjuvant chemotherapy from 08/07/2019-12/03/2019. She had a right lumpectomy and sentinel node biopsy (1 negative node) on 01/08/2020.    Patient Stated Goals See how my arm is doing    Currently in Pain? Yes    Pain Score 5     Pain Location Axilla    Pain Orientation Right    Pain Descriptors / Indicators Nagging    Pain Type Surgical pain    Pain Onset 1 to 4 weeks ago    Pain Frequency Intermittent    Aggravating Factors  using my arm    Pain Relieving Factors ice              [facility] PT Assessment - 01/26/20 0001      Assessment   Medical Diagnosis s/p right lumpectomy and SLNB    Referring Provider (PT) Dr. [name]    Onset Date/Surgical Date 01/08/20    Hand Dominance Right    Prior Therapy Baselines      Precautions   Precautions Other (comment)    Precaution Comments recent surgery      Restrictions   Weight Bearing Restrictions No      Balance Screen   Has the patient fallen in the past 6 months  No    Has the patient had a decrease in activity level because of a fear of falling?  No    Is the patient reluctant to leave their home because of a fear of falling?  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 Mild edema  present at incision sites. No signs of infection.      AROM   AROM Assessment Site Shoulder    Right/Left Shoulder Right    Right Shoulder Extension 48 Degrees    Right Shoulder Flexion 138 Degrees    Right Shoulder ABduction 135 Degrees    Right Shoulder Internal Rotation 65 Degrees    Right Shoulder External Rotation 74 Degrees             LYMPHEDEMA/ONCOLOGY QUESTIONNAIRE - 01/26/20 0001      Type   Cancer Type Right breast cancer      Surgeries   Lumpectomy Date 01/08/20    Sentinel Lymph Node Biopsy Date 01/08/20    Number Lymph Nodes Removed 1      Treatment   Active Chemotherapy Treatment No    Past Chemotherapy Treatment Yes    Date 12/03/19    Active Radiation Treatment No    Past Radiation Treatment No    Current Hormone Treatment No    Past Hormone Therapy No      What other symptoms do you have   Are you Having Heaviness or Tightness Yes    Are you having Pain Yes    Are you having pitting edema No    Is it Hard or Difficult finding clothes that fit No    Do you have infections No    Is there Decreased scar mobility Yes    Stemmer Sign No      Lymphedema Assessments   Lymphedema Assessments Upper extremities      Right Upper Extremity Lymphedema   10 cm Proximal to Olecranon Process 34.7 cm    Olecranon Process 28.7 cm    10 cm Proximal to Ulnar Styloid Process 24.5 cm    Just Proximal to Ulnar Styloid Process 17.2 cm    Across Hand at [employer] 18.7 cm    At [place] of 2nd Digit 6.3 cm      Left Upper Extremity Lymphedema   10 cm Proximal to Olecranon Process 34.8 cm    Olecranon Process 29.3 cm    10 cm Proximal to Ulnar Styloid Process 24.2 cm    Just Proximal to Ulnar Styloid Process 17.2 cm    Across Hand at [employer] 18.7 cm    At [place] of 2nd Digit 6.2 cm              Quick Dash - 01/26/20 0001    Open a tight or new jar No difficulty    Do heavy household chores (wash walls, wash floors) No difficulty  Carry a  shopping bag or briefcase No difficulty    Wash your back No difficulty    Use a knife to cut food No difficulty    Recreational activities in which you take some force or impact through your arm, shoulder, or hand (golf, hammering, tennis) Unable    During the past week, to what extent has your arm, shoulder or hand problem interfered with your normal social activities with family, friends, neighbors, or groups? Not at all    During the past week, to what extent has your arm, shoulder or hand problem limited your work or other regular daily activities Not at all    Arm, shoulder, or hand pain. Moderate    Tingling (pins and needles) in your arm, shoulder, or hand Moderate    Difficulty Sleeping Mild difficulty    DASH Score 20.45 %                          PT Education - 01/26/20 1623    Education Details Lymphedema risk reduction and HEP    Person(s) Educated Patient    Methods Explanation;Demonstration;Handout    Comprehension Returned demonstration;Verbalized understanding               PT Long Term Goals - 01/26/20 1625      PT LONG TERM GOAL #1   Title Patient will demonstrate she has regained full shoulder ROM and function post operatively compared to baselines.    Time 8    Period Weeks    Status Achieved                 Plan - 01/26/20 1624    Clinical Impression Statement Patient is healing very well s/p right lumpectomy and sentinel node biopsy. She has regained shoulder ROM, has no sign of lymphedema, and incisions are healing normally. Mild edema present in right breast which appears to be normal post surgical swelling. No need for further PT.    PT Next Visit Plan Will do L-Dex screenings every 3 months    PT Home Exercise Plan Post op shoulder ROM HEP    Consulted and Agree with Plan of Care Patient           Patient will benefit from skilled therapeutic intervention in order to improve the following deficits and impairments:   Postural dysfunction, Decreased range of motion, Decreased knowledge of precautions, Impaired UE functional use, Pain  Visit Diagnosis: Malignant neoplasm of upper-inner quadrant of right breast in female, estrogen receptor positive (HCC)  Abnormal posture  Aftercare following surgery for neoplasm     Problem List Patient Active Problem List   Diagnosis Date Noted  . Genetic testing 11/07/2019  . Family history of breast cancer   . Family history of colon cancer   . Family history of nonmelanoma skin cancer   . Chemotherapy induced neutropenia ([place]) 08/15/2019  . Malignant neoplasm of upper-inner quadrant of right breast in female, estrogen receptor positive ([place]) 07/23/2019   PHYSICAL THERAPY DISCHARGE SUMMARY  Visits from Start of Care: 2  Current functional level related to goals / functional outcomes: Goals met. See above for objective findings.   Remaining deficits: None   Education / Equipment: HEP; lymphedema risk reduction Plan: Patient agrees to discharge.  Patient goals were met. Patient is being discharged due to meeting the stated rehab goals.  ?????         Annia Friendly, [place] 01/26/20 4:26 PM  [place], [place], [address] Phone: [phone]   Fax:  [phone]  Name: Debra [name] MRN: [id number] Date of Birth: November 15, 1960

## 2020-01-26 NOTE — Patient Instructions (Addendum)
Closed Chain: Shoulder Flexion / Extension - on Wall    Hands on wall, step backward. Return. Stepping causes shoulder flexion and extension Do __5_ times, holding 5 seconds, _2__ times per day.  http://ss.exer.us/265   Copyright  VHI. All rights reserved.  Closed Chain: Shoulder Abduction / Adduction - on Wall    One hand on wall, step to side and return. Stepping causes shoulder to abduct and adduct. Step _5__ times, holding 5 seconds, _2__ times per day.  http://ss.exer.us/267   Copyright  VHI. All rights reserved.             [facility]         [address], [place] [address]         [phone]         Annia Friendly, PT, CLT   After Breast Cancer Class It is recommended you attend the ABC class to be educated on lymphedema risk reduction. This class is free of charge and lasts for 1 hour. It is a 1-time class.    Scar massage Once your incisions are healed and the glue is gone, you can begin gentle scar massage with coconut oil or vitamin E cream a few minutes each day.   Home exercise Program Continue doing your exercises until you don't feel tightness at the end of the stretch.   Follow up PT: It is recommended you return every 3 months for the first 3 years following surgery to be assessed on the SOZO machine for an L-Dex score. This helps prevent clinically significant lymphedema in 95% of patients. These follow up screens are 15 minute appointments that you are not billed for. You are scheduled for 03/29/2020 at 4:15.

## 2020-02-03 ENCOUNTER — Ambulatory Visit
Admission: RE | Admit: 2020-02-03 | Discharge: 2020-02-03 | Disposition: A | Discharge status: Home / Self Care | Payer: BC Managed Care – PPO | Source: Ambulatory Visit | Attending: Radiation Oncology | Admitting: Radiation Oncology

## 2020-02-03 ENCOUNTER — Encounter: Payer: Self-pay | Admitting: Radiation Oncology

## 2020-02-03 ENCOUNTER — Other Ambulatory Visit: Payer: Self-pay

## 2020-02-03 DIAGNOSIS — Z170 Estrogen receptor positive status [ER+]: Secondary | ICD-10-CM

## 2020-02-03 DIAGNOSIS — C50211 Malignant neoplasm of upper-inner quadrant of right female breast: Secondary | ICD-10-CM

## 2020-02-03 HISTORY — DX: Wegener's granulomatosis without renal involvement: M31.30

## 2020-02-03 NOTE — Progress Notes (Signed)
Radiation Oncology         [phone] ________________________________  Outpatient Follow Up- Conducted via telephone due to current COVID-19 concerns for limiting patient exposure  I spoke with the patient to conduct this consult visit via telephone to spare the patient unnecessary potential exposure in the healthcare setting during the current COVID-19 pandemic. The patient was notified in advance and was offered a [place] meeting to allow for face to face communication but unfortunately reported that they did not have the appropriate resources/technology to support such a visit and instead preferred to proceed with a telephone visit.  Name: Debra [name]        MRN: [id number]  Date of Service: 02/03/2020 DOB: Sep 07, 1960  CC:[facility], [facility]     REFERRING PHYSICIAN: Dr. Ninfa Linden  DIAGNOSIS: The encounter diagnosis was Malignant neoplasm of upper-inner quadrant of right breast in female, estrogen receptor positive ([place]).   HISTORY OF PRESENT ILLNESS: Debra [name] is a 59 y.o. female  seen in the multidisciplinary breast clinic for a new diagnosis of right breast cancer. The patient was noted to have a palpable mass in the right breast for approximately 3 weeks.  She underwent diagnostic imaging which revealed a mass at the 1 o'clock position measuring 4.3 x 3.5 x 1.9 cm.  She had one abnormal appearing lymph node in the axilla on the right side.  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  Her lymph node biopsy was also benign.  She was counseled on the rationale for neoadjuvant chemotherapy given her functional triple negative status.  She proceeded with this course between 08/07/2019 and 12/03/2019.  She subsequently underwent right lumpectomy on 01/08/2020, no residual carcinoma was  identified within her right specimen, usual ductal hyperplasia with extensive adenosis and focal atypical ductal hyperplasia were identified.  Her single sentinel lymph node was also negative for disease.  She has plans to have her Port-A-Cath removed but not until after completion of radiotherapy.  She is contacted today to discuss the rationale for adjuvant radiotherapy.   PREVIOUS RADIATION THERAPY: No   PAST MEDICAL HISTORY:  Past Medical History:  Diagnosis Date  . Cancer [facility])    right breast  . Family history of adverse reaction to anesthesia    Post-op N/V  . Family history of breast cancer   . Family history of colon cancer   . Family history of nonmelanoma skin cancer   . 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 ABDOMINAL HYSTERECTOMY    . BREAST LUMPECTOMY WITH RADIOACTIVE SEED AND SENTINEL LYMPH NODE BIOPSY Right 01/08/2020   Procedure: RIGHT BREAST LUMPECTOMY WITH RADIOACTIVE SEED AND SENTINEL LYMPH NODE BIOPSY;  Surgeon: [name], MD;  Location: [place];  Service: General;  Laterality: Right;  LMA, PECTORAL BLOCK  . COLONOSCOPY    . 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 PORTACATH PLACEMENT Left 08/06/2019   Procedure: INSERTION PORT-A-CATH WITH ULTRASOUND GUIDANCE;  Surgeon: [name], MD;  Location: [place];  Service: General;  Laterality: Left;  . WISDOM TOOTH EXTRACTION       FAMILY HISTORY:  Family History  Problem Relation Age of Onset  .  Skin cancer Sister        non-melanoma dx. in her 48s  . Other Sister        normal genetic testing for hereditary cancer risks  . Colon cancer Paternal Grandfather        dx. in his 33s  . Wilm's tumor Son        dx. 9 months  . Breast cancer Other        dx. in her 38s (PGM's sister)  . Wilm's tumor Nephew 2  . Breast cancer Other         dx. in her 1s (PGM's niece)     SOCIAL HISTORY:  reports that she quit smoking about 29 years ago. Her smoking use included cigarettes. She has a 10.00 pack-year smoking history. She has never used smokeless tobacco. She reports current alcohol use of about 2.0 standard drinks of alcohol per week. She reports that she does not use drugs. The patient is married and lives in [place]. 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   ALLERGIES: Other   MEDICATIONS:  Current Outpatient Medications  Medication Sig Dispense Refill  . Multiple Vitamin (MULTI-VITAMIN DAILY PO) Take 1 tablet by mouth daily.     . traMADol (ULTRAM) 50 MG tablet Take 1 tablet (50 mg total) by mouth every 6 (six) hours as needed for moderate pain or severe pain. 25 tablet 0   No current facility-administered medications for this encounter.     REVIEW OF SYSTEMS: On review of systems, the patient reports she is not happy about the treatment schedule and feels that this was not clear during our last discussion. No other specific complaints were verbalized.    PHYSICAL EXAM:  Unable to assess given encounter type.   ECOG = 0  0 - Asymptomatic (Fully active, able to carry on all predisease activities without restriction)  1 - Symptomatic but completely ambulatory (Restricted in physically strenuous activity but ambulatory and able to carry out work of a light or sedentary nature. For example, light housework, office work)  2 - Symptomatic, <50% in bed during the day (Ambulatory and capable of all self care but unable to carry out any work activities. Up and about more than 50% of waking hours)  3 - Symptomatic, >50% in bed, but not bedbound (Capable of only limited self-care, confined to bed or chair 50% or more of waking hours)  4 - Bedbound (Completely disabled. Cannot carry on any self-care. Totally confined to bed or chair)  5 - Death   Eustace Pen MM, Creech RH, Tormey DC, et  al. [phone]). "Toxicity and response criteria of the [facility]". [place] Oncol. 5 (6): 649-55    LABORATORY DATA:  Lab Results  Component Value Date   WBC 6.4 01/07/2020   HGB 11.8 (L) 01/07/2020   HCT 36.5 01/07/2020   MCV 101.7 (H) 01/07/2020   PLT 235 01/07/2020   Lab Results  Component Value Date   NA 141 01/07/2020   K 4.0 01/07/2020   CL 107 01/07/2020   CO2 25 01/07/2020   Lab Results  Component Value Date   ALT 21 12/11/2019   AST 18 12/11/2019   ALKPHOS 92 12/11/2019   BILITOT 0.3 12/11/2019      RADIOGRAPHY: NM Sentinel Node Inj-No Rpt (Breast)  Result Date: 01/08/2020 Sulfur colloid was injected by the nuclear medicine technologist for melanoma sentinel node.   MM Breast Surgical Specimen  Result Date:  01/08/2020 CLINICAL DATA:  Status post excision of the right breast following radioactive seed localization of a lesion. EXAM: SPECIMEN RADIOGRAPH OF THE RIGHT BREAST COMPARISON:  Previous exam(s). FINDINGS: Status post excision of the right breast. The radioactive seed and biopsy marker clip are present, completely intact, and were marked for pathology. IMPRESSION: Specimen radiograph of the right breast. Electronically Signed   By: Lajean Manes M.D.   On: 01/08/2020 17:18   MM RT RADIOACTIVE SEED LOC MAMMO GUIDE  Result Date: 01/07/2020 CLINICAL DATA:  59 year old female presenting for radioactive seed localization of the right breast prior to lumpectomy. EXAM: MAMMOGRAPHIC GUIDED RADIOACTIVE SEED LOCALIZATION OF THE RIGHT BREAST COMPARISON:  Previous exam(s). FINDINGS: Patient presents for radioactive seed localization prior to right breast lumpectomy. I met with the patient and we discussed the procedure of seed localization including benefits and alternatives. We discussed the high likelihood of a successful procedure. We discussed the risks of the procedure including infection, bleeding, tissue injury and further surgery. We discussed  the low dose of radioactivity involved in the procedure. Informed, written consent was given. The usual time-out protocol was performed immediately prior to the procedure. Using mammographic guidance, sterile technique, 1% lidocaine and an I-125 radioactive seed, the ribbon shaped biopsy marking clip in the upper inner right breast was localized using a superior approach. The follow-up mammogram images confirm the seed in the expected location and were marked for Dr. Ninfa Linden. Follow-up survey of the patient confirms presence of the radioactive seed. Order number of I-125 seed:  [id number]. Total activity:  1.287 millicuries reference Date: 12/08/2019 The patient tolerated the procedure well and was released from the [facility]. She was given instructions regarding seed removal. IMPRESSION: Radioactive seed localization right breast. No apparent complications. Electronically Signed   By: Ammie Ferrier M.D.   On: 01/07/2020 13:38       IMPRESSION/PLAN: 1. Stage IIB, cT2N0 grade 3, weak ER positive, ER/HER2 negative (functional triple negative) invasive ductal carcinoma of the right breast. Dr. Lisbeth Renshaw discusses the final pathology findings and reviews the nature of right breast disease.  The patient had a complete response pathologically from her chemotherapy regimen.  Despite this favorable feature, Dr. Lisbeth Renshaw reviews the rationale to consider adjuvant radiotherapy as a way to reduce long-term risks of local recurrence following breast conservation. We discussed the risks, benefits, short, and long term effects of radiotherapy, and the patient is interested in proceeding. Dr. Lisbeth Renshaw discusses the delivery and logistics of radiotherapy and anticipates a course of 4  weeks of radiotherapy to the right breast. We reviewed specific concerns about secondary malignancies and also concerns about her history of necrotizing granulomatous inflammation she had in her RUL.  We also clarified the course and apologized  that there was misunderstanding about the course. Dr. Lisbeth Renshaw discussed the option to consider planning with prone positioning which the patient prefers to try. She will simulate tomorrow at 3 PM at which time she will sign consent to proceed.   Given current concerns for patient exposure during the COVID-19 pandemic, this encounter was conducted via telephone.  The patient has provided two factor identification and has given verbal consent for this type of encounter and has been advised to only accept a meeting of this type in a secure network environment. The time spent during this encounter was 45 minutes including preparation, discussion, and coordination of the patient's care. The attendants for this meeting include Dr. Lisbeth Renshaw, Hayden Pedro  and Juanetta Snow.  During the encounter,  Dr. Lisbeth Renshaw, and Hayden Pedro were located at [facility] Radiation Oncology Department.  Debra [name] was located at home.   The above documentation reflects my direct findings during this shared patient visit. Please see the separate note by Dr. Lisbeth Renshaw on this date for the remainder of the patient's plan of care.    Carola Rhine, PAC

## 2020-02-04 ENCOUNTER — Ambulatory Visit
Admission: RE | Admit: 2020-02-04 | Discharge: 2020-02-04 | Disposition: A | Discharge status: Home / Self Care | Payer: BC Managed Care – PPO | Source: Ambulatory Visit | Attending: Radiation Oncology | Admitting: Radiation Oncology

## 2020-02-04 ENCOUNTER — Other Ambulatory Visit: Payer: Self-pay

## 2020-02-04 DIAGNOSIS — Z510 Encounter for antineoplastic radiation therapy: Secondary | ICD-10-CM | POA: Diagnosis not present

## 2020-02-04 DIAGNOSIS — Z170 Estrogen receptor positive status [ER+]: Secondary | ICD-10-CM | POA: Diagnosis not present

## 2020-02-05 ENCOUNTER — Encounter: Payer: Self-pay | Admitting: *Deleted

## 2020-02-05 ENCOUNTER — Ambulatory Visit: Payer: BC Managed Care – PPO | Admitting: Radiation Oncology

## 2020-02-09 ENCOUNTER — Encounter: Payer: BC Managed Care – PPO | Admitting: Physical Therapy

## 2020-02-10 DIAGNOSIS — Z170 Estrogen receptor positive status [ER+]: Secondary | ICD-10-CM | POA: Diagnosis not present

## 2020-02-10 DIAGNOSIS — C50211 Malignant neoplasm of upper-inner quadrant of right female breast: Secondary | ICD-10-CM | POA: Insufficient documentation

## 2020-02-10 DIAGNOSIS — Z510 Encounter for antineoplastic radiation therapy: Secondary | ICD-10-CM | POA: Insufficient documentation

## 2020-02-11 ENCOUNTER — Ambulatory Visit
Admission: RE | Admit: 2020-02-11 | Discharge: 2020-02-11 | Disposition: A | Discharge status: Home / Self Care | Payer: BC Managed Care – PPO | Source: Ambulatory Visit | Attending: Radiation Oncology | Admitting: Radiation Oncology

## 2020-02-11 DIAGNOSIS — C50211 Malignant neoplasm of upper-inner quadrant of right female breast: Secondary | ICD-10-CM | POA: Diagnosis not present

## 2020-02-12 ENCOUNTER — Ambulatory Visit
Admission: RE | Admit: 2020-02-12 | Discharge: 2020-02-12 | Disposition: A | Discharge status: Home / Self Care | Payer: BC Managed Care – PPO | Source: Ambulatory Visit | Attending: Radiation Oncology | Admitting: Radiation Oncology

## 2020-02-12 DIAGNOSIS — C50211 Malignant neoplasm of upper-inner quadrant of right female breast: Secondary | ICD-10-CM | POA: Diagnosis not present

## 2020-02-12 NOTE — Progress Notes (Signed)
Pt here for patient teaching.  Pt given Radiation and You booklet, skin care instructions, Alra deodorant and Radiaplex gel.  Reviewed areas of pertinence such as fatigue, hair loss, skin changes, breast tenderness and breast swelling . Pt able to give teach back of to pat skin and use unscented/gentle soap,apply Radiaplex bid, avoid applying anything to skin within 4 hours of treatment, avoid wearing an under wire bra and to use an electric razor if they must shave. Pt verbalizes understanding of information given and will contact nursing with any questions or concerns.     Nyjah Schwake M. Madalena Kesecker RN, BSN      

## 2020-02-13 ENCOUNTER — Ambulatory Visit
Admission: RE | Admit: 2020-02-13 | Discharge: 2020-02-13 | Disposition: A | Discharge status: Home / Self Care | Payer: BC Managed Care – PPO | Source: Ambulatory Visit | Attending: Radiation Oncology | Admitting: Radiation Oncology

## 2020-02-13 ENCOUNTER — Other Ambulatory Visit: Payer: Self-pay

## 2020-02-13 DIAGNOSIS — C50211 Malignant neoplasm of upper-inner quadrant of right female breast: Secondary | ICD-10-CM

## 2020-02-13 DIAGNOSIS — Z170 Estrogen receptor positive status [ER+]: Secondary | ICD-10-CM

## 2020-02-13 MED ORDER — ALRA NON-METALLIC DEODORANT (RAD-ONC)
1.0000 "application " | Freq: Once | TOPICAL | Status: AC
  Administered 2020-02-13: 1 via TOPICAL

## 2020-02-13 MED ORDER — RADIAPLEXRX EX GEL: Freq: Once | CUTANEOUS | Status: AC

## 2020-02-16 ENCOUNTER — Ambulatory Visit
Admission: RE | Admit: 2020-02-16 | Discharge: 2020-02-16 | Disposition: A | Discharge status: Home / Self Care | Payer: BC Managed Care – PPO | Source: Ambulatory Visit | Attending: Radiation Oncology | Admitting: Radiation Oncology

## 2020-02-16 ENCOUNTER — Other Ambulatory Visit: Payer: Self-pay

## 2020-02-16 DIAGNOSIS — C50211 Malignant neoplasm of upper-inner quadrant of right female breast: Secondary | ICD-10-CM | POA: Diagnosis not present

## 2020-02-17 ENCOUNTER — Ambulatory Visit
Admission: RE | Admit: 2020-02-17 | Discharge: 2020-02-17 | Disposition: A | Discharge status: Home / Self Care | Payer: BC Managed Care – PPO | Source: Ambulatory Visit | Attending: Radiation Oncology | Admitting: Radiation Oncology

## 2020-02-17 ENCOUNTER — Other Ambulatory Visit: Payer: Self-pay

## 2020-02-17 DIAGNOSIS — C50211 Malignant neoplasm of upper-inner quadrant of right female breast: Secondary | ICD-10-CM | POA: Diagnosis not present

## 2020-02-18 ENCOUNTER — Ambulatory Visit
Admission: RE | Admit: 2020-02-18 | Discharge: 2020-02-18 | Disposition: A | Discharge status: Home / Self Care | Payer: BC Managed Care – PPO | Source: Ambulatory Visit | Attending: Radiation Oncology | Admitting: Radiation Oncology

## 2020-02-18 DIAGNOSIS — C50211 Malignant neoplasm of upper-inner quadrant of right female breast: Secondary | ICD-10-CM | POA: Diagnosis not present

## 2020-02-19 ENCOUNTER — Ambulatory Visit
Admission: RE | Admit: 2020-02-19 | Discharge: 2020-02-19 | Disposition: A | Discharge status: Home / Self Care | Payer: BC Managed Care – PPO | Source: Ambulatory Visit | Attending: Radiation Oncology | Admitting: Radiation Oncology

## 2020-02-19 DIAGNOSIS — C50211 Malignant neoplasm of upper-inner quadrant of right female breast: Secondary | ICD-10-CM | POA: Diagnosis not present

## 2020-02-20 ENCOUNTER — Ambulatory Visit
Admission: RE | Admit: 2020-02-20 | Discharge: 2020-02-20 | Disposition: A | Discharge status: Home / Self Care | Payer: BC Managed Care – PPO | Source: Ambulatory Visit | Attending: Radiation Oncology | Admitting: Radiation Oncology

## 2020-02-20 DIAGNOSIS — C50211 Malignant neoplasm of upper-inner quadrant of right female breast: Secondary | ICD-10-CM | POA: Diagnosis not present

## 2020-02-23 ENCOUNTER — Other Ambulatory Visit: Payer: Self-pay

## 2020-02-23 ENCOUNTER — Ambulatory Visit
Admission: RE | Admit: 2020-02-23 | Discharge: 2020-02-23 | Disposition: A | Discharge status: Home / Self Care | Payer: BC Managed Care – PPO | Source: Ambulatory Visit | Attending: Radiation Oncology | Admitting: Radiation Oncology

## 2020-02-23 DIAGNOSIS — C50211 Malignant neoplasm of upper-inner quadrant of right female breast: Secondary | ICD-10-CM | POA: Diagnosis not present

## 2020-02-24 ENCOUNTER — Ambulatory Visit
Admission: RE | Admit: 2020-02-24 | Discharge: 2020-02-24 | Disposition: A | Discharge status: Home / Self Care | Payer: BC Managed Care – PPO | Source: Ambulatory Visit | Attending: Radiation Oncology | Admitting: Radiation Oncology

## 2020-02-24 DIAGNOSIS — C50211 Malignant neoplasm of upper-inner quadrant of right female breast: Secondary | ICD-10-CM | POA: Diagnosis not present

## 2020-02-25 ENCOUNTER — Ambulatory Visit
Admission: RE | Admit: 2020-02-25 | Discharge: 2020-02-25 | Disposition: A | Discharge status: Home / Self Care | Payer: BC Managed Care – PPO | Source: Ambulatory Visit | Attending: Radiation Oncology | Admitting: Radiation Oncology

## 2020-02-25 DIAGNOSIS — C50211 Malignant neoplasm of upper-inner quadrant of right female breast: Secondary | ICD-10-CM | POA: Diagnosis not present

## 2020-02-26 ENCOUNTER — Ambulatory Visit
Admission: RE | Admit: 2020-02-26 | Discharge: 2020-02-26 | Disposition: A | Discharge status: Home / Self Care | Payer: BC Managed Care – PPO | Source: Ambulatory Visit | Attending: Radiation Oncology | Admitting: Radiation Oncology

## 2020-02-26 DIAGNOSIS — C50211 Malignant neoplasm of upper-inner quadrant of right female breast: Secondary | ICD-10-CM | POA: Diagnosis not present

## 2020-02-27 ENCOUNTER — Ambulatory Visit
Admission: RE | Admit: 2020-02-27 | Discharge: 2020-02-27 | Disposition: A | Discharge status: Home / Self Care | Payer: BC Managed Care – PPO | Source: Ambulatory Visit | Attending: Radiation Oncology | Admitting: Radiation Oncology

## 2020-02-27 DIAGNOSIS — C50211 Malignant neoplasm of upper-inner quadrant of right female breast: Secondary | ICD-10-CM | POA: Diagnosis not present

## 2020-03-01 ENCOUNTER — Ambulatory Visit
Admission: RE | Admit: 2020-03-01 | Discharge: 2020-03-01 | Disposition: A | Discharge status: Home / Self Care | Payer: BC Managed Care – PPO | Source: Ambulatory Visit | Attending: Radiation Oncology | Admitting: Radiation Oncology

## 2020-03-01 DIAGNOSIS — C50211 Malignant neoplasm of upper-inner quadrant of right female breast: Secondary | ICD-10-CM | POA: Diagnosis not present

## 2020-03-02 ENCOUNTER — Ambulatory Visit
Admission: RE | Admit: 2020-03-02 | Discharge: 2020-03-02 | Disposition: A | Discharge status: Home / Self Care | Payer: BC Managed Care – PPO | Source: Ambulatory Visit | Attending: Radiation Oncology | Admitting: Radiation Oncology

## 2020-03-02 DIAGNOSIS — Z170 Estrogen receptor positive status [ER+]: Secondary | ICD-10-CM

## 2020-03-02 DIAGNOSIS — C50211 Malignant neoplasm of upper-inner quadrant of right female breast: Secondary | ICD-10-CM | POA: Diagnosis not present

## 2020-03-02 MED ORDER — RADIAPLEXRX EX GEL: Freq: Once | CUTANEOUS | Status: AC

## 2020-03-03 ENCOUNTER — Ambulatory Visit
Admission: RE | Admit: 2020-03-03 | Discharge: 2020-03-03 | Disposition: A | Discharge status: Home / Self Care | Payer: BC Managed Care – PPO | Source: Ambulatory Visit | Attending: Radiation Oncology | Admitting: Radiation Oncology

## 2020-03-03 DIAGNOSIS — C50211 Malignant neoplasm of upper-inner quadrant of right female breast: Secondary | ICD-10-CM | POA: Diagnosis not present

## 2020-03-04 ENCOUNTER — Ambulatory Visit
Admission: RE | Admit: 2020-03-04 | Discharge: 2020-03-04 | Disposition: A | Discharge status: Home / Self Care | Payer: BC Managed Care – PPO | Source: Ambulatory Visit | Attending: Radiation Oncology | Admitting: Radiation Oncology

## 2020-03-04 ENCOUNTER — Ambulatory Visit: Payer: BC Managed Care – PPO

## 2020-03-04 ENCOUNTER — Other Ambulatory Visit: Payer: Self-pay

## 2020-03-04 DIAGNOSIS — C50211 Malignant neoplasm of upper-inner quadrant of right female breast: Secondary | ICD-10-CM | POA: Diagnosis not present

## 2020-03-05 ENCOUNTER — Ambulatory Visit: Payer: BC Managed Care – PPO

## 2020-03-05 ENCOUNTER — Ambulatory Visit
Admission: RE | Admit: 2020-03-05 | Discharge: 2020-03-05 | Disposition: A | Discharge status: Home / Self Care | Payer: BC Managed Care – PPO | Source: Ambulatory Visit | Attending: Radiation Oncology | Admitting: Radiation Oncology

## 2020-03-05 DIAGNOSIS — C50211 Malignant neoplasm of upper-inner quadrant of right female breast: Secondary | ICD-10-CM | POA: Diagnosis not present

## 2020-03-08 ENCOUNTER — Other Ambulatory Visit: Payer: Self-pay

## 2020-03-08 ENCOUNTER — Ambulatory Visit
Admission: RE | Admit: 2020-03-08 | Discharge: 2020-03-08 | Disposition: A | Discharge status: Home / Self Care | Payer: BC Managed Care – PPO | Source: Ambulatory Visit | Attending: Radiation Oncology | Admitting: Radiation Oncology

## 2020-03-08 DIAGNOSIS — Z510 Encounter for antineoplastic radiation therapy: Secondary | ICD-10-CM | POA: Diagnosis not present

## 2020-03-08 DIAGNOSIS — Z170 Estrogen receptor positive status [ER+]: Secondary | ICD-10-CM | POA: Diagnosis not present

## 2020-03-08 DIAGNOSIS — C50211 Malignant neoplasm of upper-inner quadrant of right female breast: Secondary | ICD-10-CM | POA: Insufficient documentation

## 2020-03-09 ENCOUNTER — Ambulatory Visit
Admission: RE | Admit: 2020-03-09 | Discharge: 2020-03-09 | Disposition: A | Discharge status: Home / Self Care | Payer: BC Managed Care – PPO | Source: Ambulatory Visit | Attending: Radiation Oncology | Admitting: Radiation Oncology

## 2020-03-09 ENCOUNTER — Encounter: Payer: Self-pay | Admitting: Radiation Oncology

## 2020-03-09 ENCOUNTER — Encounter: Payer: Self-pay | Admitting: *Deleted

## 2020-03-09 ENCOUNTER — Ambulatory Visit: Payer: BC Managed Care – PPO

## 2020-03-09 DIAGNOSIS — C50211 Malignant neoplasm of upper-inner quadrant of right female breast: Secondary | ICD-10-CM | POA: Diagnosis not present

## 2020-03-10 ENCOUNTER — Ambulatory Visit: Payer: BC Managed Care – PPO

## 2020-03-11 ENCOUNTER — Ambulatory Visit: Payer: BC Managed Care – PPO

## 2020-03-24 ENCOUNTER — Encounter (HOSPITAL_BASED_OUTPATIENT_CLINIC_OR_DEPARTMENT_OTHER): Payer: Self-pay | Admitting: Surgery

## 2020-03-24 ENCOUNTER — Other Ambulatory Visit: Payer: Self-pay

## 2020-03-29 ENCOUNTER — Ambulatory Visit: Payer: BC Managed Care – PPO | Attending: Surgery

## 2020-03-29 ENCOUNTER — Other Ambulatory Visit: Payer: Self-pay

## 2020-03-29 DIAGNOSIS — Z483 Aftercare following surgery for neoplasm: Secondary | ICD-10-CM | POA: Insufficient documentation

## 2020-03-29 NOTE — Therapy (Signed)
[place], [place], [address] Phone: [phone]   Fax:  [phone]  Physical Therapy Treatment  Patient Details  Name: Debra [name] MRN: [id number] Date of Birth: August 17, 1960 Referring Provider (PT): Dr. [name]   Encounter Date: 03/29/2020   PT End of Session - 03/29/20 1637    Visit Number 2    Number of Visits 2    Date for PT Re-Evaluation 09/24/19    PT Start Time 1628    PT Stop Time 1637    PT Time Calculation (min) 9 min    Activity Tolerance Patient tolerated treatment well    Behavior During Therapy [facility] for tasks assessed/performed           Past Medical History:  Diagnosis Date  . Cancer [facility])    right breast  . Family history of adverse reaction to anesthesia    Post-op N/V  . Family history of breast cancer   . Family history of colon cancer   . Family history of nonmelanoma skin cancer   . 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 ABDOMINAL HYSTERECTOMY    . BREAST LUMPECTOMY WITH RADIOACTIVE SEED AND SENTINEL LYMPH NODE BIOPSY Right 01/08/2020   Procedure: RIGHT BREAST LUMPECTOMY WITH RADIOACTIVE SEED AND SENTINEL LYMPH NODE BIOPSY;  Surgeon: [name], MD;  Location: [facility];  Service: General;  Laterality: Right;  LMA, PECTORAL BLOCK  . COLONOSCOPY    . 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 PORTACATH PLACEMENT Left 08/06/2019   Procedure: INSERTION PORT-A-CATH WITH ULTRASOUND GUIDANCE;  Surgeon: [name], MD;  Location: [facility];  Service: General;  Laterality: Left;  . WISDOM TOOTH EXTRACTION      There were no vitals filed for this visit.   Subjective Assessment - 03/29/20 1630    Subjective Pt returns for 3 month L-Dex screen.    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 Patient underwent neoadjuvant chemotherapy from 08/07/2019-12/03/2019. She had a right lumpectomy and sentinel node biopsy (1 negative node) on 01/08/2020.                  L-DEX FLOWSHEETS - 03/29/20 1600      L-DEX LYMPHEDEMA SCREENING   BASELINE SCORE (UNILATERAL) 1.8    L-DEX SCORE (UNILATERAL) -0.8    VALUE CHANGE (UNILAT) -2.6                                  PT Long Term Goals - 01/26/20 1625      PT LONG TERM GOAL #1   Title Patient will demonstrate she has regained full shoulder ROM and function post operatively compared to baselines.    Time 8    Period Weeks    Status Achieved                 Plan - 03/29/20 1638    Clinical Impression Statement Pt returns for 3 month L-dex screen. Her change from baseline of -2.6 is WNLs so no further treatment is needed at this time except to cont every 3 month screens for up to 2 years from [facility], and then every 6 months after that.    PT Next Visit  Plan Cont L-Dex screenings as indicated above.    Consulted and Agree with Plan of Care Patient           Patient will benefit from skilled therapeutic intervention in order to improve the following deficits and impairments:     Visit Diagnosis: Aftercare following surgery for neoplasm     Problem List Patient Active Problem List   Diagnosis Date Noted  . Genetic testing 11/07/2019  . Family history of breast cancer   . Family history of colon cancer   . Family history of nonmelanoma skin cancer   . Chemotherapy induced neutropenia ([place]) 08/15/2019  . Malignant neoplasm of upper-inner quadrant of right breast in female, estrogen receptor positive ([place]) 07/23/2019    [name], PTA 03/29/2020, 4:40 PM  [place], [place], [address] Phone: [phone]   Fax:   [phone]  Name: Debra [name] MRN: [id number] Date of Birth: [birth year]

## 2020-04-02 ENCOUNTER — Other Ambulatory Visit (HOSPITAL_COMMUNITY)
Admission: RE | Admit: 2020-04-02 | Discharge: 2020-04-02 | Disposition: A | Discharge status: Home / Self Care | Payer: BC Managed Care – PPO | Source: Ambulatory Visit | Attending: Surgery | Admitting: Surgery

## 2020-04-02 DIAGNOSIS — Z20822 Contact with and (suspected) exposure to covid-19: Secondary | ICD-10-CM | POA: Diagnosis not present

## 2020-04-02 DIAGNOSIS — Z01812 Encounter for preprocedural laboratory examination: Secondary | ICD-10-CM | POA: Insufficient documentation

## 2020-04-02 DIAGNOSIS — Z452 Encounter for adjustment and management of vascular access device: Secondary | ICD-10-CM | POA: Diagnosis not present

## 2020-04-02 DIAGNOSIS — Z853 Personal history of malignant neoplasm of breast: Secondary | ICD-10-CM | POA: Diagnosis not present

## 2020-04-02 LAB — SARS CORONAVIRUS 2 (TAT 6-24 HRS): SARS Coronavirus 2: NEGATIVE

## 2020-04-04 NOTE — H&P (Signed)
   Debra [name] Location: [facility] Patient #: [id number] DOB: 1960/07/26 Married / Language: English / Race: White Female   History of Present Illness  The patient is a 59 year old female presenting for a post-operative visit. She is here for her postoperative visit status post radioactive seed guided right breast lumpectomy and sentinel node biopsy. She is doing well and has no complaints.   Allergies Malachi Bonds, CMA; No Known Allergies   Medication History No Current Medications Medications Reconciled  Vitals  Weight: 202.4 lb Height: 69.5in Body Surface Area: 2.09 m Body Mass Index: 29.46 kg/m  Temp.: 97.50F  Pulse: 97 (Regular)  BP: 140/80(Sitting, Left Arm, Standard)       Physical Exam  The physical exam findings are as follows: Note: On physical examination, she looks well  The right breast incision and axillary incision are healing well without evidence of infection.  Again, the final pathology showed no residual cancer in the breast. The lymph node was negative as well consistent with her complete pathologic response to her neoadjuvant therapy    Assessment & Plan   POSTOP CHECK  RIGHT BREAST CANCER PORT-A-CATH IN PLACE  Impression: At this point she is doing very well. She will now start radiation therapy. The plan will be to remove the Port-A-Cath which is finished with radiation. We have already done the paperwork for surgery. She will call and let us know when the last treatment this week and then get her on the schedule for port removal. Again, she has had a complete response to her neoadjuvant therapy. We discussed the risks of port removal.

## 2020-04-05 ENCOUNTER — Ambulatory Visit (HOSPITAL_BASED_OUTPATIENT_CLINIC_OR_DEPARTMENT_OTHER): Payer: BC Managed Care – PPO | Admitting: Certified Registered"

## 2020-04-05 ENCOUNTER — Telehealth: Payer: Self-pay | Admitting: Radiation Oncology

## 2020-04-05 ENCOUNTER — Encounter (HOSPITAL_BASED_OUTPATIENT_CLINIC_OR_DEPARTMENT_OTHER)
Admission: RE | Disposition: A | Discharge status: Home / Self Care | Payer: Self-pay | Source: Home / Self Care | Attending: Surgery

## 2020-04-05 ENCOUNTER — Other Ambulatory Visit: Payer: Self-pay

## 2020-04-05 ENCOUNTER — Ambulatory Visit (HOSPITAL_BASED_OUTPATIENT_CLINIC_OR_DEPARTMENT_OTHER)
Admission: RE | Admit: 2020-04-05 | Discharge: 2020-04-05 | Disposition: A | Discharge status: Home / Self Care | Payer: BC Managed Care – PPO | Attending: Surgery | Admitting: Surgery

## 2020-04-05 ENCOUNTER — Telehealth: Payer: Self-pay | Admitting: Oncology

## 2020-04-05 ENCOUNTER — Encounter (HOSPITAL_BASED_OUTPATIENT_CLINIC_OR_DEPARTMENT_OTHER): Payer: Self-pay | Admitting: Surgery

## 2020-04-05 DIAGNOSIS — Z452 Encounter for adjustment and management of vascular access device: Secondary | ICD-10-CM | POA: Insufficient documentation

## 2020-04-05 DIAGNOSIS — Z20822 Contact with and (suspected) exposure to covid-19: Secondary | ICD-10-CM | POA: Insufficient documentation

## 2020-04-05 DIAGNOSIS — Z853 Personal history of malignant neoplasm of breast: Secondary | ICD-10-CM | POA: Insufficient documentation

## 2020-04-05 HISTORY — PX: PORT-A-CATH REMOVAL: SHX5289

## 2020-04-05 SURGERY — REMOVAL PORT-A-CATH
Anesthesia: Monitor Anesthesia Care | Site: Chest | Laterality: Left

## 2020-04-05 MED ORDER — CEFAZOLIN SODIUM-DEXTROSE 2-4 GM/100ML-% IV SOLN: 2.0000 g | INTRAVENOUS | Status: DC

## 2020-04-05 MED ORDER — MIDAZOLAM HCL 2 MG/2ML IJ SOLN
INTRAMUSCULAR | Status: AC
  Filled 2020-04-05: qty 2

## 2020-04-05 MED ORDER — LIDOCAINE-EPINEPHRINE (PF) 1 %-1:200000 IJ SOLN
INTRAMUSCULAR | Status: DC | PRN
  Administered 2020-04-05: 10 mL

## 2020-04-05 MED ORDER — CHLORHEXIDINE GLUCONATE CLOTH 2 % EX PADS: 6.0000 | MEDICATED_PAD | Freq: Once | CUTANEOUS | Status: DC

## 2020-04-05 MED ORDER — CEFAZOLIN SODIUM-DEXTROSE 2-4 GM/100ML-% IV SOLN
INTRAVENOUS | Status: AC
  Filled 2020-04-05: qty 100

## 2020-04-05 MED ORDER — ACETAMINOPHEN 500 MG PO TABS
1000.0000 mg | ORAL_TABLET | ORAL | Status: AC
  Administered 2020-04-05: 1000 mg via ORAL

## 2020-04-05 MED ORDER — HYDROMORPHONE HCL 1 MG/ML IJ SOLN: 0.2500 mg | INTRAMUSCULAR | Status: DC | PRN

## 2020-04-05 MED ORDER — MIDAZOLAM HCL 5 MG/5ML IJ SOLN
INTRAMUSCULAR | Status: DC | PRN
  Administered 2020-04-05: 2 mg via INTRAVENOUS

## 2020-04-05 MED ORDER — ONDANSETRON HCL 4 MG/2ML IJ SOLN
INTRAMUSCULAR | Status: DC | PRN
  Administered 2020-04-05: 4 mg via INTRAVENOUS

## 2020-04-05 MED ORDER — LIDOCAINE-EPINEPHRINE (PF) 1 %-1:200000 IJ SOLN
INTRAMUSCULAR | Status: AC
  Filled 2020-04-05: qty 30

## 2020-04-05 MED ORDER — FENTANYL CITRATE (PF) 100 MCG/2ML IJ SOLN
INTRAMUSCULAR | Status: DC | PRN
  Administered 2020-04-05 (×2): 50 ug via INTRAVENOUS

## 2020-04-05 MED ORDER — OXYCODONE HCL 5 MG PO TABS: 5.0000 mg | ORAL_TABLET | Freq: Once | ORAL | Status: DC | PRN

## 2020-04-05 MED ORDER — OXYCODONE HCL 5 MG/5ML PO SOLN: 5.0000 mg | Freq: Once | ORAL | Status: DC | PRN

## 2020-04-05 MED ORDER — ONDANSETRON HCL 4 MG/2ML IJ SOLN
INTRAMUSCULAR | Status: AC
  Filled 2020-04-05: qty 2

## 2020-04-05 MED ORDER — PROPOFOL 10 MG/ML IV BOLUS
INTRAVENOUS | Status: AC
  Filled 2020-04-05: qty 20

## 2020-04-05 MED ORDER — ACETAMINOPHEN 500 MG PO TABS
ORAL_TABLET | ORAL | Status: AC
  Filled 2020-04-05: qty 2

## 2020-04-05 MED ORDER — LACTATED RINGERS IV SOLN: INTRAVENOUS | Status: DC

## 2020-04-05 MED ORDER — LIDOCAINE 2% (20 MG/ML) 5 ML SYRINGE
INTRAMUSCULAR | Status: AC
  Filled 2020-04-05: qty 5

## 2020-04-05 MED ORDER — FENTANYL CITRATE (PF) 100 MCG/2ML IJ SOLN
INTRAMUSCULAR | Status: AC
  Filled 2020-04-05: qty 2

## 2020-04-05 MED ORDER — PROMETHAZINE HCL 25 MG/ML IJ SOLN: 6.2500 mg | INTRAMUSCULAR | Status: DC | PRN

## 2020-04-05 MED ORDER — PROPOFOL 500 MG/50ML IV EMUL
INTRAVENOUS | Status: DC | PRN
  Administered 2020-04-05: 150 ug/kg/min via INTRAVENOUS

## 2020-04-05 MED ORDER — ENSURE PRE-SURGERY PO LIQD: 296.0000 mL | Freq: Once | ORAL | Status: DC

## 2020-04-05 SURGICAL SUPPLY — 30 items
BLADE SURG 15 STRL LF DISP TIS (BLADE) ×1 IMPLANT
BLADE SURG 15 STRL SS (BLADE) ×1
CHLORAPREP W/TINT 26 (MISCELLANEOUS) ×2 IMPLANT
COVER BACK TABLE 60X90IN (DRAPES) ×2 IMPLANT
COVER MAYO STAND STRL (DRAPES) ×2 IMPLANT
COVER WAND RF STERILE (DRAPES) IMPLANT
DECANTER SPIKE VIAL GLASS SM (MISCELLANEOUS) IMPLANT
DERMABOND ADVANCED (GAUZE/BANDAGES/DRESSINGS) ×1
DERMABOND ADVANCED .7 DNX12 (GAUZE/BANDAGES/DRESSINGS) ×1 IMPLANT
DRAPE LAPAROTOMY 100X72 PEDS (DRAPES) ×2 IMPLANT
DRAPE UTILITY XL STRL (DRAPES) ×2 IMPLANT
ELECT REM PT RETURN 9FT ADLT (ELECTROSURGICAL) ×2
ELECTRODE REM PT RTRN 9FT ADLT (ELECTROSURGICAL) ×1 IMPLANT
GLOVE BIO SURGEON STRL SZ 6.5 (GLOVE) ×2 IMPLANT
GLOVE SURG SIGNA 7.5 PF LTX (GLOVE) ×2 IMPLANT
GOWN STRL REUS W/ TWL LRG LVL3 (GOWN DISPOSABLE) ×1 IMPLANT
GOWN STRL REUS W/ TWL XL LVL3 (GOWN DISPOSABLE) ×1 IMPLANT
GOWN STRL REUS W/TWL LRG LVL3 (GOWN DISPOSABLE) ×1
GOWN STRL REUS W/TWL XL LVL3 (GOWN DISPOSABLE) ×1
NEEDLE HYPO 25X1 1.5 SAFETY (NEEDLE) ×2 IMPLANT
NS IRRIG 1000ML POUR BTL (IV SOLUTION) ×2 IMPLANT
PACK BASIN DAY SURGERY FS (CUSTOM PROCEDURE TRAY) ×2 IMPLANT
PENCIL SMOKE EVACUATOR (MISCELLANEOUS) ×2 IMPLANT
SLEEVE SCD COMPRESS KNEE MED (MISCELLANEOUS) IMPLANT
SUT MNCRL AB 4-0 PS2 18 (SUTURE) ×2 IMPLANT
SUT VIC AB 3-0 SH 27 (SUTURE) ×1
SUT VIC AB 3-0 SH 27X BRD (SUTURE) ×1 IMPLANT
SYR BULB EAR ULCER 3OZ GRN STR (SYRINGE) IMPLANT
SYR CONTROL 10ML LL (SYRINGE) ×2 IMPLANT
TOWEL GREEN STERILE FF (TOWEL DISPOSABLE) ×2 IMPLANT

## 2020-04-05 NOTE — Transfer of Care (Signed)
Immediate Anesthesia Transfer of Care Note  Patient: Debra [name]  Procedure(s) Performed: REMOVAL PORT-A-CATH (Left Chest)  Patient Location: PACU  Anesthesia Type:MAC  Level of Consciousness: awake, alert  and oriented  Airway & Oxygen Therapy: Patient Spontanous Breathing and Patient connected to face mask oxygen  Post-op Assessment: Report given to RN and Post -op Vital signs reviewed and stable  Post vital signs: Reviewed and stable  Last Vitals:  Vitals Value Taken Time  BP 102/54 04/05/20 1530  Temp 36.6 C 04/05/20 1530  Pulse 74 04/05/20 1530  Resp 14 04/05/20 1530  SpO2 100 % 04/05/20 1530  Vitals shown include unvalidated device data.  Last Pain:  Vitals:   04/05/20 1439  TempSrc: Oral  PainSc: 0-No pain         Complications: No complications documented.

## 2020-04-05 NOTE — Progress Notes (Signed)
  Radiation Oncology         [phone] ________________________________  Name: Debra [name] MRN: [id number]  Date: 03/09/2020  DOB: 21-Jun-1960  End of Treatment Note  Diagnosis:   right-sided breast cancer     Indication for treatment:  Curative       Radiation treatment dates:   02/12/20 - 03/09/20  Site/dose:   The patient initially received a dose of 42.56 Gy in 16 fractions to the breast using whole-breast tangent fields using a prone setup. This was delivered using a 3-D conformal technique. The patient then received a boost to the seroma. This delivered an additional 8 Gy in 86fractions using a 3 field photon technique due to the depth of the seroma. The total dose was 50.56 Gy.  Narrative: The patient tolerated radiation treatment relatively well.   The patient had some expected skin irritation as she progressed during treatment.   Plan: The patient has completed radiation treatment. The patient will return to radiation oncology clinic for routine followup in one month. I advised the patient to call or return sooner if they have any questions or concerns related to their recovery or treatment. ________________________________  Jodelle Gross, M.D., Ph.D.

## 2020-04-05 NOTE — Anesthesia Preprocedure Evaluation (Deleted)
Anesthesia Physical Anesthesia Plan  ASA: II  Anesthesia Plan: General   Post-op Pain Management:    Induction: Intravenous  PONV Risk Score and Plan: 3 and Ondansetron, Dexamethasone, Midazolam and Treatment may vary due to age or medical condition  Airway Management Planned: LMA  Additional Equipment: None  Intra-op Plan:   Post-operative Plan: Extubation in OR  Informed Consent: I have reviewed the patients History and Physical, chart, labs and discussed the procedure including the risks, benefits and alternatives for the proposed anesthesia with the patient or authorized representative who has indicated his/her understanding and acceptance.     Dental advisory given  Plan Discussed with:   Anesthesia Plan Comments:         Anesthesia Quick Evaluation   Anesthesia Plan  ASA: III  Anesthesia Plan: MAC   Post-op Pain Management:    Induction: Intravenous  PONV Risk Score and Plan: 2 and Midazolam, Ondansetron and Treatment may vary due to age or medical condition  Airway Management Planned: Simple Face Mask  Additional Equipment:   Intra-op Plan:   Post-operative Plan:   Informed Consent:   Plan Discussed with:   Anesthesia Plan Comments: (PAT note written 01/07/2020 by Myra Gianotti, PA-C. )        Anesthesia Quick Evaluation                                  Anesthesia Evaluation  Patient identified by MRN, date of birth, ID band Patient awake    Reviewed: Allergy & Precautions, NPO status , Patient's Chart, lab work & pertinent test results  History of Anesthesia Complications Negative for: history of anesthetic complications  Airway Mallampati: III  TM Distance: >3 FB Neck ROM: Full    Dental  (+) Teeth Intact   Pulmonary neg pulmonary ROS, former smoker,    Pulmonary exam normal        Cardiovascular negative cardio ROS Normal cardiovascular exam      Neuro/Psych negative neurological ROS  negative psych ROS   GI/Hepatic negative GI ROS, Neg liver ROS,   Endo/Other  negative endocrine ROS  Renal/GU negative Renal ROS  negative genitourinary   Musculoskeletal negative musculoskeletal ROS (+)   Abdominal   Peds  Hematology negative hematology ROS (+)   Anesthesia Other Findings  Breast cancer  Reproductive/Obstetrics                                                             Anesthesia Evaluation  Patient identified by MRN, date of birth, ID band  Reviewed: Allergy & Precautions, NPO status , Patient's Chart, lab work & pertinent test results  Airway Mallampati: II  TM Distance: >3 FB Neck ROM: Full    Dental no notable dental hx.    Pulmonary former smoker,    Pulmonary exam normal breath sounds clear to auscultation       Cardiovascular Exercise Tolerance: Good Normal cardiovascular exam Rhythm:Regular Rate:Normal  CV: Echo 08/05/19 (pre-chemo): IMPRESSIONS  1. Left ventricular ejection fraction, by estimation, is 60 to 65%. The  left ventricle has normal function. Left  ventricular endocardial border  not optimally defined to evaluate regional wall motion. Left ventricular  diastolic parameters were normal.  2. Right ventricular systolic function is normal. The right ventricular  size is normal. Tricuspid regurgitation signal is inadequate for assessing  PA pressure.  3. The mitral valve is normal in structure. Trivial mitral valve  regurgitation. No evidence of mitral stenosis.  4. The aortic valve is tricuspid. Aortic valve regurgitation is not  visualized. No aortic stenosis is present.  5. The inferior vena cava is normal in size with greater than 50%  respiratory variability, suggesting right atrial pressure of 3 mmHg.    Neuro/Psych negative neurological ROS  negative psych ROS   GI/Hepatic negative GI ROS, Neg liver ROS,   Endo/Other  negative  endocrine ROS  Renal/GU      Musculoskeletal negative musculoskeletal ROS (+)   Abdominal   Peds  Hematology  (+) anemia ,   Anesthesia Other Findings Breast cancer   Reproductive/Obstetrics negative OB ROS                              Anesthesia Physical

## 2020-04-05 NOTE — Telephone Encounter (Signed)
Left message for patient per 11/29 sch msg - unable to reach pt - left message with appt date and time

## 2020-04-05 NOTE — Anesthesia Preprocedure Evaluation (Signed)
Anesthesia Evaluation  Patient identified by MRN, date of birth, ID band Patient awake    Reviewed: Allergy & Precautions, NPO status , Patient's Chart, lab work & pertinent test results  Airway Mallampati: II  TM Distance: >3 FB Neck ROM: Full    Dental no notable dental hx.    Pulmonary neg pulmonary ROS, former smoker,    Pulmonary exam normal breath sounds clear to auscultation       Cardiovascular negative cardio ROS Normal cardiovascular exam Rhythm:Regular Rate:Normal     Neuro/Psych negative neurological ROS  negative psych ROS   GI/Hepatic negative GI ROS, Neg liver ROS,   Endo/Other  negative endocrine ROS  Renal/GU negative Renal ROS  negative genitourinary   Musculoskeletal negative musculoskeletal ROS (+)   Abdominal   Peds negative pediatric ROS (+)  Hematology negative hematology ROS (+)   Anesthesia Other Findings   Reproductive/Obstetrics negative OB ROS                             Anesthesia Physical Anesthesia Plan  ASA: III  Anesthesia Plan: MAC   Post-op Pain Management:    Induction: Intravenous  PONV Risk Score and Plan: 2 and Ondansetron, Midazolam and Treatment may vary due to age or medical condition  Airway Management Planned: Simple Face Mask  Additional Equipment:   Intra-op Plan:   Post-operative Plan:   Informed Consent: I have reviewed the patients History and Physical, chart, labs and discussed the procedure including the risks, benefits and alternatives for the proposed anesthesia with the patient or authorized representative who has indicated his/her understanding and acceptance.     Dental advisory given  Plan Discussed with: CRNA  Anesthesia Plan Comments:         Anesthesia Quick Evaluation

## 2020-04-05 NOTE — Discharge Instructions (Signed)
°  Post Anesthesia Home Care Instructions  Activity: Get plenty of rest for the remainder of the day. A responsible individual must stay with you for 24 hours following the procedure.  For the next 24 hours, DO NOT: -Drive a car -Paediatric nurse -Drink alcoholic beverages -Take any medication unless instructed by your physician -Make any legal decisions or sign important papers.  Meals: Start with liquid foods such as gelatin or soup. Progress to regular foods as tolerated. Avoid greasy, spicy, heavy foods. If nausea and/or vomiting occur, drink only clear liquids until the nausea and/or vomiting subsides. Call your physician if vomiting continues.  Special Instructions/Symptoms: Your throat may feel dry or sore from the anesthesia or the breathing tube placed in your throat during surgery. If this causes discomfort, gargle with warm salt water. The discomfort should disappear within 24 hours.  If you had a scopolamine patch placed behind your ear for the management of post- operative nausea and/or vomiting:  1. The medication in the patch is effective for 72 hours, after which it should be removed.  Wrap patch in a tissue and discard in the trash. Wash hands thoroughly with soap and water. 2. You may remove the patch earlier than 72 hours if you experience unpleasant side effects which may include dry mouth, dizziness or visual disturbances. 3. Avoid touching the patch. Wash your hands with soap and water after contact with the patch.       Ok to shower starting tomorrow  Ice pack, tylenol, and ibuprofen for pain  No vigorous activity for one week

## 2020-04-05 NOTE — Anesthesia Preprocedure Evaluation (Deleted)
Anesthesia Physical Anesthesia Plan  ASA: II  Anesthesia Plan: General   Post-op Pain Management:    Induction: Intravenous  PONV Risk Score and Plan: 3 and Ondansetron, Dexamethasone, Midazolam and Treatment may vary due to age or medical condition  Airway Management Planned: LMA  Additional Equipment: None  Intra-op Plan:   Post-operative Plan: Extubation in OR  Informed Consent: I have reviewed the patients History and Physical, chart, labs and discussed the procedure including the risks, benefits and alternatives for the proposed anesthesia with the patient or authorized representative who has indicated his/her understanding and acceptance.     Dental advisory given  Plan Discussed with:   Anesthesia Plan Comments:         Anesthesia Quick Evaluation   Anesthesia Plan  ASA: III  Anesthesia Plan: MAC   Post-op Pain Management:    Induction:   PONV Risk Score and Plan: 2 and Midazolam, Ondansetron and Treatment may vary due to age or medical condition  Airway Management Planned: Simple Face Mask  Additional Equipment:   Intra-op Plan:   Post-operative Plan:   Informed Consent:   Plan Discussed with:   Anesthesia Plan Comments: (PAT note written 01/07/2020 by Myra Gianotti, PA-C. )        Anesthesia Quick Evaluation                                  Anesthesia Evaluation  Patient identified by MRN, date of birth, ID band Patient awake    Reviewed: Allergy & Precautions, NPO status , Patient's Chart, lab work & pertinent test results  History of Anesthesia Complications Negative for: history of anesthetic complications  Airway Mallampati: III  TM Distance: >3 FB Neck ROM: Full    Dental  (+) Teeth Intact   Pulmonary neg pulmonary ROS, former smoker,    Pulmonary exam normal        Cardiovascular negative cardio ROS Normal cardiovascular exam      Neuro/Psych negative neurological ROS  negative psych ROS   GI/Hepatic negative GI ROS, Neg liver ROS,   Endo/Other  negative endocrine ROS  Renal/GU negative Renal ROS  negative genitourinary   Musculoskeletal negative musculoskeletal ROS (+)   Abdominal   Peds  Hematology negative hematology ROS (+)   Anesthesia Other Findings  Breast cancer  Reproductive/Obstetrics                                                             Anesthesia Evaluation  Patient identified by MRN, date of birth, ID band  Reviewed: Allergy & Precautions, NPO status , Patient's Chart, lab work & pertinent test results  Airway Mallampati: II  TM Distance: >3 FB Neck ROM: Full    Dental no notable dental hx.    Pulmonary former smoker,    Pulmonary exam normal breath sounds clear to auscultation       Cardiovascular Exercise Tolerance: Good Normal cardiovascular exam Rhythm:Regular Rate:Normal  CV: Echo 08/05/19 (pre-chemo): IMPRESSIONS  1. Left ventricular ejection fraction, by estimation, is 60 to 65%. The  left ventricle has normal function. Left  ventricular endocardial border  not optimally defined to evaluate regional wall motion. Left ventricular  diastolic parameters were normal.  2. Right ventricular systolic function is normal. The right ventricular  size is normal. Tricuspid regurgitation signal is inadequate for assessing  PA pressure.  3. The mitral valve is normal in structure. Trivial mitral valve  regurgitation. No evidence of mitral stenosis.  4. The aortic valve is tricuspid. Aortic valve regurgitation is not  visualized. No aortic stenosis is present.  5. The inferior vena cava is normal in size with greater than 50%  respiratory variability, suggesting right atrial pressure of 3 mmHg.    Neuro/Psych negative neurological ROS  negative psych ROS   GI/Hepatic negative GI ROS, Neg liver ROS,   Endo/Other  negative  endocrine ROS  Renal/GU      Musculoskeletal negative musculoskeletal ROS (+)   Abdominal   Peds  Hematology  (+) anemia ,   Anesthesia Other Findings Breast cancer   Reproductive/Obstetrics negative OB ROS                              Anesthesia Physical

## 2020-04-05 NOTE — Anesthesia Postprocedure Evaluation (Signed)
Anesthesia Post Note  Patient: Debra [name]  Procedure(s) Performed: REMOVAL PORT-A-CATH (Left Chest)     Patient location during evaluation: PACU Anesthesia Type: MAC Level of consciousness: awake and alert Pain management: pain level controlled Vital Signs Assessment: post-procedure vital signs reviewed and stable Respiratory status: spontaneous breathing, nonlabored ventilation and respiratory function stable Cardiovascular status: blood pressure returned to baseline and stable Postop Assessment: no apparent nausea or vomiting Anesthetic complications: no   No complications documented.  Last Vitals:  Vitals:   04/05/20 1546 04/05/20 1559  BP:  124/68  Pulse: 73 75  Resp: 15 16  Temp:  (!) 36.2 C  SpO2: 100% 100%    Last Pain:  Vitals:   04/05/20 1559  TempSrc:   PainSc: 0-No pain                 Lynda Rainwater

## 2020-04-05 NOTE — Interval H&P Note (Signed)
History and Physical Interval Note:no change in H and P  04/05/2020 2:23 PM  Debra [name]  has presented today for surgery, with the diagnosis of HISTORY OF BREAST CANCER, PORT NO LONGER NEEDED.  The various methods of treatment have been discussed with the patient and family. After consideration of risks, benefits and other options for treatment, the patient has consented to  Procedure(s): REMOVAL PORT-A-CATH (N/A) as a surgical intervention.  The patient's history has been reviewed, patient examined, no change in status, stable for surgery.  I have reviewed the patient's chart and labs.  Questions were answered to the patient's satisfaction.     [name]

## 2020-04-05 NOTE — Telephone Encounter (Signed)
  Radiation Oncology         [phone] ________________________________  Name: Debra [name] MRN: [id number]  Date of Service: 04/05/2020  DOB: 04-11-1961  Post Treatment Telephone Note  Diagnosis:  Stage IIB, cT2N0 grade 3, weak ER positive, ER/HER2 negative (functional triple negative) invasive ductal carcinoma of the right breast.   Interval Since Last Radiation:  4 weeks   02/11/20-03/09/20: The right breast was treated ton 42.56 Gy in 16 fractions followed by an 8 Gy boost in 4 fractions.  Narrative:  The patient was contacted today for routine follow-up. During treatment she did very well with radiotherapy and did not have significant desquamation.   Impression/Plan: 1. Stage IIB, cT2N0 grade 3, weak ER positive, ER/HER2 negative (functional triple negative) invasive ductal carcinoma of the right breast.  I was unable to reach the patient but on voicemail I discussed that we would be happy to continue to follow her as needed, but she will also continue to follow up with Dr. Jana Hakim in medical oncology. She was counseled on skin care as well as measures to avoid sun exposure to this area.  2. Survivorship. I discussed the importance of survivorship evaluation and encouraged her to attend her upcoming visit with that clinic.    Carola Rhine, PAC

## 2020-04-05 NOTE — Op Note (Signed)
REMOVAL PORT-A-CATH  Procedure Note  Debra [name] 04/05/2020   Pre-op Diagnosis: HISTORY OF BREAST CANCER, PORT NO LONGER NEEDED     Post-op Diagnosis: same  Procedure(s): REMOVAL PORT-A-CATH  Surgeon(s): [name], MD  Anesthesia: Monitor Anesthesia Care  Staff:  Circulator: Maurene Capes, RN Scrub Person: Lorenza Burton, CST  Estimated Blood Loss: Minimal               Procedure: Patient brought to operating identifies correct patient.  She is placed upon the operating table and anesthesia was induced.  I anesthetized the skin over the port on the left chest with lidocaine.  I then made incision through previous scar with a scalpel.  I then dissected down to the port which was easily identified.  I removed both sutures and easily remove the port and the entire length of the catheter.  I then closed the catheter introduction site with a figure-of-eight 3-0 Vicryl suture.  I then closed subtenons tissue with interrupted 3-0 Vicryl sutures and closed skin with running 4-0 Monocryl.  Dermabond was then applied.  The patient tolerated the procedure well.  All the counts were correct at the end of the procedure.  The patient was then taken in stable addition from the operating room to the recovery room.          [name]   Date: 04/05/2020  Time: 3:25 PM

## 2020-04-06 ENCOUNTER — Ambulatory Visit (HOSPITAL_COMMUNITY): Payer: BC Managed Care – PPO | Attending: Family Medicine | Admitting: Physical Therapy

## 2020-04-06 ENCOUNTER — Encounter (HOSPITAL_BASED_OUTPATIENT_CLINIC_OR_DEPARTMENT_OTHER): Payer: Self-pay | Admitting: Surgery

## 2020-04-06 DIAGNOSIS — M25561 Pain in right knee: Secondary | ICD-10-CM | POA: Diagnosis not present

## 2020-04-06 DIAGNOSIS — M6281 Muscle weakness (generalized): Secondary | ICD-10-CM | POA: Diagnosis present

## 2020-04-06 DIAGNOSIS — R2689 Other abnormalities of gait and mobility: Secondary | ICD-10-CM | POA: Diagnosis present

## 2020-04-06 DIAGNOSIS — R29898 Other symptoms and signs involving the musculoskeletal system: Secondary | ICD-10-CM | POA: Diagnosis present

## 2020-04-06 DIAGNOSIS — M25562 Pain in left knee: Secondary | ICD-10-CM | POA: Insufficient documentation

## 2020-04-06 NOTE — Therapy (Signed)
[place], [place], [address] Phone: [phone]   Fax:  [phone]  Physical Therapy Evaluation  Patient Details  Name: Debra [name] MRN: [id number] Date of Birth: July 19, 1960 Referring Provider (PT): Yaakov Guthrie MD   Encounter Date: 04/06/2020   PT End of Session - 04/06/20 1611    Visit Number 1    Number of Visits 12    Date for PT Re-Evaluation 05/18/20    Authorization Type BCBS  (no auth, no vl)    PT Start Time 1534    PT Stop Time 1609    PT Time Calculation (min) 35 min    Activity Tolerance Patient tolerated treatment well    Behavior During Therapy [facility] for tasks assessed/performed           Past Medical History:  Diagnosis Date  . Cancer [facility])    right breast  . Family history of adverse reaction to anesthesia    Post-op N/V  . Family history of breast cancer   . Family history of colon cancer   . Family history of nonmelanoma skin cancer   . 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 ABDOMINAL HYSTERECTOMY    . BREAST LUMPECTOMY WITH RADIOACTIVE SEED AND SENTINEL LYMPH NODE BIOPSY Right 01/08/2020   Procedure: RIGHT BREAST LUMPECTOMY WITH RADIOACTIVE SEED AND SENTINEL LYMPH NODE BIOPSY;  Surgeon: [name], MD;  Location: [place];  Service: General;  Laterality: Right;  LMA, PECTORAL BLOCK  . COLONOSCOPY    . 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 PORT-A-CATH REMOVAL Left 04/05/2020   Procedure: REMOVAL PORT-A-CATH;  Surgeon: [name], MD;  Location: [place];  Service: General;  Laterality: Left;  . PORTACATH PLACEMENT Left 08/06/2019   Procedure: INSERTION PORT-A-CATH WITH ULTRASOUND GUIDANCE;  Surgeon: [name], MD;  Location: [place];  Service: General;  Laterality: Left;  . WISDOM TOOTH EXTRACTION        There were no vitals filed for this visit.    Subjective Assessment - 04/06/20 1541    Subjective Patient is a 59 y.o. female who presents to physical therapy with c/o bilateral knee pain. Her knees have been bothering her. She used to be a runner and run long distances. Her knees have been more painful. She was doing some leg exercises to try to help but it was not helping so she wanted to come to PT. Symptoms are worse with walking on incline/decline, stairs, after being immobile. She has not found anything that makes her knees better. Patient states her main goal is to learn exercises to get her knees back in shape.    Limitations Lifting;Walking    Currently in Pain? No/denies   worst with stairs             [facility] PT Assessment - 04/06/20 0001      Assessment   Medical Diagnosis bilateral Knee pain    Referring Provider (PT) Yaakov Guthrie MD    Onset Date/Surgical Date 04/06/19    Next MD Visit none scheduled    Prior Therapy none      Precautions   Precautions None      Restrictions   Weight Bearing Restrictions No      Balance Screen   Has the patient fallen in the past 6 months No  Has the patient had a decrease in activity level because of a fear of falling?  No    Is the patient reluctant to leave their home because of a fear of falling?  No      Prior Function   Level of Independence Independent    Vocation Full time employment    Museum/gallery curator      Cognition   Overall Cognitive Status Within Functional Limits for tasks assessed      Observation/Other Assessments   Observations Ambulates without AD    Focus on Therapeutic Outcomes (FOTO)  complete next session      ROM / Strength   AROM / PROM / Strength AROM;Strength      AROM   Overall AROM Comments knee flexion in seated    AROM Assessment Site Knee    Right/Left Knee Right;Left    Right Knee Extension 0    Right Knee Flexion 115    Left Knee Extension 0    Left Knee Flexion  115      Strength   Strength Assessment Site Hip;Knee;Ankle    Right/Left Hip Right;Left    Right Hip Flexion 4/5    Left Hip Flexion 4/5    Right/Left Knee Right;Left    Right Knee Flexion 5/5    Right Knee Extension 4+/5    Left Knee Flexion 5/5    Left Knee Extension 4/5    Right/Left Ankle Right;Left    Right Ankle Dorsiflexion 5/5    Left Ankle Dorsiflexion 5/5      Palpation   Palpation comment No tenderness throughout bilateral knees, pain typically superior patella      Transfers   Five time sit to stand comments  18.74 seconds without UE support                      Objective measurements completed on examination: See above findings.       [facility] Adult PT Treatment/Exercise - 04/06/20 0001      Ambulation/Gait   Stairs Yes    Stairs Assistance 7: Independent    Stair Management Technique No rails;Alternating pattern    Gait Comments decreased motor control L>R ascending, decreased eccentric control bilateral, painful      Exercises   Exercises Knee/Hip      Knee/Hip Exercises: Seated   Long Arc Quad Both;10 reps    Long Arc Quad Limitations 5-10 second holds                  PT Education - 04/06/20 1543    Education Details Patient educated on exam findings, POC, scope of PT    Person(s) Educated Patient    Methods Explanation;Demonstration    Comprehension Verbalized understanding;Returned demonstration            PT Short Term Goals - 04/06/20 1617      PT SHORT TERM GOAL #1   Title Patient will be independent with HEP in order to improve functional outcomes.    Time 3    Period Weeks    Status New    Target Date 04/27/20      PT SHORT TERM GOAL #2   Title Patient will report at least 25% improvement in symptoms for improved quality of life.    Time 3    Period Weeks    Status New    Target Date 04/27/20             PT Long  Term Goals - 04/06/20 1618      PT LONG TERM GOAL #1   Title Patient will report at  least 75% improvement in symptoms for improved quality of life.    Time 4    Period Weeks    Status New    Target Date 05/04/20      PT LONG TERM GOAL #2   Title Patient will improve FOTO score by at least 5 points in order to indicate improved tolerance to activity.    Time 6    Period Weeks    Status New    Target Date 05/18/20      PT LONG TERM GOAL #3   Title Patient will be able to complete 5x STS in under 11.4 seconds in order to reduce the risk of falls.    Time 6    Period Weeks    Status New    Target Date 05/18/20      PT LONG TERM GOAL #4   Title Patient will be able to navigate stairs with reciprocal pattern without compensation or pain in order to demonstrate improved LE strength.    Time 6    Period Weeks    Status New    Target Date 05/18/20                  Plan - 04/06/20 1612    Clinical Impression Statement Patient is a 59 y.o. female who presents to physical therapy with c/o bilateral knee pain. She presents with pain limited deficits in bilateral knee strength, ROM, gait, transfers, stairs, and functional mobility with ADL. She is having to modify and restrict ADL as indicated by subjective information and objective measures which is affecting overall participation. Patient will benefit from skilled physical therapy in order to improve function and reduce impairment.    Personal Factors and Comorbidities Age;Fitness;Comorbidity 1;Time since onset of injury/illness/exacerbation;Past/Current Experience;Behavior Pattern    Comorbidities hx cancer    Examination-Activity Limitations Transfers;Stand;Stairs;Squat;Locomotion Level    Examination-Participation Restrictions Occupation;Cleaning;Meal Prep;Yard Work;Volunteer;Shop;Interpersonal Relationship    Stability/Clinical Decision Making Stable/Uncomplicated    Clinical Decision Making Low    Rehab Potential Good    PT Frequency 2x / week    PT Duration 6 weeks    PT Treatment/Interventions ADLs/Self  Care Home Management;Aquatic Therapy;Biofeedback;Canalith Repostioning;Electrical Stimulation;Cryotherapy;Iontophoresis 4mg /ml Dexamethasone;Moist Heat;DME Instruction;Gait training;Stair training;Functional mobility training;Therapeutic activities;Therapeutic exercise;Balance training;Neuromuscular re-education;Patient/family education;Manual techniques;Dry needling;Energy conservation    PT Next Visit Plan complete FOTO, continue LE strengthing and progress as able with emphasis on quads and hips, functional strenthing with STS, squat, stairs, etc    PT Home Exercise Plan LAQ    Consulted and Agree with Plan of Care Patient           Patient will benefit from skilled therapeutic intervention in order to improve the following deficits and impairments:  Abnormal gait, Difficulty walking, Decreased range of motion, Decreased activity tolerance, Pain, Decreased balance, Improper body mechanics, Impaired flexibility, Decreased mobility, Decreased strength  Visit Diagnosis: Right knee pain, unspecified chronicity  Left knee pain, unspecified chronicity  Muscle weakness (generalized)  Other abnormalities of gait and mobility  Other symptoms and signs involving the musculoskeletal system     Problem List Patient Active Problem List   Diagnosis Date Noted  . Genetic testing 11/07/2019  . Family history of breast cancer   . Family history of colon cancer   . Family history of nonmelanoma skin cancer   . Chemotherapy induced neutropenia ([place]) 08/15/2019  .  Malignant neoplasm of upper-inner quadrant of right breast in female, estrogen receptor positive ([place]) 07/23/2019    4:21 PM, 04/06/20 Mearl Latin PT, DPT Physical Therapist at [place], [place], [address] Phone: [phone]   Fax:  [phone]  Name: Debra [name] MRN: [id number] Date of Birth: 09-11-1960

## 2020-04-06 NOTE — Patient Instructions (Signed)
Access Code: [email or web address] URL: [email or web address] Date: 04/06/2020 Prepared by: [facility] Danah Reinecke  Exercises Seated Long Arc Quad - 1 x daily - 7 x weekly - 10 reps - 10 second hold

## 2020-04-13 ENCOUNTER — Ambulatory Visit (HOSPITAL_COMMUNITY): Payer: BC Managed Care – PPO | Admitting: Physical Therapy

## 2020-04-14 ENCOUNTER — Inpatient Hospital Stay: Payer: BC Managed Care – PPO

## 2020-04-14 ENCOUNTER — Ambulatory Visit: Payer: BC Managed Care – PPO | Admitting: Oncology

## 2020-04-14 ENCOUNTER — Other Ambulatory Visit: Payer: BC Managed Care – PPO

## 2020-04-15 ENCOUNTER — Ambulatory Visit (HOSPITAL_COMMUNITY): Payer: BC Managed Care – PPO | Attending: Family Medicine

## 2020-04-15 ENCOUNTER — Encounter (HOSPITAL_COMMUNITY): Payer: Self-pay

## 2020-04-15 ENCOUNTER — Other Ambulatory Visit: Payer: Self-pay

## 2020-04-15 DIAGNOSIS — R2689 Other abnormalities of gait and mobility: Secondary | ICD-10-CM | POA: Diagnosis present

## 2020-04-15 DIAGNOSIS — M25561 Pain in right knee: Secondary | ICD-10-CM | POA: Diagnosis not present

## 2020-04-15 DIAGNOSIS — R29898 Other symptoms and signs involving the musculoskeletal system: Secondary | ICD-10-CM | POA: Insufficient documentation

## 2020-04-15 DIAGNOSIS — M25562 Pain in left knee: Secondary | ICD-10-CM | POA: Diagnosis present

## 2020-04-15 DIAGNOSIS — M6281 Muscle weakness (generalized): Secondary | ICD-10-CM | POA: Insufficient documentation

## 2020-04-15 NOTE — Therapy (Signed)
[place], [place], [address] Phone: [phone]   Fax:  [phone]  Physical Therapy Treatment  Patient Details  Name: Debra [name] MRN: [id number] Date of Birth: [birth year] Referring Provider (PT): Yaakov Guthrie MD   Encounter Date: 04/15/2020   PT End of Session - 04/15/20 1714    Visit Number 2    Number of Visits 12    Date for PT Re-Evaluation 05/18/20    Authorization Type BCBS  (no auth, no vl)    PT Start Time 1502    PT Stop Time 1544    PT Time Calculation (min) 42 min    Activity Tolerance Patient tolerated treatment well    Behavior During Therapy [facility] for tasks assessed/performed           Past Medical History:  Diagnosis Date  . Cancer [facility])    right breast  . Family history of adverse reaction to anesthesia    Post-op N/V  . Family history of breast cancer   . Family history of colon cancer   . Family history of nonmelanoma skin cancer   . 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 ABDOMINAL HYSTERECTOMY    . BREAST LUMPECTOMY WITH RADIOACTIVE SEED AND SENTINEL LYMPH NODE BIOPSY Right 01/08/2020   Procedure: RIGHT BREAST LUMPECTOMY WITH RADIOACTIVE SEED AND SENTINEL LYMPH NODE BIOPSY;  Surgeon: [name], MD;  Location: [place];  Service: General;  Laterality: Right;  LMA, PECTORAL BLOCK  . COLONOSCOPY    . 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 PORT-A-CATH REMOVAL Left 04/05/2020   Procedure: REMOVAL PORT-A-CATH;  Surgeon: [name], MD;  Location: [place];  Service: General;  Laterality: Left;  . PORTACATH PLACEMENT Left 08/06/2019   Procedure: INSERTION PORT-A-CATH WITH ULTRASOUND GUIDANCE;  Surgeon: [name], MD;  Location: [place];  Service: General;  Laterality: Left;  . WISDOM TOOTH EXTRACTION       There were no vitals filed for this visit.   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 Patient underwent neoadjuvant chemotherapy from 08/07/2019-12/03/2019. She had a right lumpectomy and sentinel node biopsy (1 negative node) on 01/08/2020.    Currently in Pain? Yes    Pain Score 3     Pain Location Knee    Pain Orientation Right;Left    Pain Descriptors / Indicators Aching;Sore    Pain Type Chronic pain    Pain Onset More than a month ago    Pain Frequency Intermittent    Aggravating Factors  active movements    Pain Relieving Factors knee brace              [facility] PT Assessment - 04/15/20 0001      Assessment   Medical Diagnosis bilateral Knee pain    Referring Provider (PT) Yaakov Guthrie MD    Onset Date/Surgical Date 04/06/19    Next MD Visit none scheduled    Prior Therapy none      Observation/Other Assessments   Focus on Therapeutic Outcomes (FOTO)  68.8728% functional  [facility] Adult PT Treatment/Exercise - 04/15/20 0001      Exercises   Exercises Knee/Hip      Knee/Hip Exercises: Aerobic   Stationary Bike 3' on bike at [employer] for mobility      Knee/Hip Exercises: Standing   Heel Raises 15 reps    Heel Raises Limitations minimal hand use    Lateral Step Up Both;10 reps;Hand Hold: 1;Step Height: 4"    Functional Squat 10 reps    Functional Squat Limitations chair behinds    SLS Lt 19", Rt 30"    SLS with Vectors 2x 5" intermittent HHA      Knee/Hip Exercises: Seated   Sit to Sand 10 reps;without UE support                  PT Education - 04/15/20 1715    Education Details Reviewed goals, educated importance of HEP which was established this session for functional strengthening.   FOTO complete was 68.8728% functional.    Person(s) Educated Patient    Methods Explanation;Demonstration;Handout    Comprehension Verbalized understanding;Returned demonstration            PT Short Term Goals - 04/06/20 1617      PT SHORT TERM GOAL #1   Title Patient will be independent with HEP in order to improve functional outcomes.    Time 3    Period Weeks    Status New    Target Date 04/27/20      PT SHORT TERM GOAL #2   Title Patient will report at least 25% improvement in symptoms for improved quality of life.    Time 3    Period Weeks    Status New    Target Date 04/27/20             PT Long Term Goals - 04/06/20 1618      PT LONG TERM GOAL #1   Title Patient will report at least 75% improvement in symptoms for improved quality of life.    Time 4    Period Weeks    Status New    Target Date 05/04/20      PT LONG TERM GOAL #2   Title Patient will improve FOTO score by at least 5 points in order to indicate improved tolerance to activity.    Time 6    Period Weeks    Status New    Target Date 05/18/20      PT LONG TERM GOAL #3   Title Patient will be able to complete 5x STS in under 11.4 seconds in order to reduce the risk of falls.    Time 6    Period Weeks    Status New    Target Date 05/18/20      PT LONG TERM GOAL #4   Title Patient will be able to navigate stairs with reciprocal pattern without compensation or pain in order to demonstrate improved LE strength.    Time 6    Period Weeks    Status New    Target Date 05/18/20                 Plan - 04/15/20 1806    Clinical Impression Statement Reviewed goals, educated importance of HEP compliance that was established this session.  FOTO complete with 31% impairements indication decreased subjective self perceived functional abilities.  Session focus on functional strengthening.  Added balance activities with noted hip instability.  Added STS, heel/toe raises, SLS and vector  stance to  HEP with ability to verbalize and demonstrate appropriate mechanics, printout given for follow-thru.    Personal Factors and Comorbidities Age;Fitness;Comorbidity 1;Time since onset of injury/illness/exacerbation;Past/Current Experience;Behavior Pattern    Comorbidities hx cancer    Examination-Activity Limitations Transfers;Stand;Stairs;Squat;Locomotion Level    Examination-Participation Restrictions Occupation;Cleaning;Meal Prep;Yard Work;Volunteer;Shop;Interpersonal Relationship    Stability/Clinical Decision Making Stable/Uncomplicated    Clinical Decision Making Low    Rehab Potential Good    PT Frequency 2x / week    PT Duration 6 weeks    PT Treatment/Interventions ADLs/Self Care Home Management;Aquatic Therapy;Biofeedback;Canalith Repostioning;Electrical Stimulation;Cryotherapy;Iontophoresis 4mg /ml Dexamethasone;Moist Heat;DME Instruction;Gait training;Stair training;Functional mobility training;Therapeutic activities;Therapeutic exercise;Balance training;Neuromuscular re-education;Patient/family education;Manual techniques;Dry needling;Energy conservation    PT Next Visit Plan Next session begin forward step up and sidestep, progress to lunges.  Continue LE strengthing and progress as able with emphasis on quads and hips, functional strenthing with STS, squat, stairs, etc;    PT Home Exercise Plan LAQ; 12/9: heel/toe raises, STS, SLS, vector stance           Patient will benefit from skilled therapeutic intervention in order to improve the following deficits and impairments:  Abnormal gait,Difficulty walking,Decreased range of motion,Decreased activity tolerance,Pain,Decreased balance,Improper body mechanics,Impaired flexibility,Decreased mobility,Decreased strength  Visit Diagnosis: Right knee pain, unspecified chronicity  Left knee pain, unspecified chronicity  Muscle weakness (generalized)  Other abnormalities of gait and mobility     Problem List Patient Active Problem  List   Diagnosis Date Noted  . Genetic testing 11/07/2019  . Family history of breast cancer   . Family history of colon cancer   . Family history of nonmelanoma skin cancer   . Chemotherapy induced neutropenia ([place]) 08/15/2019  . Malignant neoplasm of upper-inner quadrant of right breast in female, estrogen receptor positive ([place]) 07/23/2019   Ihor Austin, LPTA/CLT; CBIS [phone]  Aldona Lento 04/15/2020, 6:38 PM  [facility] [address] [place], [place], [address] Phone: [phone]   Fax:  [phone]  Name: Andra Heslin MRN: [id number] Date of Birth: February 28, 1961

## 2020-04-15 NOTE — Progress Notes (Signed)
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   Since her last visit, she was referred back to Dr. Lisbeth Renshaw on 02/03/2020 to discuss radiation therapy. She subsequently received treatment from 02/12/2020 through 03/09/2020.  She tolerated this generally well, with no significant side effects in particular no severe skin changes or fatigue.  She had her port removed on 04/05/2020.  This was uneventful.   REVIEW OF SYSTEMS: Debra [name] is working full-time.  She is on her feet all day.  She does not otherwise exercise.  She feels she is slowly getting back to her baseline and is looking forward to the holidays.  Detailed review of systems was otherwise noncontributory   COVID 19 VACCINATION STATUS: Status post Pfizer x2 most recently March 2021, with no booster as of December 2021   HISTORY OF CURRENT ILLNESS: From the original intake note:  Debra [name] herself palpated a mass in the upper-inner right breast in February 2021.  It appeared to increase in size over the next 3 weeks. Physical exam performed at [facility] 07/15/2019 confirmed a 6 mm firm, oval, palpable mass in the upper-inner right breast. She underwent bilateral diagnostic mammography with tomography and bilateral breast  ultrasonography on 07/15/2019 showing: breast density category C; 4.6 cm mass in the right breast at 1 o'clock; single abnormal-appearing lymph node; bilateral benign cysts.  Accordingly on 07/21/2019 she proceeded to biopsy of the right breast area in question. 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 Prognostic indicators significant for: estrogen receptor, 60% positive with weak staining intensity and progesterone receptor, 0% negative. Proliferation marker Ki67 at 90%. HER2 equivocal by immunohistochemistry (2+), but negative by fluorescent in situ hybridization with a signals ratio 1.24 and number per cell 2.55.  The questionable right axillary lymph node was biopsied as well and was benign (concordant).  The patient's subsequent history is as detailed below.   PAST MEDICAL HISTORY: Past Medical History:  Diagnosis Date  . Cancer [facility])    right breast  . Family history of adverse reaction to anesthesia    Post-op N/V  . Family history of breast cancer   . Family history of colon cancer   . Family history of nonmelanoma skin cancer   . 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 ABDOMINAL HYSTERECTOMY    . BREAST LUMPECTOMY WITH RADIOACTIVE SEED AND SENTINEL LYMPH NODE BIOPSY Right 01/08/2020   Procedure: RIGHT BREAST LUMPECTOMY WITH RADIOACTIVE SEED AND SENTINEL LYMPH NODE BIOPSY;  Surgeon: [name], MD;  Location: [facility];  Service: General;  Laterality: Right;  LMA, PECTORAL BLOCK  . COLONOSCOPY    . 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 PORT-A-CATH REMOVAL Left 04/05/2020  Procedure: REMOVAL PORT-A-CATH;  Surgeon: [name], MD;  Location: [facility];  Service: General;  Laterality: Left;  . PORTACATH PLACEMENT  Left 08/06/2019   Procedure: INSERTION PORT-A-CATH WITH ULTRASOUND GUIDANCE;  Surgeon: [name], MD;  Location: [facility];  Service: General;  Laterality: Left;  . WISDOM TOOTH EXTRACTION    Status post bilateral salpingo-oophorectomy   FAMILY HISTORY: Family History  Problem Relation Age of Onset  . Skin cancer Sister        non-melanoma dx. in her 16s  . Other Sister        normal genetic testing for hereditary cancer risks  . Colon cancer Paternal Grandfather        dx. in his 35s  . Wilm's tumor Son        dx. 9 months  . Breast cancer Other        dx. in her 66s (PGM's sister)  . Wilm's tumor Nephew 2  . Breast cancer Other        dx. in her 62s (PGM's niece)    Her father is 47 years old as of 07/2019. Her mother was murdered at age 20 (domestic violence).  The patient has 3 sisters and no brothers. She reports a third cousin (grandmother's sister's daughter) with breast cancer in her 64's. She denies a family history of ovarian, prostate, or pancreatic cancer. She does report colon cancer in her paternal grandfather in his 55's, non-melanoma skin cancer in her sister in her 95's, and Wilms tumor in her son at 71 months.   GYNECOLOGIC HISTORY:  Patient's last menstrual period was 05/09/2011. Menarche: 72-59 years old Age at first live birth: 59 years old [place] P 3 LMP 2013 Contraceptive never used HRT used for approximately 9 months  Hysterectomy? Yes, 2013 BSO? yes   SOCIAL HISTORY: (updated 07/2019)  Debra [name] works as a Scientist, research (physical sciences). Husband Debra [name] is an Art gallery manager. She lives at home with husband Debra [name] Daughter Debra [name], age 81, is a poet and Pharmacist, hospital in [place], [place]. Son Debra [name], age 49, is an Production designer, theatre/television/film in [place] in [place], [place]. Son Debra [name], age 63, is a Marketing executive working in Artist in [place], [place] (at the [employer]). [name] has three grandchildren. She is a  Media planner.    ADVANCED DIRECTIVES: In the absence of any documentation to the contrary, the patient's spouse is their HCPOA.    HEALTH MAINTENANCE: Social History   Tobacco Use  . Smoking status: Former Smoker    Packs/day: 1.00    Years: 10.00    Pack years: 10.00    Types: Cigarettes    Quit date: 1992    Years since quitting: 29.9  . Smokeless tobacco: Never Used  Vaping Use  . Vaping Use: Never used  Substance Use Topics  . Alcohol use: Yes    Alcohol/week: 2.0 standard drinks    Types: 2 Glasses of wine per week    Comment: occassionally  . Drug use: Never     Colonoscopy: none on file  PAP: none on file (s/p hysterectomy)  Bone density: n/a (age)   Allergies  Allergen Reactions  . Other Rash    Walnuts give her a rash       Current Outpatient Medications  Medication Sig Dispense Refill  . b complex vitamins capsule Take 1 capsule by mouth daily.    . Multiple Vitamin (MULTI-VITAMIN DAILY PO) Take 1 tablet by mouth daily.  No current facility-administered medications for this visit.   Facility-Administered Medications Ordered in Other Visits  Medication Dose Route Frequency Provider Last Rate Last Admin  . heparin lock flush 100 unit/mL  500 Units Intracatheter Once PRN Yeni Jiggetts, [name], MD      . sodium chloride flush (NS) 0.9 % injection 10 mL  10 mL Intracatheter PRN Emmalia Heyboer, [name], MD        OBJECTIVE: White woman in no acute distress  Vitals:   04/16/20 1159  BP: (!) 127/53  Pulse: 72  Resp: 18  Temp: 98.2 F (36.8 C)  SpO2: 100%     Body mass index is 29.86 kg/m.   Wt Readings from Last 3 Encounters:  04/16/20 208 lb 1.6 oz (94.4 kg)  04/05/20 207 lb 7.3 oz (94.1 kg)  01/22/20 201 lb 11.2 oz (91.5 kg)     ECOG FS:1 - Symptomatic but completely ambulatory  Sclerae unicteric, EOMs intact Wearing a mask No cervical or supraclavicular adenopathy Lungs no rales or rhonchi Heart regular rate and rhythm Abd  soft, nontender, positive bowel sounds MSK no focal spinal tenderness, no upper extremity lymphedema Neuro: nonfocal, well oriented, appropriate affect Breasts: The right breast is status post lumpectomy and radiation.  There is minimal hyperpigmentation.  There is no evidence of recurrent or residual disease.  Left breast is benign.  Both axillae are benign.   LAB RESULTS:  CMP     Component Value Date/Time   NA 143 04/16/2020 1131   K 4.2 04/16/2020 1131   CL 108 04/16/2020 1131   CO2 26 04/16/2020 1131   GLUCOSE 91 04/16/2020 1131   BUN 15 04/16/2020 1131   CREATININE 0.75 04/16/2020 1131   CREATININE 0.76 07/30/2019 0825   CALCIUM 9.7 04/16/2020 1131   PROT 6.9 04/16/2020 1131   ALBUMIN 3.8 04/16/2020 1131   AST 21 04/16/2020 1131   AST 14 (L) 07/30/2019 0825   ALT 21 04/16/2020 1131   ALT 11 07/30/2019 0825   ALKPHOS 107 04/16/2020 1131   BILITOT 0.3 04/16/2020 1131   BILITOT 0.4 07/30/2019 0825   GFRNONAA >60 04/16/2020 1131   GFRNONAA >60 07/30/2019 0825   GFRAA >60 01/07/2020 1055   GFRAA >60 07/30/2019 0825    No results found for: TOTALPROTELP, ALBUMINELP, A1GS, A2GS, BETS, BETA2SER, GAMS, MSPIKE, SPEI  Lab Results  Component Value Date   WBC 5.7 04/16/2020   NEUTROABS 3.5 04/16/2020   HGB 11.7 (L) 04/16/2020   HCT 35.3 (L) 04/16/2020   MCV 95.7 04/16/2020   PLT 223 04/16/2020    No results found for: LABCA2  No components found for: WCHENI778  No results for input(s): INR in the last 168 hours.  No results found for: LABCA2  No results found for: EUM353  No results found for: IRW431  No results found for: VQM086  No results found for: CA2729  No components found for: HGQUANT  No results found for: CEA1 / No results found for: CEA1   No results found for: AFPTUMOR  No results found for: CHROMOGRNA  No results found for: KPAFRELGTCHN, LAMBDASER, KAPLAMBRATIO (kappa/lambda light chains)  No results found for: HGBA, HGBA2QUANT,  HGBFQUANT, HGBSQUAN (Hemoglobinopathy evaluation)   No results found for: LDH  No results found for: IRON, TIBC, IRONPCTSAT (Iron and TIBC)  No results found for: FERRITIN  Urinalysis    Component Value Date/Time   COLORURINE YELLOW 11/26/2019 [place] 11/26/2019 1319   LABSPEC 1.030 11/26/2019 1319  PHURINE 5.0 11/26/2019 1319   GLUCOSEU NEGATIVE 11/26/2019 1319   HGBUR NEGATIVE 11/26/2019 1319   BILIRUBINUR NEGATIVE 11/26/2019 1319   KETONESUR NEGATIVE 11/26/2019 1319   PROTEINUR NEGATIVE 11/26/2019 1319   NITRITE NEGATIVE 11/26/2019 1319   LEUKOCYTESUR TRACE (A) 11/26/2019 1319    STUDIES: No results found.   ELIGIBLE FOR AVAILABLE RESEARCH PROTOCOL: AET  ASSESSMENT: 59 y.o. 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), with an MIB-1 of 90%  (1) neoadjuvant chemotherapy consisting of cyclophosphamide and doxorubicin in dose dense fashion x4 started 08/07/2019, completed 09/18/2019, followed by paclitaxel and carboplatin weekly started 10/08/2019  (a) echo 08/05/2019 showed an ejection fraction in the 60-65% range  (b) paclitaxel/carboplatinum discontinued after 9 doses (12/03/2019) secondary to grade 1 peripheral neuropathy  (2) status post right lumpectomy and sentinel lymph node sampling 01/08/2020 showing a complete pathologic response (ypT0 ypN0)  (a) a single right axillary lymph node was removed  (3) adjuvant radiation 02/12/2020 through 03/09/2020  (4) genetics testing 11/06/2019 through the [facility] Multi-Cancer Panel + Wilms Tumor Panel found no deleterious mutations in AIP, ALK, APC, ATM, AXIN2,BAP1,  BARD1, BLM, BMPR1A, BRCA1, BRCA2, BRIP1, CASR, CDC73, CDH1, CDK4, CDKN1B, CDKN1C, CDKN2A (p14ARF), CDKN2A (p16INK4a), CEBPA, CHEK2, CTNNA1, DICER1, DIS3L2, EGFR (c.2369C>T, p.Thr790Met variant  only), EPCAM (Deletion/duplication testing only), FH, FLCN, GATA2, GPC3, GREM1 (Promoter region deletion/duplication testing only), HOXB13 (c.251G>A, p.Gly84Glu), HRAS, KIT, MAX, MEN1, MET, MITF (c.952G>A, p.Glu318Lys variant only), MLH1, MSH2, MSH3, MSH6, MUTYH, NBN, NF1, NF2, NTHL1, PALB2, PDGFRA, PHOX2B, PMS2, POLD1, POLE, POT1, PRKAR1A, PTCH1, PTEN, RAD50, RAD51C, RAD51D, RB1, RECQL4, RET, RNF43, RUNX1, SDHAF2, SDHA (sequence changes only), SDHB, SDHC, SDHD, SMAD4, SMARCA4, SMARCB1, SMARCE1, STK11, SUFU, TERC, TERT, TMEM127, TP53, TSC1, TSC2, VHL, WRN and WT1. The Wilms Tumor Panel offered by [facility] includes sequencing and deletion/duplication testing of the following 7 genes: CDC73, CDKN1C, CTR9, DIS3L2, GPC3, REST, and WT1.  (a) Three variants of uncertain significance were detected - one in the AXIN2 gene called c.13A>G, one in the RNF43 gene called c.1770G>C, and one in the [facility] gene called c.85C>G.   (5) adjuvant antiestrogens discussed but opted against (see note on 01/22/2020)   PLAN: Aslee has completed the treatment for her breast cancer.  She is working back to her baseline and I have encouraged her to exercise on a regular basis in addition to the walking that she does at school.  Aside from the usual soreness, sensitivity, and occasional shooting pains in the surgical breast she has no symptoms are related to her disease.  She is of course undergoing the psychological and spiritual changes that so many of my patients undergo after this life-threatening experience.  This is frequently a mild form of posttraumatic stress.  She is very self aware, very appreciative of her husband who has been supportive, and would know how to seek for help if she needed to.  I encouraged her to continue to explore how she feels with her husband, with friends, and if she finds that she develops intrusive or negative thoughts to let me know so that we can assist  Otherwise she will have mammography in May  and I will see her shortly after that  She knows to call for any other issue that may develop before that visit  Total encounter time 25 minutes.Sarajane Jews C. Jaslyn Bansal, MD 04/17/20 9:06 AM Medical Oncology and Hematology [place]  [place], [place] [address] Tel. [phone]    Fax. [phone]   I, Wilburn Mylar, am acting as scribe for Dr. [name]. Story Vanvranken.  I, Lurline Del MD, have reviewed the above documentation for accuracy and completeness, and I agree with the above.   *Total Encounter Time as defined by the Centers for Medicare and Medicaid Services includes, in addition to the face-to-face time of a patient visit (documented in the note above) non-face-to-face time: obtaining and reviewing outside history, ordering and reviewing medications, tests or procedures, care coordination (communications with other health care professionals or caregivers) and documentation in the medical record.

## 2020-04-15 NOTE — Patient Instructions (Addendum)
Functional Quadriceps: Sit to Stand    Sit on edge of chair, feet flat on floor. Stand upright, extending knees fully. Repeat 10 times per set. Do 2 sets per day.  http://orth.exer.us/735   Copyright  VHI. All rights reserved.   Toe / Heel Raise (Standing)    Standing with support, raise heels, then rock back on heels and raise toes. Repeat 15 times.  Copyright  VHI. All rights reserved.   Single Leg Balance: Eyes Open    Stand on right leg with eyes open. Hold 60 seconds. 3 reps; 4 times a week.  .  http://ggbe.exer.us/5   Copyright  VHI. All rights reserved.    HIP: Flexion / KNEE: Extension, Heel Strike - Standing      Standing tall bring leg forward, side and then straight back.  Hold all positions 5" holds and increase to 10" as increase ease.   Repeat 3 times each leg.  Complete 4 days a week.

## 2020-04-16 ENCOUNTER — Inpatient Hospital Stay: Payer: BC Managed Care – PPO | Attending: Oncology

## 2020-04-16 ENCOUNTER — Inpatient Hospital Stay (HOSPITAL_BASED_OUTPATIENT_CLINIC_OR_DEPARTMENT_OTHER): Payer: BC Managed Care – PPO | Admitting: Oncology

## 2020-04-16 ENCOUNTER — Inpatient Hospital Stay: Payer: BC Managed Care – PPO

## 2020-04-16 ENCOUNTER — Telehealth: Payer: Self-pay | Admitting: Oncology

## 2020-04-16 ENCOUNTER — Other Ambulatory Visit: Payer: Self-pay

## 2020-04-16 VITALS — BP 127/53 | HR 72 | Temp 98.2°F | Resp 18 | Ht 70.0 in | Wt 208.1 lb

## 2020-04-16 DIAGNOSIS — Z170 Estrogen receptor positive status [ER+]: Secondary | ICD-10-CM

## 2020-04-16 DIAGNOSIS — Z800 Family history of malignant neoplasm of digestive organs: Secondary | ICD-10-CM | POA: Diagnosis not present

## 2020-04-16 DIAGNOSIS — Z171 Estrogen receptor negative status [ER-]: Secondary | ICD-10-CM | POA: Diagnosis not present

## 2020-04-16 DIAGNOSIS — Z803 Family history of malignant neoplasm of breast: Secondary | ICD-10-CM | POA: Insufficient documentation

## 2020-04-16 DIAGNOSIS — C50211 Malignant neoplasm of upper-inner quadrant of right female breast: Secondary | ICD-10-CM | POA: Diagnosis not present

## 2020-04-16 DIAGNOSIS — Z87891 Personal history of nicotine dependence: Secondary | ICD-10-CM | POA: Insufficient documentation

## 2020-04-16 DIAGNOSIS — Z923 Personal history of irradiation: Secondary | ICD-10-CM | POA: Insufficient documentation

## 2020-04-16 DIAGNOSIS — G629 Polyneuropathy, unspecified: Secondary | ICD-10-CM | POA: Diagnosis not present

## 2020-04-16 LAB — CBC WITH DIFFERENTIAL/PLATELET
Abs Immature Granulocytes: 0.01 10*3/uL (ref 0.00–0.07)
Basophils Absolute: 0 10*3/uL (ref 0.0–0.1)
Basophils Relative: 1 %
Eosinophils Absolute: 0.3 10*3/uL (ref 0.0–0.5)
Eosinophils Relative: 5 %
HCT: 35.3 % — ABNORMAL LOW (ref 36.0–46.0)
Hemoglobin: 11.7 g/dL — ABNORMAL LOW (ref 12.0–15.0)
Immature Granulocytes: 0 %
Lymphocytes Relative: 23 %
Lymphs Abs: 1.3 10*3/uL (ref 0.7–4.0)
MCH: 31.7 pg (ref 26.0–34.0)
MCHC: 33.1 g/dL (ref 30.0–36.0)
MCV: 95.7 fL (ref 80.0–100.0)
Monocytes Absolute: 0.6 10*3/uL (ref 0.1–1.0)
Monocytes Relative: 10 %
Neutro Abs: 3.5 10*3/uL (ref 1.7–7.7)
Neutrophils Relative %: 61 %
Platelets: 223 10*3/uL (ref 150–400)
RBC: 3.69 MIL/uL — ABNORMAL LOW (ref 3.87–5.11)
RDW: 11.7 % (ref 11.5–15.5)
WBC: 5.7 10*3/uL (ref 4.0–10.5)
nRBC: 0 % (ref 0.0–0.2)

## 2020-04-16 LAB — COMPREHENSIVE METABOLIC PANEL
ALT: 21 U/L (ref 0–44)
AST: 21 U/L (ref 15–41)
Albumin: 3.8 g/dL (ref 3.5–5.0)
Alkaline Phosphatase: 107 U/L (ref 38–126)
Anion gap: 9 (ref 5–15)
BUN: 15 mg/dL (ref 6–20)
CO2: 26 mmol/L (ref 22–32)
Calcium: 9.7 mg/dL (ref 8.9–10.3)
Chloride: 108 mmol/L (ref 98–111)
Creatinine, Ser: 0.75 mg/dL (ref 0.44–1.00)
GFR, Estimated: >60 mL/min (ref >60)
Glucose, Bld: 91 mg/dL (ref 70–99)
Potassium: 4.2 mmol/L (ref 3.5–5.1)
Sodium: 143 mmol/L (ref 135–145)
Total Bilirubin: 0.3 mg/dL (ref 0.3–1.2)
Total Protein: 6.9 g/dL (ref 6.5–8.1)

## 2020-04-16 MED ORDER — SODIUM CHLORIDE 0.9% FLUSH
10.0000 mL | INTRAVENOUS | Status: AC | PRN
  Filled 2020-04-16: qty 10

## 2020-04-16 MED ORDER — HEPARIN SOD (PORK) LOCK FLUSH 100 UNIT/ML IV SOLN
500.0000 [IU] | Freq: Once | INTRAVENOUS | Status: AC | PRN
  Filled 2020-04-16: qty 5

## 2020-04-16 NOTE — Telephone Encounter (Signed)
Scheduled appt per 12/10 los - pt is aware of appt - gave patient AVS and calender

## 2020-04-19 ENCOUNTER — Other Ambulatory Visit: Payer: BC Managed Care – PPO

## 2020-04-19 ENCOUNTER — Ambulatory Visit: Payer: BC Managed Care – PPO | Admitting: Oncology

## 2020-04-20 ENCOUNTER — Encounter (HOSPITAL_COMMUNITY): Payer: BC Managed Care – PPO

## 2020-04-21 ENCOUNTER — Ambulatory Visit (HOSPITAL_COMMUNITY): Payer: BC Managed Care – PPO | Admitting: Physical Therapy

## 2020-04-21 ENCOUNTER — Other Ambulatory Visit: Payer: Self-pay

## 2020-04-21 DIAGNOSIS — M25561 Pain in right knee: Secondary | ICD-10-CM | POA: Diagnosis not present

## 2020-04-21 DIAGNOSIS — M6281 Muscle weakness (generalized): Secondary | ICD-10-CM

## 2020-04-21 DIAGNOSIS — M25562 Pain in left knee: Secondary | ICD-10-CM

## 2020-04-21 DIAGNOSIS — R2689 Other abnormalities of gait and mobility: Secondary | ICD-10-CM

## 2020-04-21 NOTE — Therapy (Signed)
[facility] [place], [place], [address] Phone: [phone]   Fax:  [phone]  Physical Therapy Treatment  Patient Details  Name: Debra [name] MRN: [id number] Date of Birth: 1960/07/04 Referring Provider (PT): Yaakov Guthrie MD   Encounter Date: 04/21/2020   PT End of Session - 04/21/20 1654    Visit Number 3    Number of Visits 12    Date for PT Re-Evaluation 05/18/20    Authorization Type BCBS  (no auth, no vl)    PT Start Time 1620    PT Stop Time 1705    PT Time Calculation (min) 45 min    Activity Tolerance Patient tolerated treatment well    Behavior During Therapy [facility] for tasks assessed/performed           Past Medical History:  Diagnosis Date  . Cancer [facility])    right breast  . Family history of adverse reaction to anesthesia    Post-op N/V  . Family history of breast cancer   . Family history of colon cancer   . Family history of nonmelanoma skin cancer   . 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 ABDOMINAL HYSTERECTOMY    . BREAST LUMPECTOMY WITH RADIOACTIVE SEED AND SENTINEL LYMPH NODE BIOPSY Right 01/08/2020   Procedure: RIGHT BREAST LUMPECTOMY WITH RADIOACTIVE SEED AND SENTINEL LYMPH NODE BIOPSY;  Surgeon: [name], MD;  Location: [facility];  Service: General;  Laterality: Right;  LMA, PECTORAL BLOCK  . COLONOSCOPY    . 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 PORT-A-CATH REMOVAL Left 04/05/2020   Procedure: REMOVAL PORT-A-CATH;  Surgeon: [name], MD;  Location: [facility];  Service: General;  Laterality: Left;  . PORTACATH PLACEMENT Left 08/06/2019   Procedure: INSERTION PORT-A-CATH WITH ULTRASOUND GUIDANCE;  Surgeon: [name], MD;  Location: [facility];  Service: General;  Laterality: Left;  . WISDOM TOOTH EXTRACTION       There were no vitals filed for this visit.   Subjective Assessment - 04/21/20 1635    Subjective pt reports no pain or issues today. States she was sore after last session.    Currently in Pain? No/denies                             [facility] Adult PT Treatment/Exercise - 04/21/20 0001      Knee/Hip Exercises: Aerobic   Stationary Bike 5' on bike at EOS for mobility      Knee/Hip Exercises: Standing   Heel Raises 15 reps    Heel Raises Limitations minimal hand use    Forward Lunges Both;15 reps    Forward Lunges Limitations onto 4" step no UE    Hip Abduction Both;15 reps    Hip Extension Both;15 reps    Lateral Step Up Left;Step Height: 2";Right;Step Height: 4";Hand Hold: 1;15 reps    Lateral Step Up Limitations 2" Lt, 4" Rt    Forward Step Up Both;15 reps;Step Height: 4"    Forward Step Up Limitations with 1 UE    Step Down Left;Step Height: 2";Right;Step Height: 4";Hand Hold: 1;15 reps    Step Down Limitations Lt 2", Rt 4"    Functional Squat 15 reps    SLS on foam max of 5 Lt ", Rt 14"  SLS with Vectors 5x 5" intermittent HHA bilaterally      Knee/Hip Exercises: Seated   Sit to Sand 10 reps;without UE support                    PT Short Term Goals - 04/06/20 1617      PT SHORT TERM GOAL #1   Title Patient will be independent with HEP in order to improve functional outcomes.    Time 3    Period Weeks    Status New    Target Date 04/27/20      PT SHORT TERM GOAL #2   Title Patient will report at least 25% improvement in symptoms for improved quality of life.    Time 3    Period Weeks    Status New    Target Date 04/27/20             PT Long Term Goals - 04/06/20 1618      PT LONG TERM GOAL #1   Title Patient will report at least 75% improvement in symptoms for improved quality of life.    Time 4    Period Weeks    Status New    Target Date 05/04/20      PT LONG TERM GOAL #2   Title Patient will improve FOTO score by  at least 5 points in order to indicate improved tolerance to activity.    Time 6    Period Weeks    Status New    Target Date 05/18/20      PT LONG TERM GOAL #3   Title Patient will be able to complete 5x STS in under 11.4 seconds in order to reduce the risk of falls.    Time 6    Period Weeks    Status New    Target Date 05/18/20      PT LONG TERM GOAL #4   Title Patient will be able to navigate stairs with reciprocal pattern without compensation or pain in order to demonstrate improved LE strength.    Time 6    Period Weeks    Status New    Target Date 05/18/20                 Plan - 04/21/20 1657    Clinical Impression Statement Continued with focus on improving bilateral LE strength and stability. Progressed with step ups/downs, forward lunges and further hip strengthening exercises.  Pt unable to complete heel taps with 4" step for Lt LE due to knee pain so reduced to 2" with primary focus on eccentric control.   Good form with most all exercises with minimal cues needed for form.  Increased challenge of SLS on foam surface for bilateral LE and continued with vectors to improve LE stability.  Finished session with sit to stands with good form and bike for knee mobility (not included in billed charges).    Personal Factors and Comorbidities Age;Fitness;Comorbidity 1;Time since onset of injury/illness/exacerbation;Past/Current Experience;Behavior Pattern    Comorbidities hx cancer    Examination-Activity Limitations Transfers;Stand;Stairs;Squat;Locomotion Level    Examination-Participation Restrictions Occupation;Cleaning;Meal Prep;Yard Work;Volunteer;Shop;Interpersonal Relationship    Stability/Clinical Decision Making Stable/Uncomplicated    Rehab Potential Good    PT Frequency 2x / week    PT Duration 6 weeks    PT Treatment/Interventions ADLs/Self Care Home Management;Aquatic Therapy;Biofeedback;Canalith Repostioning;Electrical Stimulation;Cryotherapy;Iontophoresis  4mg /ml Dexamethasone;Moist Heat;DME Instruction;Gait training;Stair training;Functional mobility training;Therapeutic activities;Therapeutic exercise;Balance training;Neuromuscular re-education;Patient/family education;Manual techniques;Dry needling;Energy conservation    PT Next  Visit Plan Next session begin sidestep.  Continue LE strengthing and progress as able with emphasis on quads and hips, functional strentening.    PT Home Exercise Plan LAQ; 12/9: heel/toe raises, STS, SLS, vector stance           Patient will benefit from skilled therapeutic intervention in order to improve the following deficits and impairments:  Abnormal gait,Difficulty walking,Decreased range of motion,Decreased activity tolerance,Pain,Decreased balance,Improper body mechanics,Impaired flexibility,Decreased mobility,Decreased strength  Visit Diagnosis: Right knee pain, unspecified chronicity  Left knee pain, unspecified chronicity  Other abnormalities of gait and mobility  Muscle weakness (generalized)     Problem List Patient Active Problem List   Diagnosis Date Noted  . Genetic testing 11/07/2019  . Family history of breast cancer   . Family history of colon cancer   . Family history of nonmelanoma skin cancer   . Chemotherapy induced neutropenia ([place]) 08/15/2019  . Malignant neoplasm of upper-inner quadrant of right breast in female, estrogen receptor positive ([place]) 07/23/2019   Teena Irani, PTA/CLT [phone]  Teena Irani 04/21/2020, 5:06 PM  [facility] [address] [place], [place], [address] Phone: [phone]   Fax:  [phone]  Name: Debra [name] MRN: [id number] Date of Birth: 01-Oct-1960

## 2020-04-22 ENCOUNTER — Encounter (HOSPITAL_COMMUNITY): Payer: Self-pay | Admitting: Physical Therapy

## 2020-04-22 ENCOUNTER — Ambulatory Visit (HOSPITAL_COMMUNITY): Payer: BC Managed Care – PPO | Admitting: Physical Therapy

## 2020-04-22 DIAGNOSIS — R2689 Other abnormalities of gait and mobility: Secondary | ICD-10-CM

## 2020-04-22 DIAGNOSIS — M25562 Pain in left knee: Secondary | ICD-10-CM

## 2020-04-22 DIAGNOSIS — M6281 Muscle weakness (generalized): Secondary | ICD-10-CM

## 2020-04-22 DIAGNOSIS — R29898 Other symptoms and signs involving the musculoskeletal system: Secondary | ICD-10-CM

## 2020-04-22 DIAGNOSIS — M25561 Pain in right knee: Secondary | ICD-10-CM

## 2020-04-22 NOTE — Therapy (Signed)
[place], [place], [address] Phone: [phone]   Fax:  [phone]  Physical Therapy Treatment  Patient Details  Name: Debra [name] MRN: [id number] Date of Birth: Mar 11, 1961 Referring Provider (PT): Yaakov Guthrie MD   Encounter Date: 04/22/2020   PT End of Session - 04/22/20 1539    Visit Number 4    Number of Visits 12    Date for PT Re-Evaluation 05/18/20    Authorization Type BCBS  (no auth, no vl)    PT Start Time 1539   arrives late   PT Stop Time 1615    PT Time Calculation (min) 36 min    Activity Tolerance Patient tolerated treatment well    Behavior During Therapy [facility] for tasks assessed/performed           Past Medical History:  Diagnosis Date   Cancer ([place])    right breast   Family history of adverse reaction to anesthesia    Post-op N/V   Family history of breast cancer    Family history of colon cancer    Family history of nonmelanoma skin cancer    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[place])     Past Surgical History:  Procedure Laterality Date   ABDOMINAL HYSTERECTOMY     BREAST LUMPECTOMY WITH RADIOACTIVE SEED AND SENTINEL LYMPH NODE BIOPSY Right 01/08/2020   Procedure: RIGHT BREAST LUMPECTOMY WITH RADIOACTIVE SEED AND SENTINEL LYMPH NODE BIOPSY;  Surgeon: [name], MD;  Location: [place];  Service: General;  Laterality: Right;  LMA, PECTORAL BLOCK   COLONOSCOPY     LUNG BIOPSY     Per Care Everywhere: RUL lung biopsy 05/02/17 showed necrotizing granulomas   PORT-A-CATH REMOVAL Left 04/05/2020   Procedure: REMOVAL PORT-A-CATH;  Surgeon: [name], MD;  Location: [place];  Service: General;  Laterality: Left;   PORTACATH PLACEMENT Left 08/06/2019   Procedure: INSERTION PORT-A-CATH WITH ULTRASOUND GUIDANCE;  Surgeon: [name], MD;  Location: [place];  Service: General;  Laterality: Left;   WISDOM TOOTH  EXTRACTION      There were no vitals filed for this visit.   Subjective Assessment - 04/22/20 1540    Subjective Patient states the normal pain with stairs still. No soreness.    Currently in Pain? No/denies                             [facility] Adult PT Treatment/Exercise - 04/22/20 0001      Knee/Hip Exercises: Standing   Heel Raises 20 reps    Heel Raises Limitations without UE use    Lateral Step Up Both;1 set;10 reps;Hand Hold: 2;Step Height: 4"    Forward Step Up Both;2 sets;15 reps;Hand Hold: 0;Step Height: 4"    Functional Squat 2 sets;10 reps    SLS with Vectors 5x 5" intermittent HHA bilaterally    Other Standing Knee Exercises lateral stepping - painful, discontinued    Other Standing Knee Exercises tandem on foam 2x 30 seconds bilateral      Knee/Hip Exercises: Prone   Hip Extension Both;2 sets;10 reps                    PT Short Term Goals - 04/06/20 1617      PT SHORT TERM GOAL #1   Title Patient will be independent with HEP in order to improve functional outcomes.    Time 3  Period Weeks    Status New    Target Date 04/27/20      PT SHORT TERM GOAL #2   Title Patient will report at least 25% improvement in symptoms for improved quality of life.    Time 3    Period Weeks    Status New    Target Date 04/27/20             PT Long Term Goals - 04/06/20 1618      PT LONG TERM GOAL #1   Title Patient will report at least 75% improvement in symptoms for improved quality of life.    Time 4    Period Weeks    Status New    Target Date 05/04/20      PT LONG TERM GOAL #2   Title Patient will improve FOTO score by at least 5 points in order to indicate improved tolerance to activity.    Time 6    Period Weeks    Status New    Target Date 05/18/20      PT LONG TERM GOAL #3   Title Patient will be able to complete 5x STS in under 11.4 seconds in order to reduce the risk of falls.    Time 6    Period Weeks    Status New     Target Date 05/18/20      PT LONG TERM GOAL #4   Title Patient will be able to navigate stairs with reciprocal pattern without compensation or pain in order to demonstrate improved LE strength.    Time 6    Period Weeks    Status New    Target Date 05/18/20                 Plan - 04/22/20 1540    Clinical Impression Statement Patient completes step up intermittent UE support for balance and demonstrates impaired motor control with trembling during concentric and eccentric phase. Patient given cueing for eccentric motor control. Patient with good squatting mechanics today without verbal cueing. She experiences increase in knee pain with lateral stepping so it was discontinued today. Patient with min/mod sway with balance exercises today requiring intermittent unilateral UE support. Patient given [employer] form. Patient will continue to benefit from skilled physical therapy in order to reduce impairment and improve function.    Personal Factors and Comorbidities Age;Fitness;Comorbidity 1;Time since onset of injury/illness/exacerbation;Past/Current Experience;Behavior Pattern    Comorbidities hx cancer    Examination-Activity Limitations Transfers;Stand;Stairs;Squat;Locomotion Level    Examination-Participation Restrictions Occupation;Cleaning;Meal Prep;Yard Work;Volunteer;Shop;Interpersonal Relationship    Stability/Clinical Decision Making Stable/Uncomplicated    Rehab Potential Good    PT Frequency 2x / week    PT Duration 6 weeks    PT Treatment/Interventions ADLs/Self Care Home Management;Aquatic Therapy;Biofeedback;Canalith Repostioning;Electrical Stimulation;Cryotherapy;Iontophoresis 4mg /ml Dexamethasone;Moist Heat;DME Instruction;Gait training;Stair training;Functional mobility training;Therapeutic activities;Therapeutic exercise;Balance training;Neuromuscular re-education;Patient/family education;Manual techniques;Dry needling;Energy conservation    PT Next Visit Plan  Continue LE strengthing and progress as able with emphasis on quads and hips, functional strentening.    PT Home Exercise Plan LAQ; 12/9: heel/toe raises, STS, SLS, vector stance 12/16 Prone hip ext           Patient will benefit from skilled therapeutic intervention in order to improve the following deficits and impairments:  Abnormal gait,Difficulty walking,Decreased range of motion,Decreased activity tolerance,Pain,Decreased balance,Improper body mechanics,Impaired flexibility,Decreased mobility,Decreased strength  Visit Diagnosis: Right knee pain, unspecified chronicity  Left knee pain, unspecified chronicity  Other abnormalities of gait and mobility  Muscle weakness (generalized)  Other symptoms and signs involving the musculoskeletal system     Problem List Patient Active Problem List   Diagnosis Date Noted   Genetic testing 11/07/2019   Family history of breast cancer    Family history of colon cancer    Family history of nonmelanoma skin cancer    Chemotherapy induced neutropenia ([place]) 08/15/2019   Malignant neoplasm of upper-inner quadrant of right breast in female, estrogen receptor positive ([place]) 07/23/2019    4:23 PM, 04/22/20 Mearl Latin PT, DPT Physical Therapist at [place], [place], [address] Phone: [phone]   Fax:  [phone]  Name: Debra [name] MRN: [id number] Date of Birth: 31-Jan-1961

## 2020-04-27 ENCOUNTER — Encounter (HOSPITAL_COMMUNITY): Payer: BC Managed Care – PPO | Admitting: Physical Therapy

## 2020-04-29 ENCOUNTER — Encounter (HOSPITAL_COMMUNITY): Payer: BC Managed Care – PPO

## 2020-05-04 ENCOUNTER — Ambulatory Visit (HOSPITAL_COMMUNITY): Payer: BC Managed Care – PPO | Admitting: Physical Therapy

## 2020-05-04 ENCOUNTER — Other Ambulatory Visit: Payer: Self-pay

## 2020-05-04 ENCOUNTER — Encounter (HOSPITAL_COMMUNITY): Payer: Self-pay | Admitting: Physical Therapy

## 2020-05-04 DIAGNOSIS — M25561 Pain in right knee: Secondary | ICD-10-CM | POA: Diagnosis not present

## 2020-05-04 DIAGNOSIS — R2689 Other abnormalities of gait and mobility: Secondary | ICD-10-CM

## 2020-05-04 DIAGNOSIS — M25562 Pain in left knee: Secondary | ICD-10-CM

## 2020-05-04 DIAGNOSIS — M6281 Muscle weakness (generalized): Secondary | ICD-10-CM

## 2020-05-04 NOTE — Therapy (Signed)
[facility] [address] [place], [place], [address] Phone: [phone]   Fax:  [phone]  Physical Therapy Treatment  Patient Details  Name: Debra [name] MRN: [id number] Date of Birth: 1960-09-08 Referring Provider (PT): Leodis Sias MD   Encounter Date: 05/04/2020   PT End of Session - 05/04/20 1617    Visit Number 5    Number of Visits 12    Date for PT Re-Evaluation 05/18/20    Authorization Type BCBS  (no auth, no vl)    PT Start Time 1616    PT Stop Time 1655    PT Time Calculation (min) 39 min    Activity Tolerance Patient tolerated treatment well    Behavior During Therapy [facility] for tasks assessed/performed           Past Medical History:  Diagnosis Date  . Cancer [facility])    right breast  . Family history of adverse reaction to anesthesia    Post-op N/V  . Family history of breast cancer   . Family history of colon cancer   . Family history of nonmelanoma skin cancer   . 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[facility])     Past Surgical History:  Procedure Laterality Date  . ABDOMINAL HYSTERECTOMY    . BREAST LUMPECTOMY WITH RADIOACTIVE SEED AND SENTINEL LYMPH NODE BIOPSY Right 01/08/2020   Procedure: RIGHT BREAST LUMPECTOMY WITH RADIOACTIVE SEED AND SENTINEL LYMPH NODE BIOPSY;  Surgeon: Abigail Miyamoto, MD;  [facility];  Service: General;  Laterality: Right;  LMA, PECTORAL BLOCK  . COLONOSCOPY    . 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 PORT-A-CATH REMOVAL Left 04/05/2020   Procedure: REMOVAL PORT-A-CATH;  Surgeon: Abigail Miyamoto, MD;  Location: [facility];  Service: General;  Laterality: Left;  . PORTACATH PLACEMENT Left 08/06/2019   Procedure: INSERTION PORT-A-CATH WITH ULTRASOUND GUIDANCE;  Surgeon: Abigail Miyamoto, MD;  Location: [facility];  Service: General;  Laterality: Left;  . WISDOM TOOTH EXTRACTION       There were no vitals filed for this visit.   Subjective Assessment - 05/04/20 1623    Subjective States that her knees are a little sore today reporting 2/10 with the right side worse then the other. States she hasn't done many of her exercises as she was out of town for the holidays.    Currently in Pain? Yes    Pain Score 2     Pain Location Knee    Pain Orientation Left;Right    Pain Descriptors / Indicators Aching;Sore    Pain Type Chronic pain              [facility] PT Assessment - 05/04/20 0001      Assessment   Medical Diagnosis bilateral Knee pain    Referring Provider (PT) Leodis Sias MD    Onset Date/Surgical Date 04/06/19                         [facility] Adult PT Treatment/Exercise - 05/04/20 0001      Knee/Hip Exercises: Stretches   Hip Flexor Stretch 3 reps;30 seconds;Both   with step (split squat position)     Knee/Hip Exercises: Standing   Other Standing Knee Exercises heel taps 4" step with UE assist - painful on right - stopped      Knee/Hip Exercises: Seated   Other Seated Knee/Hip Exercises self  massage with rolling stick - 3 minutes Bilateral on each    Other Seated Knee/Hip Exercises inferior patella mobilization seated self - x15 5" holds B    Sit to Sand 10 reps;without UE support   slow eccentric lower                 PT Education - 05/04/20 1633    Education Details on anatomy, on exercises and rationale for exercises.    Person(s) Educated Patient    Methods Explanation    Comprehension Verbalized understanding            PT Short Term Goals - 04/06/20 1617      PT SHORT TERM GOAL #1   Title Patient will be independent with HEP in order to improve functional outcomes.    Time 3    Period Weeks    Status New    Target Date 04/27/20      PT SHORT TERM GOAL #2   Title Patient will report at least 25% improvement in symptoms for improved quality of life.    Time 3    Period Weeks    Status New    Target Date  04/27/20             PT Long Term Goals - 04/06/20 1618      PT LONG TERM GOAL #1   Title Patient will report at least 75% improvement in symptoms for improved quality of life.    Time 4    Period Weeks    Status New    Target Date 05/04/20      PT LONG TERM GOAL #2   Title Patient will improve FOTO score by at least 5 points in order to indicate improved tolerance to activity.    Time 6    Period Weeks    Status New    Target Date 05/18/20      PT LONG TERM GOAL #3   Title Patient will be able to complete 5x STS in under 11.4 seconds in order to reduce the risk of falls.    Time 6    Period Weeks    Status New    Target Date 05/18/20      PT LONG TERM GOAL #4   Title Patient will be able to navigate stairs with reciprocal pattern without compensation or pain in order to demonstrate improved LE strength.    Time 6    Period Weeks    Status New    Target Date 05/18/20                 Plan - 05/04/20 1617    Clinical Impression Statement Pain noted with heel taps from 4 inch step. Transitioned to self-mobilization of patella, quad muscle and added quad/hip flexor stretch. Returned to step down and this was still painful on the left. Reduced height and exercise was no longer painful.  Will continue to work on eccentric strengthening as tolerated. Added squats on decline with cue of pushing floor away and this was tolerated very well. Will continue to work on patella mobility, quadriceps lengthening and eccentric quadriceps strength within painfree ROM.    Personal Factors and Comorbidities Age;Fitness;Comorbidity 1;Time since onset of injury/illness/exacerbation;Past/Current Experience;Behavior Pattern    Comorbidities hx cancer    Examination-Activity Limitations Transfers;Stand;Stairs;Squat;Locomotion Level    Examination-Participation Restrictions Occupation;Cleaning;Meal Prep;Yard Work;Volunteer;Shop;Interpersonal Relationship    Stability/Clinical Decision Making  Stable/Uncomplicated    Rehab Potential Good    PT Frequency 2x /  week    PT Duration 6 weeks    PT Treatment/Interventions ADLs/Self Care Home Management;Aquatic Therapy;Biofeedback;Canalith Repostioning;Electrical Stimulation;Cryotherapy;Iontophoresis 4mg /ml Dexamethasone;Moist Heat;DME Instruction;Gait training;Stair training;Functional mobility training;Therapeutic activities;Therapeutic exercise;Balance training;Neuromuscular re-education;Patient/family education;Manual techniques;Dry needling;Energy conservation    PT Next Visit Plan review inferior patella mobilizations, patella mobility, quadriceps lengthening and eccentric quadriceps strength within painfree ROM.    PT Home Exercise Plan LAQ; 12/9: heel/toe raises, STS, SLS, vector stance 12/16 Prone hip ext; 12/28 hip flexor stretch on step           Patient will benefit from skilled therapeutic intervention in order to improve the following deficits and impairments:  Abnormal gait,Difficulty walking,Decreased range of motion,Decreased activity tolerance,Pain,Decreased balance,Improper body mechanics,Impaired flexibility,Decreased mobility,Decreased strength  Visit Diagnosis: Right knee pain, unspecified chronicity  Left knee pain, unspecified chronicity  Other abnormalities of gait and mobility  Muscle weakness (generalized)     Problem List Patient Active Problem List   Diagnosis Date Noted  . Genetic testing 11/07/2019  . Family history of breast cancer   . Family history of colon cancer   . Family history of nonmelanoma skin cancer   . Chemotherapy induced neutropenia ([place]) 08/15/2019  . Malignant neoplasm of upper-inner quadrant of right breast in female, estrogen receptor positive ([place]) 07/23/2019   5:08 PM, 05/04/20 Jerene Pitch, DPT Physical Therapy with [facility]  [phone] office  [place] [address] [place], [place],  [address] Phone: [phone]   Fax:  [phone]  Name: Debra [name] MRN: [id number] Date of Birth: [birth year]

## 2020-05-06 ENCOUNTER — Other Ambulatory Visit: Payer: Self-pay

## 2020-05-06 ENCOUNTER — Ambulatory Visit (HOSPITAL_COMMUNITY): Payer: BC Managed Care – PPO | Admitting: Physical Therapy

## 2020-05-06 ENCOUNTER — Encounter (HOSPITAL_COMMUNITY): Payer: Self-pay | Admitting: Physical Therapy

## 2020-05-06 DIAGNOSIS — M25561 Pain in right knee: Secondary | ICD-10-CM | POA: Diagnosis not present

## 2020-05-06 DIAGNOSIS — M25562 Pain in left knee: Secondary | ICD-10-CM

## 2020-05-06 DIAGNOSIS — R29898 Other symptoms and signs involving the musculoskeletal system: Secondary | ICD-10-CM

## 2020-05-06 DIAGNOSIS — M6281 Muscle weakness (generalized): Secondary | ICD-10-CM

## 2020-05-06 DIAGNOSIS — R2689 Other abnormalities of gait and mobility: Secondary | ICD-10-CM

## 2020-05-06 NOTE — Therapy (Signed)
[facility] [address] [place], [place], [address] Phone: [phone]   Fax:  [phone]  Physical Therapy Treatment  Patient Details  Name: Debra [name] MRN: [id number] Date of Birth: [birth year] Referring Provider (PT): Leodis Sias MD   Encounter Date: 05/06/2020   PT End of Session - 05/06/20 1535    Visit Number 6    Number of Visits 12    Date for PT Re-Evaluation 05/18/20    Authorization Type BCBS  (no auth, no vl)    PT Start Time 1535    PT Stop Time 1615    PT Time Calculation (min) 40 min    Activity Tolerance Patient tolerated treatment well    Behavior During Therapy [facility] for tasks assessed/performed           Past Medical History:  Diagnosis Date   Cancer ([facility])    right breast   Family history of adverse reaction to anesthesia    Post-op N/V   Family history of breast cancer    Family history of colon cancer    Family history of nonmelanoma skin cancer    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[facility])     Past Surgical History:  Procedure Laterality Date   ABDOMINAL HYSTERECTOMY     BREAST LUMPECTOMY WITH RADIOACTIVE SEED AND SENTINEL LYMPH NODE BIOPSY Right 01/08/2020   Procedure: RIGHT BREAST LUMPECTOMY WITH RADIOACTIVE SEED AND SENTINEL LYMPH NODE BIOPSY;  Surgeon: Abigail Miyamoto, MD;  [facility];  Service: General;  Laterality: Right;  LMA, PECTORAL BLOCK   COLONOSCOPY     LUNG BIOPSY     Per Care Everywhere: RUL lung biopsy 05/02/17 showed necrotizing granulomas   PORT-A-CATH REMOVAL Left 04/05/2020   Procedure: REMOVAL PORT-A-CATH;  Surgeon: Abigail Miyamoto, MD;  Location: [facility];  Service: General;  Laterality: Left;   PORTACATH PLACEMENT Left 08/06/2019   Procedure: INSERTION PORT-A-CATH WITH ULTRASOUND GUIDANCE;  Surgeon: Abigail Miyamoto, MD;  Location: [facility];  Service: General;  Laterality: Left;   WISDOM TOOTH EXTRACTION       There were no vitals filed for this visit.   Subjective Assessment - 05/06/20 1536    Subjective Patient states that she is feeling good. still having pain with stairs. She hasnt noticed much difference with mobs with that.    Currently in Pain? Yes    Pain Location Knee    Pain Orientation Right;Left    Pain Descriptors / Indicators Aching    Pain Type Chronic pain                             [facility] Adult PT Treatment/Exercise - 05/06/20 0001      Knee/Hip Exercises: Stretches   Lobbyist Both;3 reps;30 seconds    Quad Stretch Limitations with foot on chair    Hip Flexor Stretch 3 reps;30 seconds;Both    Hip Flexor Stretch Limitations with step in split squat position      Knee/Hip Exercises: Machines for Strengthening   Total Gym Leg Press 5 plates 71I; 4 plates 2 up one down RLE 1x 12; RLE only 1x 12, LLE 3 plates 2 up 1 down discontinued due to pain, 2 plates RLE only 1x 12      Knee/Hip Exercises: Standing   Step Down Left;Step Height: 2";Right;Step Height: 4";Hand Hold: 1;10 reps;3 sets    Step Down Limitations heel taps  Knee/Hip Exercises: Seated   Sit to Sand without UE support;15 reps;2 sets   eccentric     Manual Therapy   Manual Therapy Joint mobilization    Manual therapy comments completed independently from all other aspects of treatment    Joint Mobilization patellar mobs instruction, performance, and self mobs                  PT Education - 05/06/20 1536    Education Details Patient educated on HEP, exercise mechanics    Person(s) Educated Patient    Methods Explanation;Demonstration    Comprehension Verbalized understanding;Returned demonstration            PT Short Term Goals - 04/06/20 1617      PT SHORT TERM GOAL #1   Title Patient will be independent with HEP in order to improve functional outcomes.    Time 3    Period Weeks    Status New    Target Date 04/27/20      PT SHORT TERM GOAL #2   Title  Patient will report at least 25% improvement in symptoms for improved quality of life.    Time 3    Period Weeks    Status New    Target Date 04/27/20             PT Long Term Goals - 04/06/20 1618      PT LONG TERM GOAL #1   Title Patient will report at least 75% improvement in symptoms for improved quality of life.    Time 4    Period Weeks    Status New    Target Date 05/04/20      PT LONG TERM GOAL #2   Title Patient will improve FOTO score by at least 5 points in order to indicate improved tolerance to activity.    Time 6    Period Weeks    Status New    Target Date 05/18/20      PT LONG TERM GOAL #3   Title Patient will be able to complete 5x STS in under 11.4 seconds in order to reduce the risk of falls.    Time 6    Period Weeks    Status New    Target Date 05/18/20      PT LONG TERM GOAL #4   Title Patient will be able to navigate stairs with reciprocal pattern without compensation or pain in order to demonstrate improved LE strength.    Time 6    Period Weeks    Status New    Target Date 05/18/20                 Plan - 05/06/20 1536    Clinical Impression Statement Began session with instruction of inferior patellar mobs as patient was having difficulty performing. Utilized demonstration and verbal, tactile, and manual cueing for relaxion and technique. Patient completes forward step down on RLE from 4 inch step without pain but has to complete on LLE with 2 inch step secondary to pain. Patient given cueing for quad power and glute activation with STS exercise with good carry over. Patient demonstrating good motor control on leg press with bilateral and unilateral exercises today. Patient will continue to benefit from skilled physical therapy in order to reduce impairment and improve function.    Personal Factors and Comorbidities Age;Fitness;Comorbidity 1;Time since onset of injury/illness/exacerbation;Past/Current Experience;Behavior Pattern     Comorbidities hx cancer    Examination-Activity Limitations Transfers;Stand;Stairs;Squat;Locomotion Level  Examination-Participation Restrictions Occupation;Cleaning;Meal Prep;Yard Work;Volunteer;Shop;Interpersonal Relationship    Stability/Clinical Decision Making Stable/Uncomplicated    Rehab Potential Good    PT Frequency 2x / week    PT Duration 6 weeks    PT Treatment/Interventions ADLs/Self Care Home Management;Aquatic Therapy;Biofeedback;Canalith Repostioning;Electrical Stimulation;Cryotherapy;Iontophoresis 4mg /ml Dexamethasone;Moist Heat;DME Instruction;Gait training;Stair training;Functional mobility training;Therapeutic activities;Therapeutic exercise;Balance training;Neuromuscular re-education;Patient/family education;Manual techniques;Dry needling;Energy conservation    PT Next Visit Plan review inferior patella mobilizations, patella mobility, quadriceps lengthening and eccentric quadriceps strength within painfree ROM.    PT Home Exercise Plan LAQ; 12/9: heel/toe raises, STS, SLS, vector stance 12/16 Prone hip ext; 12/28 hip flexor stretch on step 12/30 patellar mobs           Patient will benefit from skilled therapeutic intervention in order to improve the following deficits and impairments:  Abnormal gait,Difficulty walking,Decreased range of motion,Decreased activity tolerance,Pain,Decreased balance,Improper body mechanics,Impaired flexibility,Decreased mobility,Decreased strength  Visit Diagnosis: Right knee pain, unspecified chronicity  Left knee pain, unspecified chronicity  Other abnormalities of gait and mobility  Muscle weakness (generalized)  Other symptoms and signs involving the musculoskeletal system     Problem List Patient Active Problem List   Diagnosis Date Noted   Genetic testing 11/07/2019   Family history of breast cancer    Family history of colon cancer    Family history of nonmelanoma skin cancer    Chemotherapy induced neutropenia  ([place]) 08/15/2019   Malignant neoplasm of upper-inner quadrant of right breast in female, estrogen receptor positive ([place]) 07/23/2019    4:12 PM, 05/06/20 Mearl Latin PT, DPT Physical Therapist at [place] [address] [place], [place], [address] Phone: [phone]   Fax:  [phone]  Name: Debra [name] MRN: [id number] Date of Birth: [birth year]

## 2020-05-11 ENCOUNTER — Other Ambulatory Visit: Payer: Self-pay

## 2020-05-11 ENCOUNTER — Encounter (HOSPITAL_COMMUNITY): Payer: Self-pay

## 2020-05-11 ENCOUNTER — Ambulatory Visit (HOSPITAL_COMMUNITY): Payer: BC Managed Care – PPO | Attending: Family Medicine

## 2020-05-11 DIAGNOSIS — M25561 Pain in right knee: Secondary | ICD-10-CM

## 2020-05-11 DIAGNOSIS — M25562 Pain in left knee: Secondary | ICD-10-CM | POA: Insufficient documentation

## 2020-05-11 DIAGNOSIS — R29898 Other symptoms and signs involving the musculoskeletal system: Secondary | ICD-10-CM | POA: Diagnosis present

## 2020-05-11 DIAGNOSIS — R2689 Other abnormalities of gait and mobility: Secondary | ICD-10-CM | POA: Diagnosis present

## 2020-05-11 DIAGNOSIS — M6281 Muscle weakness (generalized): Secondary | ICD-10-CM | POA: Diagnosis present

## 2020-05-11 NOTE — Therapy (Signed)
[facility] [address] [place], [place], [address] Phone: [phone]   Fax:  [phone]  Physical Therapy Treatment  Patient Details  Name: Debra [name] MRN: [id number] Date of Birth: 1960-06-15 Referring Provider (PT): Yaakov Guthrie MD   Encounter Date: 05/11/2020   PT End of Session - 05/11/20 1639    Visit Number 7    Number of Visits 12    Date for PT Re-Evaluation 05/18/20    Authorization Type BCBS  (no auth, no vl)    PT Start Time 1633    PT Stop Time 1712    PT Time Calculation (min) 39 min    Activity Tolerance Patient tolerated treatment well    Behavior During Therapy [facility] for tasks assessed/performed           Past Medical History:  Diagnosis Date  . Cancer [facility])    right breast  . Family history of adverse reaction to anesthesia    Post-op N/V  . Family history of breast cancer   . Family history of colon cancer   . Family history of nonmelanoma skin cancer   . 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[place])     Past Surgical History:  Procedure Laterality Date  . ABDOMINAL HYSTERECTOMY    . BREAST LUMPECTOMY WITH RADIOACTIVE SEED AND SENTINEL LYMPH NODE BIOPSY Right 01/08/2020   Procedure: RIGHT BREAST LUMPECTOMY WITH RADIOACTIVE SEED AND SENTINEL LYMPH NODE BIOPSY;  Surgeon: [name], MD;  Location: [place];  Service: General;  Laterality: Right;  LMA, PECTORAL BLOCK  . COLONOSCOPY    . 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 PORT-A-CATH REMOVAL Left 04/05/2020   Procedure: REMOVAL PORT-A-CATH;  Surgeon: [name], MD;  Location: [place];  Service: General;  Laterality: Left;  . PORTACATH PLACEMENT Left 08/06/2019   Procedure: INSERTION PORT-A-CATH WITH ULTRASOUND GUIDANCE;  Surgeon: [name], MD;  Location: [place];  Service: General;  Laterality: Left;  . WISDOM TOOTH EXTRACTION       There were no vitals filed for this visit.   Subjective Assessment - 05/11/20 1635    Subjective Pt reports she is feeling good today, no reports of pain currenlty.    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 Patient underwent neoadjuvant chemotherapy from 08/07/2019-12/03/2019. She had a right lumpectomy and sentinel node biopsy (1 negative node) on 01/08/2020.    Currently in Pain? No/denies              [facility] PT Assessment - 05/11/20 0001      Assessment   Medical Diagnosis bilateral Knee pain    Referring Provider (PT) Yaakov Guthrie MD    Onset Date/Surgical Date 04/06/19    Next MD Visit none scheduled                         [facility] Adult PT Treatment/Exercise - 05/11/20 0001      Knee/Hip Exercises: Stretches   Quad Stretch Both;3 reps;30 seconds    Quad Stretch Limitations Thomas stretch while standing with foot on chair      Knee/Hip Exercises: Standing   Lateral Step Up Right;15 reps;Step Height: 4";Left;Step Height: 2"    Forward Step Up Both;15 reps;Hand Hold: 0;Step Height: 6"    Step Down Left;Step Height: 2";Right;Step Height: 4";10 reps  Step Down Limitations heel taps    Wall Squat 10 reps;5 seconds    SLS with Vectors 5x 5" intermittent HHA bilaterally    Other Standing Knee Exercises GTB around thigh sidestep 2RT      Knee/Hip Exercises: Seated   Other Seated Knee/Hip Exercises inferior patella mobilization seated self - x15 5" holds B    Sit to Sand 10 reps;without UE support                    PT Short Term Goals - 04/06/20 1617      PT SHORT TERM GOAL #1   Title Patient will be independent with HEP in order to improve functional outcomes.    Time 3    Period Weeks    Status New    Target Date 04/27/20      PT SHORT TERM GOAL #2   Title Patient will report at least 25% improvement in symptoms for improved quality of  life.    Time 3    Period Weeks    Status New    Target Date 04/27/20             PT Long Term Goals - 04/06/20 1618      PT LONG TERM GOAL #1   Title Patient will report at least 75% improvement in symptoms for improved quality of life.    Time 4    Period Weeks    Status New    Target Date 05/04/20      PT LONG TERM GOAL #2   Title Patient will improve FOTO score by at least 5 points in order to indicate improved tolerance to activity.    Time 6    Period Weeks    Status New    Target Date 05/18/20      PT LONG TERM GOAL #3   Title Patient will be able to complete 5x STS in under 11.4 seconds in order to reduce the risk of falls.    Time 6    Period Weeks    Status New    Target Date 05/18/20      PT LONG TERM GOAL #4   Title Patient will be able to navigate stairs with reciprocal pattern without compensation or pain in order to demonstrate improved LE strength.    Time 6    Period Weeks    Status New    Target Date 05/18/20                 Plan - 05/11/20 1715    Clinical Impression Statement Reviewed inferior patellar mobs with minimal cueing required.  Session focus with quad strengthening in pain free exercises.  Pt continues to exhibit decreased eccentric control with Lt knee step downs and pain higher that 2in step height.  Added wall squats for quad strenghtening with good mechanics and no reports of pain.  Continued quad lengthening with good follow through with 2nd set of step up training.  Added hip strenghtneing exercises to POC with no reports of pain, did require intermittent HHA to improve mechanics.    Personal Factors and Comorbidities Age;Fitness;Comorbidity 1;Time since onset of injury/illness/exacerbation;Past/Current Experience;Behavior Pattern    Comorbidities hx cancer    Examination-Activity Limitations Transfers;Stand;Stairs;Squat;Locomotion Level    Examination-Participation Restrictions Occupation;Cleaning;Meal Prep;Yard  Work;Volunteer;Shop;Interpersonal Relationship    Stability/Clinical Decision Making Stable/Uncomplicated    Clinical Decision Making Low    Rehab Potential Good    PT Frequency 2x / week  PT Duration 6 weeks    PT Treatment/Interventions ADLs/Self Care Home Management;Aquatic Therapy;Biofeedback;Canalith Repostioning;Electrical Stimulation;Cryotherapy;Iontophoresis 4mg /ml Dexamethasone;Moist Heat;DME Instruction;Gait training;Stair training;Functional mobility training;Therapeutic activities;Therapeutic exercise;Balance training;Neuromuscular re-education;Patient/family education;Manual techniques;Dry needling;Energy conservation    PT Next Visit Plan review inferior patella mobilizations, patella mobility, quadriceps lengthening and eccentric quadriceps strength within painfree ROM.    PT Home Exercise Plan LAQ; 12/9: heel/toe raises, STS, SLS, vector stance 12/16 Prone hip ext; 12/28 hip flexor stretch on step 12/30 patellar mobs           Patient will benefit from skilled therapeutic intervention in order to improve the following deficits and impairments:  Abnormal gait,Difficulty walking,Decreased range of motion,Decreased activity tolerance,Pain,Decreased balance,Improper body mechanics,Impaired flexibility,Decreased mobility,Decreased strength  Visit Diagnosis: Right knee pain, unspecified chronicity  Left knee pain, unspecified chronicity  Other abnormalities of gait and mobility  Muscle weakness (generalized)  Other symptoms and signs involving the musculoskeletal system     Problem List Patient Active Problem List   Diagnosis Date Noted  . Genetic testing 11/07/2019  . Family history of breast cancer   . Family history of colon cancer   . Family history of nonmelanoma skin cancer   . Chemotherapy induced neutropenia ([place]) 08/15/2019  . Malignant neoplasm of upper-inner quadrant of right breast in female, estrogen receptor positive ([place]) 07/23/2019  Ihor Austin,  LPTA/CLT; CBIS [phone]   Aldona Lento 05/11/2020, 6:51 PM  [facility] [place], [place], [address] Phone: [phone]   Fax:  [phone]  Name: Claud Sina MRN: [id number] Date of Birth: 1961-03-10

## 2020-05-13 ENCOUNTER — Ambulatory Visit (HOSPITAL_COMMUNITY): Payer: BC Managed Care – PPO

## 2020-05-13 ENCOUNTER — Other Ambulatory Visit: Payer: Self-pay

## 2020-05-13 ENCOUNTER — Encounter (HOSPITAL_COMMUNITY): Payer: Self-pay

## 2020-05-13 DIAGNOSIS — M6281 Muscle weakness (generalized): Secondary | ICD-10-CM

## 2020-05-13 DIAGNOSIS — M25561 Pain in right knee: Secondary | ICD-10-CM

## 2020-05-13 DIAGNOSIS — R2689 Other abnormalities of gait and mobility: Secondary | ICD-10-CM

## 2020-05-13 DIAGNOSIS — M25562 Pain in left knee: Secondary | ICD-10-CM

## 2020-05-13 DIAGNOSIS — R29898 Other symptoms and signs involving the musculoskeletal system: Secondary | ICD-10-CM

## 2020-05-13 NOTE — Therapy (Signed)
[facility] [address] [place], [place], [address] Phone: [phone]   Fax:  [phone]  Physical Therapy Treatment  Patient Details  Name: Debra [name] MRN: [id number] Date of Birth: [birth year] Referring Provider (PT): Leodis Sias MD   Encounter Date: 05/13/2020   PT End of Session - 05/13/20 1538    Visit Number 8    Number of Visits 12    Date for PT Re-Evaluation 05/18/20    Authorization Type BCBS  (no auth, no vl)    PT Start Time 1532    PT Stop Time 1613    PT Time Calculation (min) 41 min    Activity Tolerance Patient tolerated treatment well    Behavior During Therapy [facility] for tasks assessed/performed           Past Medical History:  Diagnosis Date  . Cancer [facility])    right breast  . Family history of adverse reaction to anesthesia    Post-op N/V  . Family history of breast cancer   . Family history of colon cancer   . Family history of nonmelanoma skin cancer   . 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[facility])     Past Surgical History:  Procedure Laterality Date  . ABDOMINAL HYSTERECTOMY    . BREAST LUMPECTOMY WITH RADIOACTIVE SEED AND SENTINEL LYMPH NODE BIOPSY Right 01/08/2020   Procedure: RIGHT BREAST LUMPECTOMY WITH RADIOACTIVE SEED AND SENTINEL LYMPH NODE BIOPSY;  Surgeon: Abigail Miyamoto, MD;  [facility];  Service: General;  Laterality: Right;  LMA, PECTORAL BLOCK  . COLONOSCOPY    . 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 PORT-A-CATH REMOVAL Left 04/05/2020   Procedure: REMOVAL PORT-A-CATH;  Surgeon: Abigail Miyamoto, MD;  Location: [facility];  Service: General;  Laterality: Left;  . PORTACATH PLACEMENT Left 08/06/2019   Procedure: INSERTION PORT-A-CATH WITH ULTRASOUND GUIDANCE;  Surgeon: Abigail Miyamoto, MD;  Location: [facility];  Service: General;  Laterality: Left;  . WISDOM TOOTH EXTRACTION       There were no vitals filed for this visit.   Subjective Assessment - 05/13/20 1537    Subjective Pt reports she is feeling good today, no reports of pain today.    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 Patient underwent neoadjuvant chemotherapy from 08/07/2019-12/03/2019. She had a right lumpectomy and sentinel node biopsy (1 negative node) on 01/08/2020.    Currently in Pain? No/denies                             [facility] Adult PT Treatment/Exercise - 05/13/20 0001      Knee/Hip Exercises: Machines for Strengthening   Cybex Knee Extension 1Pl 10x    Cybex Knee Flexion 5Pl 2x 10 slow controlled    Total Gym Leg Press 5plates 2x 10 slow and controlled      Knee/Hip Exercises: Standing   Heel Raises 20 reps    Heel Raises Limitations without UE use, incline slope    Lateral Step Up Right;15 reps;Step Height: 4";Left;Step Height: 2"    Forward Step Up Both;15 reps;Hand Hold: 0;Step Height: 6"    Step Down Left;Step Height: 2";Right;Step Height: 4";10 reps    Step Down Limitations heel taps    Wall Squat 10 reps;10 seconds    SLS Lt 54", Rt  57"    SLS with Vectors 5x 5" intermittent HHA bilaterally    Other Standing Knee Exercises GTB around thigh sidestep 2RT      Knee/Hip Exercises: Seated   Sit to Sand 10 reps;without UE support   eccentric control                   PT Short Term Goals - 04/06/20 1617      PT SHORT TERM GOAL #1   Title Patient will be independent with HEP in order to improve functional outcomes.    Time 3    Period Weeks    Status New    Target Date 04/27/20      PT SHORT TERM GOAL #2   Title Patient will report at least 25% improvement in symptoms for improved quality of life.    Time 3    Period Weeks    Status New    Target Date 04/27/20             PT Long Term Goals - 04/06/20 1618      PT LONG TERM GOAL #1    Title Patient will report at least 75% improvement in symptoms for improved quality of life.    Time 4    Period Weeks    Status New    Target Date 05/04/20      PT LONG TERM GOAL #2   Title Patient will improve FOTO score by at least 5 points in order to indicate improved tolerance to activity.    Time 6    Period Weeks    Status New    Target Date 05/18/20      PT LONG TERM GOAL #3   Title Patient will be able to complete 5x STS in under 11.4 seconds in order to reduce the risk of falls.    Time 6    Period Weeks    Status New    Target Date 05/18/20      PT LONG TERM GOAL #4   Title Patient will be able to navigate stairs with reciprocal pattern without compensation or pain in order to demonstrate improved LE strength.    Time 6    Period Weeks    Status New    Target Date 05/18/20                 Plan - 05/13/20 1545    Clinical Impression Statement Pt reports she joined [employer].  Reviewed appropriate machines, instructed to keep weight light to medium and controlled movements.  Continued session focus with functional strengthening.  Pt continues to have difficulty with eccentric control with step down with Lt knee.  No reports of pain through session, was limited by fatigue.    Personal Factors and Comorbidities Age;Fitness;Comorbidity 1;Time since onset of injury/illness/exacerbation;Past/Current Experience;Behavior Pattern    Comorbidities hx cancer    Examination-Activity Limitations Transfers;Stand;Stairs;Squat;Locomotion Level    Examination-Participation Restrictions Occupation;Cleaning;Meal Prep;Yard Work;Volunteer;Shop;Interpersonal Relationship    Stability/Clinical Decision Making Stable/Uncomplicated    Clinical Decision Making Low    Rehab Potential Good    PT Frequency 2x / week    PT Duration 6 weeks    PT Treatment/Interventions ADLs/Self Care Home Management;Aquatic Therapy;Biofeedback;Canalith Repostioning;Electrical  Stimulation;Cryotherapy;Iontophoresis 4mg /ml Dexamethasone;Moist Heat;DME Instruction;Gait training;Stair training;Functional mobility training;Therapeutic activities;Therapeutic exercise;Balance training;Neuromuscular re-education;Patient/family education;Manual techniques;Dry needling;Energy conservation    PT Next Visit Plan Review goals next session.  Give theraband and advance HEP.    PT Home Exercise Plan LAQ; 12/9: heel/toe  raises, STS, SLS, vector stance 12/16 Prone hip ext; 12/28 hip flexor stretch on step 12/30 patellar mobs           Patient will benefit from skilled therapeutic intervention in order to improve the following deficits and impairments:  Abnormal gait,Difficulty walking,Decreased range of motion,Decreased activity tolerance,Pain,Decreased balance,Improper body mechanics,Impaired flexibility,Decreased mobility,Decreased strength  Visit Diagnosis: Right knee pain, unspecified chronicity  Left knee pain, unspecified chronicity  Other abnormalities of gait and mobility  Muscle weakness (generalized)  Other symptoms and signs involving the musculoskeletal system     Problem List Patient Active Problem List   Diagnosis Date Noted  . Genetic testing 11/07/2019  . Family history of breast cancer   . Family history of colon cancer   . Family history of nonmelanoma skin cancer   . Chemotherapy induced neutropenia ([place]) 08/15/2019  . Malignant neoplasm of upper-inner quadrant of right breast in female, estrogen receptor positive ([place]) 07/23/2019   Ihor Austin, LPTA/CLT; CBIS [phone]  Aldona Lento 05/13/2020, 4:48 PM  [facility] [address] [place], [place], [address] Phone: [phone]   Fax:  [phone]  Name: Debra [name] MRN: [id number] Date of Birth: 14-Jan-1961

## 2020-05-18 ENCOUNTER — Encounter (HOSPITAL_COMMUNITY): Payer: Self-pay | Admitting: Physical Therapy

## 2020-05-18 ENCOUNTER — Other Ambulatory Visit: Payer: Self-pay

## 2020-05-18 ENCOUNTER — Ambulatory Visit (HOSPITAL_COMMUNITY): Payer: BC Managed Care – PPO | Admitting: Physical Therapy

## 2020-05-18 DIAGNOSIS — M25561 Pain in right knee: Secondary | ICD-10-CM | POA: Diagnosis not present

## 2020-05-18 DIAGNOSIS — R29898 Other symptoms and signs involving the musculoskeletal system: Secondary | ICD-10-CM

## 2020-05-18 DIAGNOSIS — R2689 Other abnormalities of gait and mobility: Secondary | ICD-10-CM

## 2020-05-18 DIAGNOSIS — M25562 Pain in left knee: Secondary | ICD-10-CM

## 2020-05-18 DIAGNOSIS — M6281 Muscle weakness (generalized): Secondary | ICD-10-CM

## 2020-05-18 NOTE — Patient Instructions (Signed)
Access Code: [email or web address] URL: [email or web address] Date: 05/18/2020 Prepared by: Mitzi Hansen Emersyn Wyss  Exercises Prone Quadriceps Stretch with Strap - 1 x daily - 7 x weekly - 3 reps - 20-30 second hold Standing Hip Flexor Stretch on Chair - 1 x daily - 7 x weekly - 3 reps - 20-30 second hold Side Stepping with Resistance at Thighs - 1 x daily - 7 x weekly - 10 reps

## 2020-05-18 NOTE — Therapy (Signed)
[facility] [address] [place], [place], [address] Phone: [phone]   Fax:  [phone]  Physical Therapy Treatment/Discharge Summary  Patient Details  Name: Debra [name] MRN: [id number] Date of Birth: 05/04/1961 Referring Provider (PT): Yaakov Guthrie MD   Encounter Date: 05/18/2020 PHYSICAL THERAPY DISCHARGE SUMMARY  Visits from Start of Care: 9  Current functional level related to goals / functional outcomes: See below   Remaining deficits: See below   Education / Equipment: See below  Plan: Patient agrees to discharge.  Patient goals were met. Patient is being discharged due to meeting the stated rehab goals.  ?????        PT End of Session - 05/18/20 1534    Visit Number 9    Number of Visits 12    Date for PT Re-Evaluation 05/18/20    Authorization Type [employer]  (no auth, no vl)    PT Start Time 1535    PT Stop Time 1600    PT Time Calculation (min) 25 min    Activity Tolerance Patient tolerated treatment well    Behavior During Therapy WFL for tasks assessed/performed           Past Medical History:  Diagnosis Date  . Cancer [facility])    right breast  . Family history of adverse reaction to anesthesia    Post-op N/V  . Family history of breast cancer   . Family history of colon cancer   . Family history of nonmelanoma skin cancer   . 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 ABDOMINAL HYSTERECTOMY    . BREAST LUMPECTOMY WITH RADIOACTIVE SEED AND SENTINEL LYMPH NODE BIOPSY Right 01/08/2020   Procedure: RIGHT BREAST LUMPECTOMY WITH RADIOACTIVE SEED AND SENTINEL LYMPH NODE BIOPSY;  Surgeon: [name], MD;  Location: [place];  Service: General;  Laterality: Right;  LMA, PECTORAL BLOCK  . COLONOSCOPY    . 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 PORT-A-CATH REMOVAL Left 04/05/2020    Procedure: REMOVAL PORT-A-CATH;  Surgeon: [name], MD;  Location: [place];  Service: General;  Laterality: Left;  . PORTACATH PLACEMENT Left 08/06/2019   Procedure: INSERTION PORT-A-CATH WITH ULTRASOUND GUIDANCE;  Surgeon: [name], MD;  Location: [place];  Service: General;  Laterality: Left;  . WISDOM TOOTH EXTRACTION      There were no vitals filed for this visit.   Subjective Assessment - 05/18/20 1534    Subjective Patient states she has been able to go to the [facility]. Home exercises are going well. Patient states 60-70% improvement with starting therapy. She continues to have pain with stairs. She feels ready for discharge.    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 Patient underwent neoadjuvant chemotherapy from 08/07/2019-12/03/2019. She had a right lumpectomy and sentinel node biopsy (1 negative node) on 01/08/2020.    Currently in Pain? No/denies              [facility] PT Assessment - 05/18/20 0001      Assessment   Medical Diagnosis bilateral Knee pain    Referring Provider (PT) Yaakov Guthrie MD    Onset Date/Surgical Date 04/06/19    Next MD Visit none scheduled      Precautions   Precautions None  Restrictions   Weight Bearing Restrictions No      Balance Screen   Has the patient fallen in the past 6 months No    Has the patient had a decrease in activity level because of a fear of falling?  No    Is the patient reluctant to leave their home because of a fear of falling?  No      Prior Function   Level of Independence Independent    Vocation Full time employment    Museum/gallery curator      Cognition   Overall Cognitive Status Within Functional Limits for tasks assessed      Observation/Other Assessments   Observations Ambulates without AD    Focus on Therapeutic Outcomes (FOTO)  18% limited, 82%  function      Transfers   Five time sit to stand comments  9.13 seconds      Ambulation/Gait   Gait Comments stairs without UE support, intermittent "twinge" not painful                                 PT Education - 05/18/20 1539    Education Details Patient educated on HEP, exercise mechanics, progress made, returning to physical therapy if needed    Person(s) Educated Patient    Methods Explanation;Demonstration;Handout    Comprehension Verbalized understanding;Returned demonstration            PT Short Term Goals - 05/18/20 1540      PT SHORT TERM GOAL #1   Title Patient will be independent with HEP in order to improve functional outcomes.    Time 3    Period Weeks    Status Achieved    Target Date 04/27/20      PT SHORT TERM GOAL #2   Title Patient will report at least 25% improvement in symptoms for improved quality of life.    Time 3    Period Weeks    Status Achieved    Target Date 04/27/20             PT Long Term Goals - 05/18/20 1540      PT LONG TERM GOAL #1   Title Patient will report at least 75% improvement in symptoms for improved quality of life.    Time 4    Period Weeks    Status Partially Met      PT LONG TERM GOAL #2   Title Patient will improve FOTO score by at least 5 points in order to indicate improved tolerance to activity.    Time 6    Period Weeks    Status Achieved      PT LONG TERM GOAL #3   Title Patient will be able to complete 5x STS in under 11.4 seconds in order to reduce the risk of falls.    Time 6    Period Weeks    Status Achieved      PT LONG TERM GOAL #4   Title Patient will be able to navigate stairs with reciprocal pattern without compensation or pain in order to demonstrate improved LE strength.    Time 6    Period Weeks    Status Achieved                 Plan - 05/18/20 1535    Clinical Impression Statement Patient has met all short and long term goals with one long term  goal being partially met with ability to complete HEP and improvement in symptoms, strength, transfers, and functional mobility. Patient continues to have intermittent pain with stairs and ambulating hills but feels confident in her ability to manage symptoms with HEP. Patient educated on progress made and returning to PT if needed. Reviewed HEP with patient and gave updated handouts. Patient discharged from physical therapy at this time.    Personal Factors and Comorbidities Age;Fitness;Comorbidity 1;Time since onset of injury/illness/exacerbation;Past/Current Experience;Behavior Pattern    Comorbidities hx cancer    Examination-Activity Limitations Transfers;Stand;Stairs;Squat;Locomotion Level    Examination-Participation Restrictions Occupation;Cleaning;Meal Prep;Yard Work;Volunteer;Shop;Interpersonal Relationship    Stability/Clinical Decision Making Stable/Uncomplicated    Rehab Potential Good    PT Frequency --    PT Duration --    PT Treatment/Interventions ADLs/Self Care Home Management;Aquatic Therapy;Biofeedback;Canalith Repostioning;Electrical Stimulation;Cryotherapy;Iontophoresis 25m/ml Dexamethasone;Moist Heat;DME Instruction;Gait training;Stair training;Functional mobility training;Therapeutic activities;Therapeutic exercise;Balance training;Neuromuscular re-education;Patient/family education;Manual techniques;Dry needling;Energy conservation    PT Next Visit Plan n/a    PT Home Exercise Plan LAQ; 12/9: heel/toe raises, STS, SLS, vector stance 12/16 Prone hip ext; 12/28 hip flexor stretch on step 12/30 patellar mobs 1/11 lateral stepping with band, prone quad stretch, standing hip flexior stretch with chair           Patient will benefit from skilled therapeutic intervention in order to improve the following deficits and impairments:  Abnormal gait,Difficulty walking,Decreased range of motion,Decreased activity tolerance,Pain,Decreased balance,Improper body mechanics,Impaired  flexibility,Decreased mobility,Decreased strength  Visit Diagnosis: Right knee pain, unspecified chronicity  Left knee pain, unspecified chronicity  Other abnormalities of gait and mobility  Muscle weakness (generalized)  Other symptoms and signs involving the musculoskeletal system     Problem List Patient Active Problem List   Diagnosis Date Noted  . Genetic testing 11/07/2019  . Family history of breast cancer   . Family history of colon cancer   . Family history of nonmelanoma skin cancer   . Chemotherapy induced neutropenia ([place] 08/15/2019  . Malignant neoplasm of upper-inner quadrant of right breast in female, estrogen receptor positive ([place] 07/23/2019    4:09 PM, 05/18/20 AMearl LatinPT, DPT Physical [place] [address]Phone: [phone]  Fax:  [phone] Name: JGarlene AppersonMRN: [id number] of Birth: [birth year]

## 2020-05-20 ENCOUNTER — Encounter (HOSPITAL_COMMUNITY): Payer: BC Managed Care – PPO

## 2020-07-05 ENCOUNTER — Ambulatory Visit: Payer: BC Managed Care – PPO | Attending: Oncology

## 2020-07-05 ENCOUNTER — Other Ambulatory Visit: Payer: Self-pay

## 2020-07-05 DIAGNOSIS — Z483 Aftercare following surgery for neoplasm: Secondary | ICD-10-CM | POA: Insufficient documentation

## 2020-07-05 NOTE — Therapy (Signed)
[place], [place], [address] Phone: [phone]   Fax:  [phone]  Physical Therapy Treatment  Patient Details  Name: Debra [name] MRN: [id number] Date of Birth: 13-Oct-1960 Referring Provider (PT): Yaakov Guthrie MD   Encounter Date: 07/05/2020   PT End of Session - 07/05/20 1636    Visit Number 9   # unchanged due to screen   Number of Visits 12    Date for PT Re-Evaluation 05/18/20    PT Start Time 1630    PT Stop Time 1638    PT Time Calculation (min) 8 min    Activity Tolerance Patient tolerated treatment well    Behavior During Therapy [facility] for tasks assessed/performed           Past Medical History:  Diagnosis Date  . Cancer [facility])    right breast  . Family history of adverse reaction to anesthesia    Post-op N/V  . Family history of breast cancer   . Family history of colon cancer   . Family history of nonmelanoma skin cancer   . 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 ABDOMINAL HYSTERECTOMY    . BREAST LUMPECTOMY WITH RADIOACTIVE SEED AND SENTINEL LYMPH NODE BIOPSY Right 01/08/2020   Procedure: RIGHT BREAST LUMPECTOMY WITH RADIOACTIVE SEED AND SENTINEL LYMPH NODE BIOPSY;  Surgeon: [name], MD;  Location: [place];  Service: General;  Laterality: Right;  LMA, PECTORAL BLOCK  . COLONOSCOPY    . 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 PORT-A-CATH REMOVAL Left 04/05/2020   Procedure: REMOVAL PORT-A-CATH;  Surgeon: [name], MD;  Location: [place];  Service: General;  Laterality: Left;  . PORTACATH PLACEMENT Left 08/06/2019   Procedure: INSERTION PORT-A-CATH WITH ULTRASOUND GUIDANCE;  Surgeon: [name], MD;  Location: [place];  Service: General;  Laterality: Left;  . WISDOM TOOTH EXTRACTION      There  were no vitals filed for this visit.   Subjective Assessment - 07/05/20 1633    Subjective Pt returns for her 3 month L-Dex screen.    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 Patient underwent neoadjuvant chemotherapy from 08/07/2019-12/03/2019. She had a right lumpectomy and sentinel node biopsy (1 negative node) on 01/08/2020.                  L-DEX FLOWSHEETS - 07/05/20 1600      L-DEX LYMPHEDEMA SCREENING   Measurement Type Unilateral    L-DEX MEASUREMENT EXTREMITY Upper Extremity    POSITION  Standing    DOMINANT SIDE Right    At Risk Side Right    BASELINE SCORE (UNILATERAL) 1.8    L-DEX SCORE (UNILATERAL) -0.5    VALUE CHANGE (UNILAT) -2.3                               PT Short Term Goals - 05/18/20 1540      PT SHORT TERM GOAL #1   Title Patient will be independent with HEP in order to improve functional outcomes.    Time 3    Period Weeks    Status Achieved    Target Date 04/27/20      PT SHORT TERM GOAL #2  Title Patient will report at least 25% improvement in symptoms for improved quality of life.    Time 3    Period Weeks    Status Achieved    Target Date 04/27/20             PT Long Term Goals - 05/18/20 1540      PT LONG TERM GOAL #1   Title Patient will report at least 75% improvement in symptoms for improved quality of life.    Time 4    Period Weeks    Status Partially Met      PT LONG TERM GOAL #2   Title Patient will improve FOTO score by at least 5 points in order to indicate improved tolerance to activity.    Time 6    Period Weeks    Status Achieved      PT LONG TERM GOAL #3   Title Patient will be able to complete 5x STS in under 11.4 seconds in order to reduce the risk of falls.    Time 6    Period Weeks    Status Achieved      PT LONG TERM GOAL #4   Title Patient will be able to navigate stairs with  reciprocal pattern without compensation or pain in order to demonstrate improved LE strength.    Time 6    Period Weeks    Status Achieved                 Plan - 07/05/20 1638    Clinical Impression Statement Pt returns for her 3 month L-dex screen. Her change from baseline of -2.3 is WNLs so no further treatment is required at this time except to cont every 3 month L-Dex screens which pt is agreeable to.    PT Next Visit Plan Cont every 3 month L-Dex screens for up to 2 years from [facility].    Consulted and Agree with Plan of Care Patient           Patient will benefit from skilled therapeutic intervention in order to improve the following deficits and impairments:     Visit Diagnosis: Aftercare following surgery for neoplasm     Problem List Patient Active Problem List   Diagnosis Date Noted  . Genetic testing 11/07/2019  . Family history of breast cancer   . Family history of colon cancer   . Family history of nonmelanoma skin cancer   . Chemotherapy induced neutropenia ([place]) 08/15/2019  . Malignant neoplasm of upper-inner quadrant of right breast in female, estrogen receptor positive ([place]) 07/23/2019    [name], PTA 07/05/2020, 4:40 PM  [place], [place], [address] Phone: [phone]   Fax:  [phone]  Name: Debra [name] MRN: [id number] Date of Birth: Jun 18, 1960

## 2020-07-06 ENCOUNTER — Other Ambulatory Visit: Payer: Self-pay

## 2020-07-06 ENCOUNTER — Telehealth: Payer: Self-pay

## 2020-07-06 MED ORDER — TAMOXIFEN CITRATE 20 MG PO TABS: 20.0000 mg | ORAL_TABLET | Freq: Every day | ORAL | 2 refills | Status: DC

## 2020-07-06 NOTE — Telephone Encounter (Signed)
Contacted pt to her her know an order for tamoxifen had been called in . Pt verbalized understanding.

## 2020-08-13 ENCOUNTER — Ambulatory Visit
Admission: RE | Admit: 2020-08-13 | Discharge: 2020-08-13 | Disposition: A | Discharge status: Home / Self Care | Payer: BC Managed Care – PPO | Source: Ambulatory Visit | Attending: Oncology | Admitting: Oncology

## 2020-08-13 ENCOUNTER — Other Ambulatory Visit: Payer: Self-pay

## 2020-08-13 DIAGNOSIS — C50211 Malignant neoplasm of upper-inner quadrant of right female breast: Secondary | ICD-10-CM

## 2020-08-13 DIAGNOSIS — Z170 Estrogen receptor positive status [ER+]: Secondary | ICD-10-CM

## 2020-08-13 HISTORY — DX: Personal history of antineoplastic chemotherapy: Z92.21

## 2020-08-13 HISTORY — DX: Personal history of irradiation: Z92.3

## 2020-09-08 NOTE — Progress Notes (Signed)
[facility]  [phone] [phone]     ID: Debra [name] DOB: 12/27/1960  MR#: [id number]  CSN#:[id number]  Patient Care Team: Pa, [facility] as PCP - General (Family Medicine) Stuart, Dawn C, RN as Oncology Nurse Navigator Martini, Keisha N, RN as Oncology Nurse Navigator Blackman, Douglas, MD as Consulting Physician (General Surgery) Magrinat, Gustav C, MD as Consulting Physician (Oncology) Moody, John, MD as Consulting Physician (Radiation Oncology) Gustav C Magrinat, MD OTHER MD:  CHIEF COMPLAINT: functionally triple negative breast cancer  CURRENT TREATMENT: Tamoxifen   INTERVAL HISTORY: Debra [name] returns today for follow up of her 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 She is under observation.  Since her last visit, she underwent bilateral diagnostic mammography with tomography at [facility] on 08/13/2020 showing: breast density category C; no evidence of malignancy in either breast.   She is tolerating tamoxifen remarkably well, with no significant hot flashes or vaginal wetness issues.   REVIEW OF SYSTEMS: Debra [name] is doing remarkably well.  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  She is looking forward to the summer when she can be more active particularly at golf, which she enjoys.  A detailed review of systems today was otherwise stable   COVID 19 VACCINATION STATUS: Status post Pfizer x2 most recently March 2021, with no booster as of December 2021   HISTORY OF CURRENT ILLNESS: From the original intake note:  Debra [name] herself palpated a mass in the upper-inner right breast in February 2021.  It appeared to increase in size over the next 3 weeks. Physical exam performed at [facility] 07/15/2019 confirmed a 6 mm firm, oval, palpable mass in the upper-inner right breast. She underwent bilateral diagnostic mammography with tomography and bilateral breast  ultrasonography on 07/15/2019 showing: breast density category C; 4.6 cm mass in the right breast at 1 o'clock; single abnormal-appearing lymph node; bilateral benign cysts.  Accordingly on 07/21/2019 she proceeded to biopsy of the right breast area in question. 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 Prognostic indicators significant for: estrogen receptor, 60% positive with weak staining intensity and progesterone receptor, 0% negative. Proliferation marker Ki67 at 90%. HER2 equivocal by immunohistochemistry (2+), but negative by fluorescent in situ hybridization with a signals ratio 1.24 and number per cell 2.55.  The questionable right axillary lymph node was biopsied as well and was benign (concordant).  The patient's subsequent history is as detailed below.   PAST MEDICAL HISTORY: Past Medical History:  Diagnosis Date  . Cancer (HCC)    right breast  . Family history of adverse reaction to anesthesia    Post-op N/V  . Family history of breast cancer   . Family history of colon cancer   . Family history of nonmelanoma skin cancer   . 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HCC)   . Personal history of chemotherapy   . Personal history of radiation therapy     PAST SURGICAL HISTORY: Past Surgical History:  Procedure Laterality Date  . ABDOMINAL HYSTERECTOMY    . BREAST LUMPECTOMY WITH RADIOACTIVE SEED AND SENTINEL LYMPH NODE BIOPSY Right 01/08/2020   Procedure: RIGHT BREAST LUMPECTOMY WITH RADIOACTIVE SEED AND SENTINEL LYMPH NODE BIOPSY;  Surgeon: Blackman, Douglas, MD;  Location: [facility];  Service: General;  Laterality: Right;  LMA, PECTORAL BLOCK  . COLONOSCOPY    . LUNG BIOPSY     Per Care   Everywhere: RUL lung biopsy 05/02/17 showed necrotizing granulomas  . PORT-A-CATH REMOVAL Left 04/05/2020   Procedure: REMOVAL PORT-A-CATH;  Surgeon: Blackman, Douglas, MD;  Location: [facility];  Service: General;  Laterality: Left;  . PORTACATH PLACEMENT Left 08/06/2019   Procedure: INSERTION PORT-A-CATH WITH ULTRASOUND GUIDANCE;  Surgeon: Blackman, Douglas, MD;  Location: [facility];  Service: General;  Laterality: Left;  . WISDOM TOOTH EXTRACTION    Status post bilateral salpingo-oophorectomy   FAMILY HISTORY: Family History  Problem Relation Age of Onset  . Skin cancer Sister        non-melanoma dx. in her 40s  . Other Sister        normal genetic testing for hereditary cancer risks  . Colon cancer Paternal Grandfather        dx. in his 60s  . Wilm's tumor Son        dx. 9 months  . Breast cancer Other        dx. in her 70s (PGM's sister)  . Wilm's tumor Nephew 2  . Breast cancer Other        dx. in her 50s (PGM's niece)   Her father is 84 years old as of 07/2019. Her mother was murdered at age 33 (domestic violence).  The patient has 3 sisters and no brothers. She reports a third cousin (grandmother's sister's daughter) with breast cancer in her 50's. She denies a family history of ovarian, prostate, or pancreatic cancer. She does report colon cancer in her paternal grandfather in his 60's, non-melanoma skin cancer in her sister in her 40's, and Wilms tumor in her son at 9 months.   GYNECOLOGIC HISTORY:  Patient's last menstrual period was 05/09/2011. Menarche: 13-14 years old Age at first live birth: 60 years old GX P 3 LMP 2013 Contraceptive never used HRT used for approximately 9 months  Hysterectomy? Yes, 2013 BSO? 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 Husband Thanh Hoang is an electrical engineer. She lives at home with husband Thanh. Daughter Sara Wagner, age 38, is a poet and teacher in [place], [place]. Son Eric Moore, age 37, is an artist specializing in portraits in [place], [place]. Son Robert Mian, age 27, is a physicist working in cancer research in [place], [place] (at the [employer]  [employer] [facility]). Debra [name] has three grandchildren. She is a non-denominational Christian.    ADVANCED DIRECTIVES: In the absence of any documentation to the contrary, the patient's spouse is their HCPOA.    HEALTH MAINTENANCE: Social History   Tobacco Use  . Smoking status: Former Smoker    Packs/day: 1.00    Years: 10.00    Pack years: 10.00    Types: Cigarettes    Quit date: 1992    Years since quitting: 30.3  . Smokeless tobacco: Never Used  Vaping Use  . Vaping Use: Never used  Substance Use Topics  . Alcohol use: Yes    Alcohol/week: 2.0 standard drinks    Types: 2 Glasses of wine per week    Comment: occassionally  . Drug use: Never     Colonoscopy: none on file  PAP: none on file (s/p hysterectomy)  Bone density: n/a (age)   Allergies  Allergen Reactions  . Other Rash    Walnuts give her a rash       Current Outpatient Medications  Medication Sig Dispense Refill  . b complex vitamins capsule Take 1 capsule by mouth daily.    .   Multiple Vitamin (MULTI-VITAMIN DAILY PO) Take 1 tablet by mouth daily.     . tamoxifen (NOLVADEX) 20 MG tablet Take 1 tablet (20 mg total) by mouth daily. 90 tablet 2   No current facility-administered medications for this visit.   Facility-Administered Medications Ordered in Other Visits  Medication Dose Route Frequency Provider Last Rate Last Admin  . heparin lock flush 100 unit/mL  500 Units Intracatheter Once PRN Mariellen Blaney, [name], MD      . sodium chloride flush (NS) 0.9 % injection 10 mL  10 mL Intracatheter PRN Hillard Goodwine, [name], MD        OBJECTIVE: White woman who appears younger than stated age  Vitals:   09/09/20 1541  BP: 129/62  Pulse: 80  Resp: 18  Temp: 97.8 F (36.6 C)  SpO2: 99%     Body mass index is 31.05 kg/m.   Wt Readings from Last 3 Encounters:  09/09/20 216 lb 6.4 oz (98.2 kg)  04/16/20 208 lb 1.6 oz (94.4 kg)  04/05/20 207 lb 7.3 oz (94.1 kg)     ECOG FS:1 - Symptomatic but  completely ambulatory  Hair has come in quite curly Sclerae unicteric, EOMs intact Wearing a mask No cervical or supraclavicular adenopathy Lungs no rales or rhonchi Heart regular rate and rhythm Abd soft, nontender, positive bowel sounds MSK no focal spinal tenderness, no upper extremity lymphedema Neuro: nonfocal, well oriented, appropriate affect Breasts: The right breast has undergone lumpectomy and radiation.  The cosmetic result is very good.  There is no evidence of local recurrence.  Left breast and both axillae are benign per   LAB RESULTS:  CMP     Component Value Date/Time   NA 143 04/16/2020 1131   K 4.2 04/16/2020 1131   CL 108 04/16/2020 1131   CO2 26 04/16/2020 1131   GLUCOSE 91 04/16/2020 1131   BUN 15 04/16/2020 1131   CREATININE 0.75 04/16/2020 1131   CREATININE 0.76 07/30/2019 0825   CALCIUM 9.7 04/16/2020 1131   PROT 6.9 04/16/2020 1131   ALBUMIN 3.8 04/16/2020 1131   AST 21 04/16/2020 1131   AST 14 (L) 07/30/2019 0825   ALT 21 04/16/2020 1131   ALT 11 07/30/2019 0825   ALKPHOS 107 04/16/2020 1131   BILITOT 0.3 04/16/2020 1131   BILITOT 0.4 07/30/2019 0825   GFRNONAA >60 04/16/2020 1131   GFRNONAA >60 07/30/2019 0825   GFRAA >60 01/07/2020 1055   GFRAA >60 07/30/2019 0825    No results found for: TOTALPROTELP, ALBUMINELP, A1GS, A2GS, BETS, BETA2SER, GAMS, MSPIKE, SPEI  Lab Results  Component Value Date   WBC 6.6 09/09/2020   NEUTROABS 4.4 09/09/2020   HGB 11.5 (L) 09/09/2020   HCT 34.9 (L) 09/09/2020   MCV 92.8 09/09/2020   PLT 203 09/09/2020    No results found for: LABCA2  No components found for: LFYBOF751  No results for input(s): INR in the last 168 hours.  No results found for: LABCA2  No results found for: WCH852  No results found for: DPO242  No results found for: PNT614  No results found for: CA2729  No components found for: HGQUANT  No results found for: CEA1 / No results found for: CEA1   No results found for:  AFPTUMOR  No results found for: CHROMOGRNA  No results found for: KPAFRELGTCHN, LAMBDASER, KAPLAMBRATIO (kappa/lambda light chains)  No results found for: HGBA, HGBA2QUANT, HGBFQUANT, HGBSQUAN (Hemoglobinopathy evaluation)   No results found for: LDH  No results found for:  IRON, TIBC, IRONPCTSAT (Iron and TIBC)  No results found for: FERRITIN  Urinalysis    Component Value Date/Time   COLORURINE YELLOW 11/26/2019 1319   APPEARANCEUR CLEAR 11/26/2019 1319   LABSPEC 1.030 11/26/2019 1319   PHURINE 5.0 11/26/2019 1319   GLUCOSEU NEGATIVE 11/26/2019 1319   HGBUR NEGATIVE 11/26/2019 1319   BILIRUBINUR NEGATIVE 11/26/2019 1319   KETONESUR NEGATIVE 11/26/2019 1319   PROTEINUR NEGATIVE 11/26/2019 1319   NITRITE NEGATIVE 11/26/2019 1319   LEUKOCYTESUR TRACE (A) 11/26/2019 1319    STUDIES: MM DIAG BREAST TOMO BILATERAL  Result Date: 08/13/2020 CLINICAL DATA:  59-year-old female status post malignant right lumpectomy in September 2021. EXAM: DIGITAL DIAGNOSTIC BILATERAL MAMMOGRAM WITH TOMOSYNTHESIS AND CAD TECHNIQUE: Bilateral digital diagnostic mammography and breast tomosynthesis was performed. The images were evaluated with computer-aided detection. COMPARISON:  Previous exam(s). ACR Breast Density Category c: The breast tissue is heterogeneously dense, which may obscure small masses. FINDINGS: Interval postsurgical changes noted in the central right breast. Diffuse right-sided skin and trabecular thickening is consistent with radiation related changes. Otherwise, no new or suspicious findings in either breast. IMPRESSION: 1. No mammographic evidence of malignancy in either breast. 2. Interval right breast posttreatment changes. RECOMMENDATION: Diagnostic mammogram is suggested in 1 year. (Code:[id number]-B-01Y) I have discussed the findings and recommendations with the patient. If applicable, a reminder letter will be sent to the patient regarding the next appointment. BI-RADS CATEGORY  2:  Benign. Electronically Signed   By: Serena  Chacko M.D.   On: 08/13/2020 16:17     ELIGIBLE FOR AVAILABLE RESEARCH PROTOCOL: AET  ASSESSMENT: 59 y.o. [place], [place] woman status post right breast upper inner quadrant biopsy 07/21/2019 for a clinical T2N0, stage IIb invasive ductal carcinoma, grade 3, functionally triple negative (metaplastic features), with an MIB-1 of 90%  (1) neoadjuvant chemotherapy consisting of cyclophosphamide and doxorubicin in dose dense fashion x4 started 08/07/2019, completed 09/18/2019, followed by paclitaxel and carboplatin weekly started 10/08/2019  (a) echo 08/05/2019 showed an ejection fraction in the 60-65% range  (b) paclitaxel/carboplatinum discontinued after 9 doses (12/03/2019) secondary to grade 1 peripheral neuropathy  (2) status post right lumpectomy and sentinel lymph node sampling 01/08/2020 showing a complete pathologic response (ypT0 ypN0)  (a) a single right axillary lymph node was removed  (3) adjuvant radiation 02/12/2020 through 03/09/2020  (4) genetics testing 11/06/2019 through the [facility] Multi-Cancer Panel + Wilms Tumor Panel found no deleterious mutations in AIP, ALK, APC, ATM, AXIN2,BAP1,  BARD1, BLM, BMPR1A, BRCA1, BRCA2, BRIP1, CASR, CDC73, CDH1, CDK4, CDKN1B, CDKN1C, CDKN2A (p14ARF), CDKN2A (p16INK4a), CEBPA, CHEK2, CTNNA1, DICER1, DIS3L2, EGFR (c.2369C>T, p.Thr790Met variant only), EPCAM (Deletion/duplication testing only), FH, FLCN, GATA2, GPC3, GREM1 (Promoter region deletion/duplication testing only), HOXB13 (c.251G>A, p.Gly84Glu), HRAS, KIT, MAX, MEN1, MET, MITF (c.952G>A, p.Glu318Lys variant only), MLH1, MSH2, MSH3, MSH6, MUTYH, NBN, NF1, NF2, NTHL1, PALB2, PDGFRA, PHOX2B, PMS2, POLD1, POLE, POT1, PRKAR1A, PTCH1, PTEN, RAD50, RAD51C, RAD51D, RB1, RECQL4, RET, RNF43, RUNX1, SDHAF2, SDHA (sequence changes only), SDHB, SDHC, SDHD, SMAD4, SMARCA4, SMARCB1, SMARCE1, STK11, SUFU, TERC, TERT, TMEM127, TP53, TSC1, TSC2, VHL, WRN and WT1. The Wilms  Tumor Panel offered by [facility] includes sequencing and deletion/duplication testing of the following 7 genes: CDC73, CDKN1C, CTR9, DIS3L2, GPC3, REST, and WT1.  (a) Three variants of uncertain significance were detected - one in the AXIN2 gene called c.13A>G, one in the RNF43 gene called c.1770G>C, and one in the SDHC gene called c.85C>G.   (5) on prophylactic tamoxifen started March 2022  (a) s/p hysterectomy  (b) s/p HRT w/o   complications   PLAN: Debra [name] is now a little over half a year out from definitive surgery for her breast cancer with no evidence of disease recurrence.  This is favorable.  She started tamoxifen in March 2022.  She is tolerating this well.  This is being taken for prophylaxis, and the expectation that it will cut the risk of future breast cancers in half.  It will also incidentally assist with bone density issues.  She is aware of the possible toxicities side effects and complications of this agent.  I encouraged her to develop an exercise program especially over the summer.  She understand her very curly hair is going to become less currently over the next year and eventually returned to baseline.  The good news of course is that it came in quite thick  Her mammogram shows breast density category C and she understands this makes mammography less sensitive  She will see me again in 6 months.  She knows to call for any other issue that may develop before then  Total encounter time 20 minutes.*   Gustav C. Magrinat, MD 09/09/20 3:49 PM Medical Oncology and Hematology [facility] [address] , [place] [address] Tel. [phone]    Fax. [phone]   I, Katie Daubenspeck, am acting as scribe for Dr. Gustav C. Magrinat.  I, Gustav Magrinat MD, have reviewed the above documentation for accuracy and completeness, and I agree with the above.   *Total Encounter Time as defined by the Centers for Medicare and Medicaid Services includes, in  addition to the face-to-face time of a patient visit (documented in the note above) non-face-to-face time: obtaining and reviewing outside history, ordering and reviewing medications, tests or procedures, care coordination (communications with other health care professionals or caregivers) and documentation in the medical record. 

## 2020-09-09 ENCOUNTER — Inpatient Hospital Stay: Payer: BC Managed Care – PPO

## 2020-09-09 ENCOUNTER — Other Ambulatory Visit: Payer: Self-pay

## 2020-09-09 ENCOUNTER — Inpatient Hospital Stay: Payer: BC Managed Care – PPO | Attending: Oncology | Admitting: Oncology

## 2020-09-09 VITALS — BP 129/62 | HR 80 | Temp 97.8°F | Resp 18 | Wt 216.4 lb

## 2020-09-09 DIAGNOSIS — Z171 Estrogen receptor negative status [ER-]: Secondary | ICD-10-CM | POA: Insufficient documentation

## 2020-09-09 DIAGNOSIS — Z800 Family history of malignant neoplasm of digestive organs: Secondary | ICD-10-CM | POA: Diagnosis not present

## 2020-09-09 DIAGNOSIS — Z79810 Long term (current) use of selective estrogen receptor modulators (SERMs): Secondary | ICD-10-CM | POA: Diagnosis not present

## 2020-09-09 DIAGNOSIS — Z808 Family history of malignant neoplasm of other organs or systems: Secondary | ICD-10-CM | POA: Diagnosis not present

## 2020-09-09 DIAGNOSIS — Z9221 Personal history of antineoplastic chemotherapy: Secondary | ICD-10-CM | POA: Insufficient documentation

## 2020-09-09 DIAGNOSIS — C50212 Malignant neoplasm of upper-inner quadrant of left female breast: Secondary | ICD-10-CM | POA: Insufficient documentation

## 2020-09-09 DIAGNOSIS — Z170 Estrogen receptor positive status [ER+]: Secondary | ICD-10-CM | POA: Diagnosis not present

## 2020-09-09 DIAGNOSIS — Z87891 Personal history of nicotine dependence: Secondary | ICD-10-CM | POA: Insufficient documentation

## 2020-09-09 DIAGNOSIS — Z923 Personal history of irradiation: Secondary | ICD-10-CM | POA: Diagnosis not present

## 2020-09-09 DIAGNOSIS — C50211 Malignant neoplasm of upper-inner quadrant of right female breast: Secondary | ICD-10-CM

## 2020-09-09 DIAGNOSIS — Z803 Family history of malignant neoplasm of breast: Secondary | ICD-10-CM | POA: Insufficient documentation

## 2020-09-09 LAB — CBC WITH DIFFERENTIAL/PLATELET
Abs Immature Granulocytes: 0.02 10*3/uL (ref 0.00–0.07)
Basophils Absolute: 0 10*3/uL (ref 0.0–0.1)
Basophils Relative: 1 %
Eosinophils Absolute: 0.2 10*3/uL (ref 0.0–0.5)
Eosinophils Relative: 3 %
HCT: 34.9 % — ABNORMAL LOW (ref 36.0–46.0)
Hemoglobin: 11.5 g/dL — ABNORMAL LOW (ref 12.0–15.0)
Immature Granulocytes: 0 %
Lymphocytes Relative: 22 %
Lymphs Abs: 1.5 10*3/uL (ref 0.7–4.0)
MCH: 30.6 pg (ref 26.0–34.0)
MCHC: 33 g/dL (ref 30.0–36.0)
MCV: 92.8 fL (ref 80.0–100.0)
Monocytes Absolute: 0.5 10*3/uL (ref 0.1–1.0)
Monocytes Relative: 7 %
Neutro Abs: 4.4 10*3/uL (ref 1.7–7.7)
Neutrophils Relative %: 67 %
Platelets: 203 10*3/uL (ref 150–400)
RBC: 3.76 MIL/uL — ABNORMAL LOW (ref 3.87–5.11)
RDW: 12.7 % (ref 11.5–15.5)
WBC: 6.6 10*3/uL (ref 4.0–10.5)
nRBC: 0 % (ref 0.0–0.2)

## 2020-09-09 LAB — COMPREHENSIVE METABOLIC PANEL
ALT: 10 U/L (ref 0–44)
AST: 16 U/L (ref 15–41)
Albumin: 3.8 g/dL (ref 3.5–5.0)
Alkaline Phosphatase: 76 U/L (ref 38–126)
Anion gap: 10 (ref 5–15)
BUN: 18 mg/dL (ref 6–20)
CO2: 24 mmol/L (ref 22–32)
Calcium: 8.9 mg/dL (ref 8.9–10.3)
Chloride: 110 mmol/L (ref 98–111)
Creatinine, Ser: 0.8 mg/dL (ref 0.44–1.00)
GFR, Estimated: >60 mL/min (ref >60)
Glucose, Bld: 96 mg/dL (ref 70–99)
Potassium: 3.4 mmol/L — ABNORMAL LOW (ref 3.5–5.1)
Sodium: 144 mmol/L (ref 135–145)
Total Bilirubin: <0.2 mg/dL — ABNORMAL LOW (ref 0.3–1.2)
Total Protein: 6.7 g/dL (ref 6.5–8.1)

## 2020-09-09 MED ORDER — TAMOXIFEN CITRATE 20 MG PO TABS: 20.0000 mg | ORAL_TABLET | Freq: Every day | ORAL | 4 refills | Status: DC

## 2020-09-14 ENCOUNTER — Telehealth: Payer: Self-pay | Admitting: Oncology

## 2020-09-14 NOTE — Telephone Encounter (Signed)
Sch per 5/5 los Left message

## 2020-09-27 ENCOUNTER — Ambulatory Visit: Payer: BC Managed Care – PPO | Attending: Oncology

## 2020-09-27 ENCOUNTER — Other Ambulatory Visit: Payer: Self-pay

## 2020-09-27 DIAGNOSIS — Z483 Aftercare following surgery for neoplasm: Secondary | ICD-10-CM | POA: Insufficient documentation

## 2020-09-27 NOTE — Therapy (Signed)
[place], [place], [address] Phone: [phone]   Fax:  [phone]  Physical Therapy Treatment  Patient Details  Name: Debra [name] MRN: [id number] Date of Birth: [birth year] Referring Provider (PT): Yaakov Guthrie MD   Encounter Date: 09/27/2020   PT End of Session - 09/27/20 1643    Visit Number 9   # unchanged due to screen only   Number of Visits 12    PT Start Time 2703    PT Stop Time 1643    PT Time Calculation (min) 6 min    Activity Tolerance Patient tolerated treatment well    Behavior During Therapy [facility] for tasks assessed/performed           Past Medical History:  Diagnosis Date  . Cancer [facility])    right breast  . Family history of adverse reaction to anesthesia    Post-op N/V  . Family history of breast cancer   . Family history of colon cancer   . Family history of nonmelanoma skin cancer   . 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 Personal history of chemotherapy   . Personal history of radiation therapy     Past Surgical History:  Procedure Laterality Date  . ABDOMINAL HYSTERECTOMY    . BREAST LUMPECTOMY WITH RADIOACTIVE SEED AND SENTINEL LYMPH NODE BIOPSY Right 01/08/2020   Procedure: RIGHT BREAST LUMPECTOMY WITH RADIOACTIVE SEED AND SENTINEL LYMPH NODE BIOPSY;  Surgeon: [name], MD;  Location: [place];  Service: General;  Laterality: Right;  LMA, PECTORAL BLOCK  . COLONOSCOPY    . 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 PORT-A-CATH REMOVAL Left 04/05/2020   Procedure: REMOVAL PORT-A-CATH;  Surgeon: [name], MD;  Location: [place];  Service: General;  Laterality: Left;  . PORTACATH PLACEMENT Left 08/06/2019   Procedure: INSERTION PORT-A-CATH WITH ULTRASOUND GUIDANCE;  Surgeon: [name], MD;  Location: [employer];  Service: General;  Laterality:  Left;  . WISDOM TOOTH EXTRACTION      There were no vitals filed for this visit.   Subjective Assessment - 09/27/20 1641    Subjective Pt returns for her 3 month L-Dex screen.    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 Patient underwent neoadjuvant chemotherapy from 08/07/2019-12/03/2019. She had a right lumpectomy and sentinel node biopsy (1 negative node) on 01/08/2020.                  L-DEX FLOWSHEETS - 09/27/20 1600      L-DEX LYMPHEDEMA SCREENING   Measurement Type Unilateral    L-DEX MEASUREMENT EXTREMITY Upper Extremity    POSITION  Standing    DOMINANT SIDE Right    At Risk Side Right    BASELINE SCORE (UNILATERAL) 1.8    L-DEX SCORE (UNILATERAL) 2.2    VALUE CHANGE (UNILAT) 0.4                                         Plan - 09/27/20 1646    Clinical Impression Statement Pt returns for her 3 month L-Dex screen. Her chnage from baseline of 0.4 is WNLs so no further treatment is required at this time except to cont every 3 month L-Dex screens which pt  is agreeable to.    PT Next Visit Plan Cont every 3 month L-Dex screens for up to 2 years from [facility].    Consulted and Agree with Plan of Care Patient           Patient will benefit from skilled therapeutic intervention in order to improve the following deficits and impairments:     Visit Diagnosis: Aftercare following surgery for neoplasm     Problem List Patient Active Problem List   Diagnosis Date Noted  . Genetic testing 11/07/2019  . Family history of breast cancer   . Family history of colon cancer   . Family history of nonmelanoma skin cancer   . Chemotherapy induced neutropenia ([place]) 08/15/2019  . Malignant neoplasm of upper-inner quadrant of right breast in female, estrogen receptor positive ([place]) 07/23/2019    [name], PTA 09/27/2020, 4:47  PM  [place], [place], [address] Phone: [phone]   Fax:  [phone]  Name: Debra [name] MRN: [id number] Date of Birth: 01-31-1961

## 2020-10-22 ENCOUNTER — Other Ambulatory Visit: Payer: Self-pay

## 2020-10-22 ENCOUNTER — Ambulatory Visit: Payer: BC Managed Care – PPO | Admitting: Podiatry

## 2020-10-22 DIAGNOSIS — Q666 Other congenital valgus deformities of feet: Secondary | ICD-10-CM

## 2020-10-26 ENCOUNTER — Encounter: Payer: Self-pay | Admitting: Podiatry

## 2020-10-26 NOTE — Progress Notes (Signed)
Subjective:  Patient ID: Debra [name], female    DOB: [birth year],  MRN: [id number]  Chief Complaint  Patient presents with   Numbness    PT stated that after her chemo treatment she started having numbness in her toes     60 y.o. female presents with the above complaint.  Patient presents with complaint of bilateral arch and heel pain with numbness noted in her toes.  Patient states after the chemotherapy she started getting numbness in her toes.  She is not diabetic.  She denies any other acute, she has not seen anyone else prior to seeing me.  She would like to know what her treatment options are especially with arch and heel pain.   Review of Systems: Negative except as noted in the HPI. Denies N/V/F/Ch.  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 , Disp: , Rfl:    tamoxifen (NOLVADEX) 20 MG tablet, Take 1 tablet (20 mg total) by mouth daily., Disp: 90 tablet, Rfl: 4 No current facility-administered medications for this visit.  Facility-Administered Medications Ordered in Other Visits:    heparin lock flush 100 unit/mL, 500 Units, Intracatheter, Once PRN, Magrinat, [name], MD   sodium chloride flush (NS) 0.9 % injection 10 mL, 10 mL, Intracatheter, PRN, Magrinat, [name], MD  Social History   Tobacco Use  Smoking Status Former   Packs/day: 1.00   Years: 10.00   Pack years: 10.00   Types: Cigarettes   Quit date: 51   Years since quitting: 30.4   Smokeless Tobacco Never    Allergies  Allergen Reactions   Other Rash    Walnuts give her a rash    Walnuts give her a rash Walnuts give her a rash  Walnuts give her a rash Walnuts give her a rash Walnuts give her a rash Walnuts give her a rash   Objective:  There were no vitals filed for this visit. There is no height or weight on file to calculate BMI. Constitutional Well developed. Well nourished.  Vascular Dorsalis pedis pulses palpable bilaterally. Posterior tibial pulses palpable bilaterally. Capillary refill normal to all digits.  No cyanosis or clubbing noted. Pedal hair growth normal.  Neurologic Normal speech. Oriented to person, place, and time. Epicritic sensation to light touch grossly present bilaterally.  Dermatologic Nails well groomed and normal in appearance. No open wounds. No skin lesions.  Orthopedic: Generalized pain noted to arch and heel.  Gait examination shows Pes planovalgus deformity with calcaneal eversion to many toe signs partially able to recruit the arch with dorsiflexion of the hallux.  Unable to perform single and double heel raises.   Radiographs: None Assessment:   1. Pes planovalgus    Plan:  Patient was evaluated and treated and all questions answered.  Pes planovalgus -I explained to the patient the etiology of pes planovalgus and various treatment options were extensively discussed.  Given the amount of activity that she does on her foot she will benefit from custom-made orthotics to help control the hindfoot motion support the arch of the foot and take the pressure away from the arches and heel.  Patient states understanding like to obtain orthotics -Casting for orthotics was obtained  No follow-ups on file.

## 2020-10-29 ENCOUNTER — Telehealth: Payer: Self-pay | Admitting: *Deleted

## 2020-10-29 NOTE — Telephone Encounter (Signed)
Pt called to this RN to state she has been in close contact with her father who has been diagnosed with Covid.  She is calling to see if she should start the antiviral therapy.   " I completed by chemo treatment 10 months ago."  Per discussion pt is asymptomatic and this am she tested herself per home test for Covid with negative results.  This RN discussed use of oral antivirals for Covid are only approved for people who have a positive test and are at a greater risk for developing serious symptoms.  Per discussion she understands to monitor for symptoms - if occur she should test. If she develops a positive test she can call - with goal to start the antivirals with in 5 days of symptoms if appropriate.  Debra [name] verbalized understanding.

## 2020-11-17 ENCOUNTER — Telehealth: Payer: Self-pay | Admitting: Podiatry

## 2020-11-17 NOTE — Telephone Encounter (Signed)
Orthotics in.. lvm for pt to call to schedule an appt to pick them up. °

## 2020-12-27 ENCOUNTER — Other Ambulatory Visit: Payer: Self-pay

## 2020-12-27 ENCOUNTER — Ambulatory Visit: Payer: BC Managed Care – PPO | Attending: Oncology

## 2020-12-27 VITALS — Wt 215.4 lb

## 2020-12-27 DIAGNOSIS — Z483 Aftercare following surgery for neoplasm: Secondary | ICD-10-CM | POA: Insufficient documentation

## 2020-12-27 NOTE — Therapy (Signed)
[place], [place], [address] Phone: [phone]   Fax:  [phone]  Physical Therapy Treatment  Patient Details  Name: Debra [name] MRN: [id number] Date of Birth: Jul 11, 1960 Referring Provider (PT): Yaakov Guthrie MD   Encounter Date: 12/27/2020   PT End of Session - 12/27/20 1651     Visit Number 9   # unchanged due to screen only   PT Start Time [id number]    PT Stop Time 1656    PT Time Calculation (min) 10 min    Activity Tolerance Patient tolerated treatment well    Behavior During Therapy [facility] for tasks assessed/performed             Past Medical History:  Diagnosis Date   Cancer ([place])    right breast   Family history of adverse reaction to anesthesia    Post-op N/V   Family history of breast cancer    Family history of colon cancer    Family history of nonmelanoma skin cancer    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[place])    Personal history of chemotherapy    Personal history of radiation therapy     Past Surgical History:  Procedure Laterality Date   ABDOMINAL HYSTERECTOMY     BREAST LUMPECTOMY WITH RADIOACTIVE SEED AND SENTINEL LYMPH NODE BIOPSY Right 01/08/2020   Procedure: RIGHT BREAST LUMPECTOMY WITH RADIOACTIVE SEED AND SENTINEL LYMPH NODE BIOPSY;  Surgeon: [name], MD;  Location: [place];  Service: General;  Laterality: Right;  LMA, PECTORAL BLOCK   COLONOSCOPY     LUNG BIOPSY     Per Care Everywhere: RUL lung biopsy 05/02/17 showed necrotizing granulomas   PORT-A-CATH REMOVAL Left 04/05/2020   Procedure: REMOVAL PORT-A-CATH;  Surgeon: [name], MD;  Location: [place];  Service: General;  Laterality: Left;   PORTACATH PLACEMENT Left 08/06/2019   Procedure: INSERTION PORT-A-CATH WITH ULTRASOUND GUIDANCE;  Surgeon: [name], MD;  Location: [place];  Service: General;  Laterality: Left;   WISDOM TOOTH  EXTRACTION      Vitals:   12/27/20 1649  Weight: 215 lb 6 oz (97.7 kg)     Subjective Assessment - 12/27/20 1649     Subjective Pt returns for her 3 month L-Dex screen.    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 Patient underwent neoadjuvant chemotherapy from 08/07/2019-12/03/2019. She had a right lumpectomy and sentinel node biopsy (1 negative node) on 01/08/2020.                    L-DEX FLOWSHEETS - 12/27/20 1600       L-DEX LYMPHEDEMA SCREENING   Measurement Type Unilateral    L-DEX MEASUREMENT EXTREMITY Upper Extremity    POSITION  Standing    DOMINANT SIDE Right    At Risk Side Right    BASELINE SCORE (UNILATERAL) 1.8                                           Plan - 12/27/20 1656     Clinical Impression Statement Pt returns for her 3 month L-Dex screen. Her change from baseline of 2 is WNLs so no further treament is required at this time except to cont every 3 month L-Dex screens which pt is agreeable to.  PT Next Visit Plan Cont every 3 month L-Dex screens for up to 2 years from [facility].    Consulted and Agree with Plan of Care Patient             Patient will benefit from skilled therapeutic intervention in order to improve the following deficits and impairments:     Visit Diagnosis: Aftercare following surgery for neoplasm     Problem List Patient Active Problem List   Diagnosis Date Noted   Genetic testing 11/07/2019   Family history of breast cancer    Family history of colon cancer    Family history of nonmelanoma skin cancer    Chemotherapy induced neutropenia ([place]) 08/15/2019   Malignant neoplasm of upper-inner quadrant of right breast in female, estrogen receptor positive ([place]) 07/23/2019    Debra [name], PTA 12/27/2020, 4:57 PM  [place], [place], [address] Phone: [phone]   Fax:  [phone]  Name: Debra [name] MRN: [id number] Date of Birth: [birth year]

## 2021-02-23 IMAGING — MG MM PLC BREAST LOC DEV 1ST LESION INC*R*
8 series · 8 of 12 positions shown · non-contrast
Comparison: Previous exam(s).

CLINICAL DATA: 59-year-old female presenting for radioactive seed
localization of the right breast prior to lumpectomy.

EXAM:
MAMMOGRAPHIC GUIDED RADIOACTIVE SEED LOCALIZATION OF THE RIGHT
BREAST

[R ML (1 of 4)]
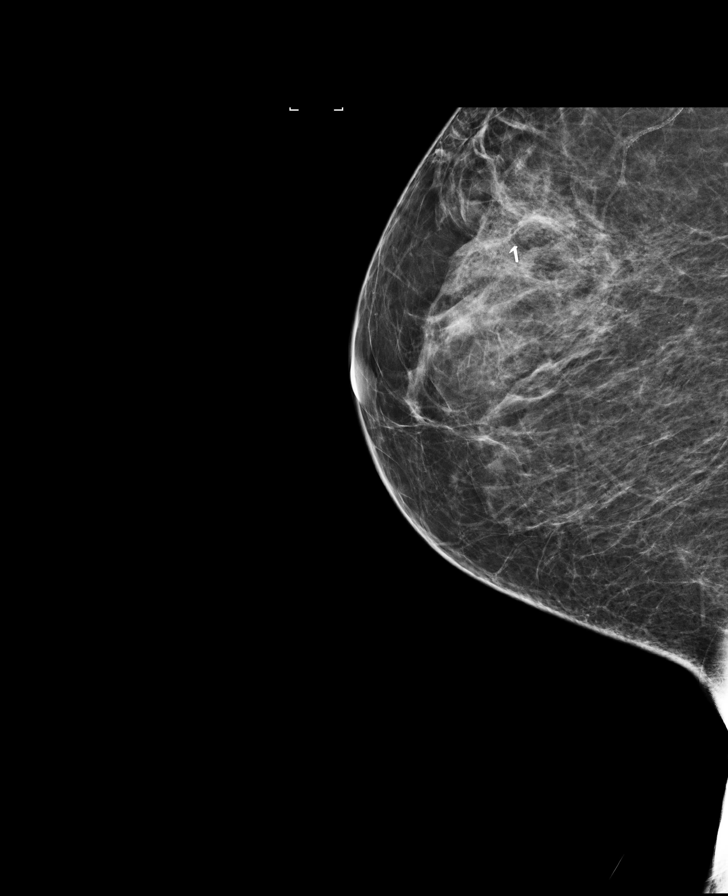

[R CC (1 of 2)]
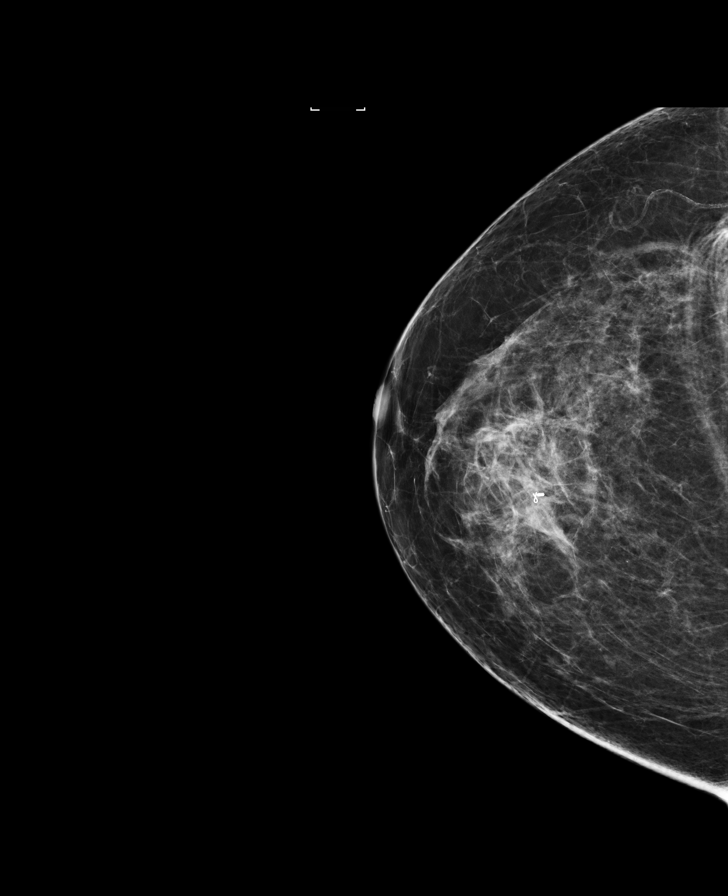

[R ML (2 of 4)]
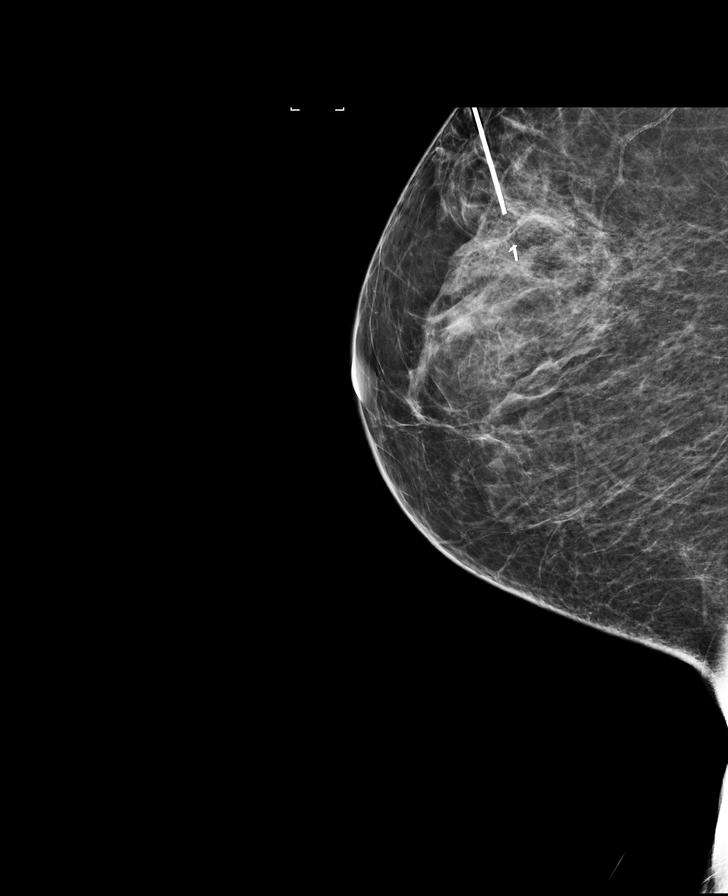

[R ML (3 of 4)]
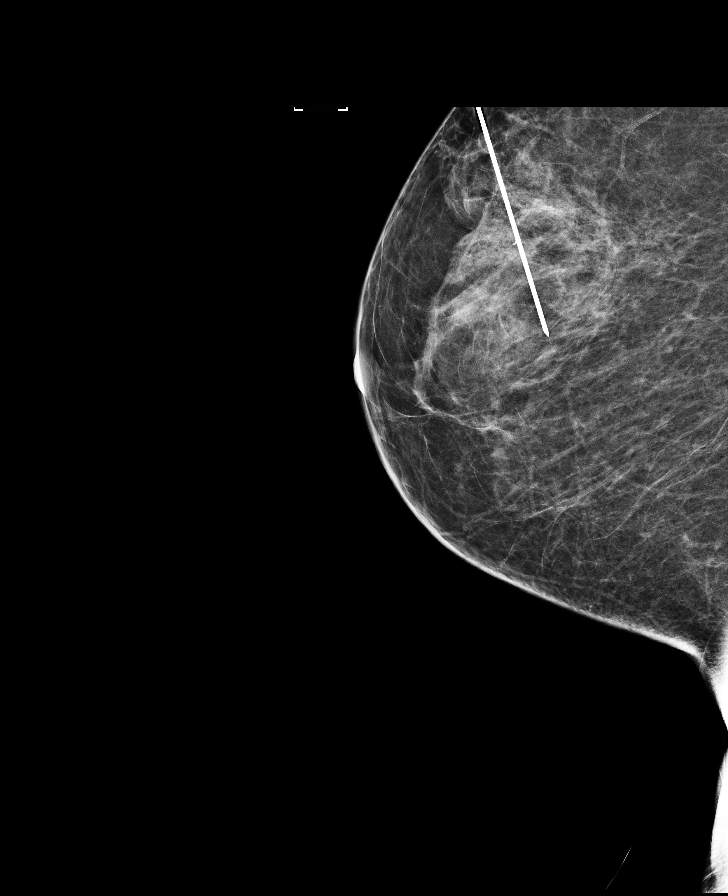

[R ML (4 of 4)]
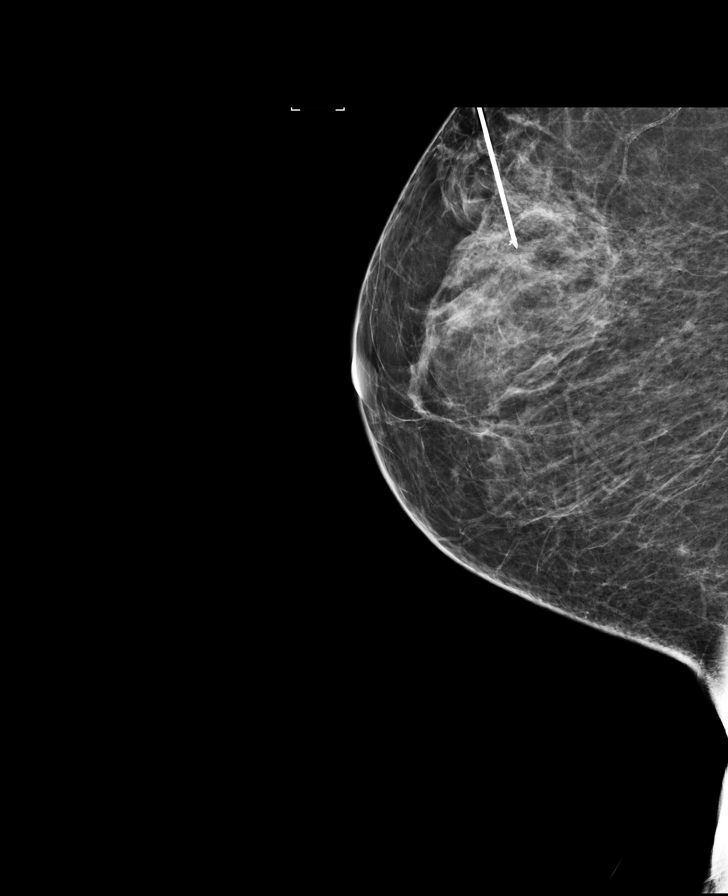

[R CC (2 of 2)]
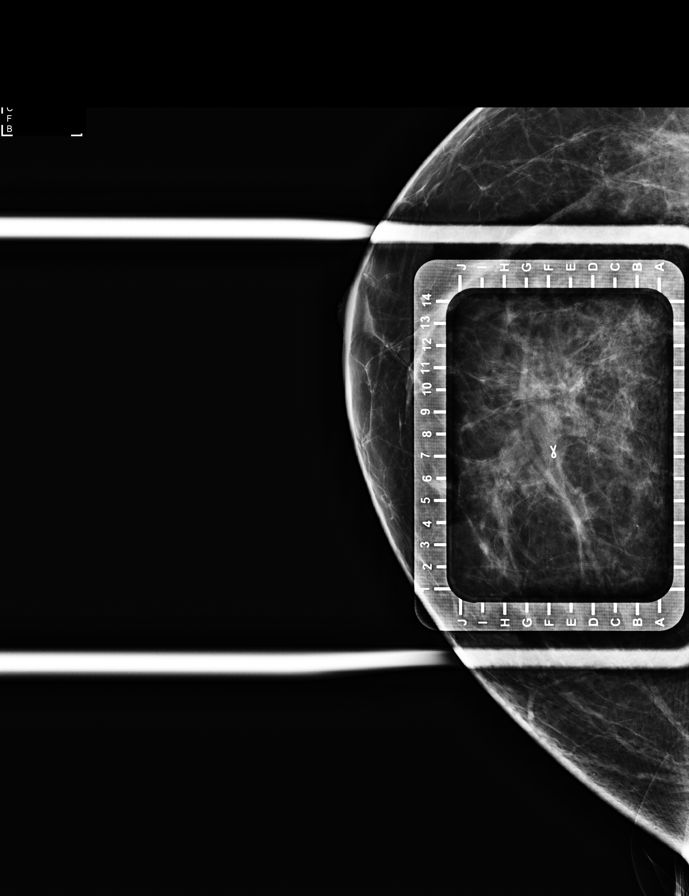

[R CC synth-2D]
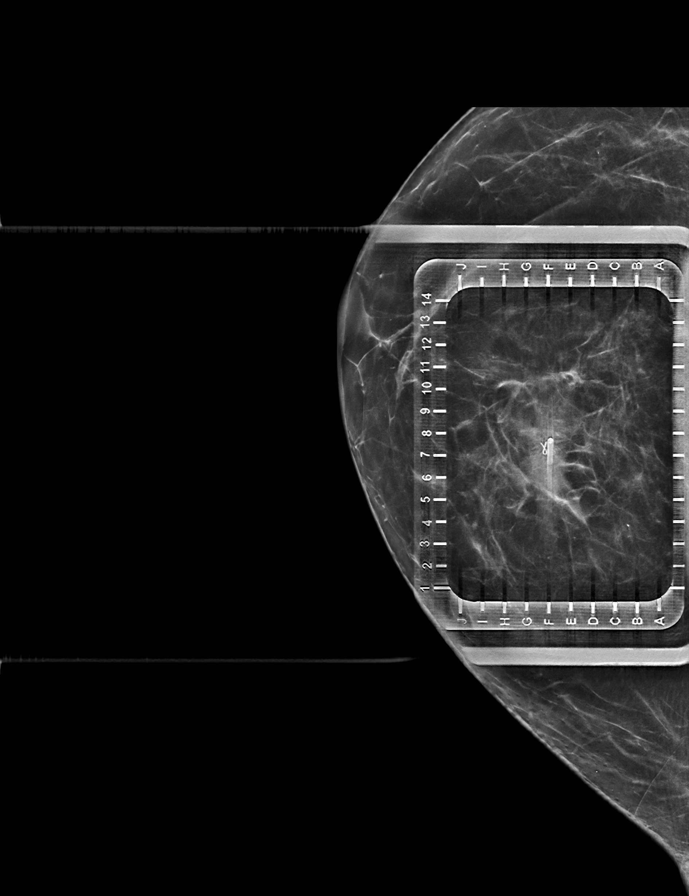

[R CC tomo · tomo slice 34/67.0]
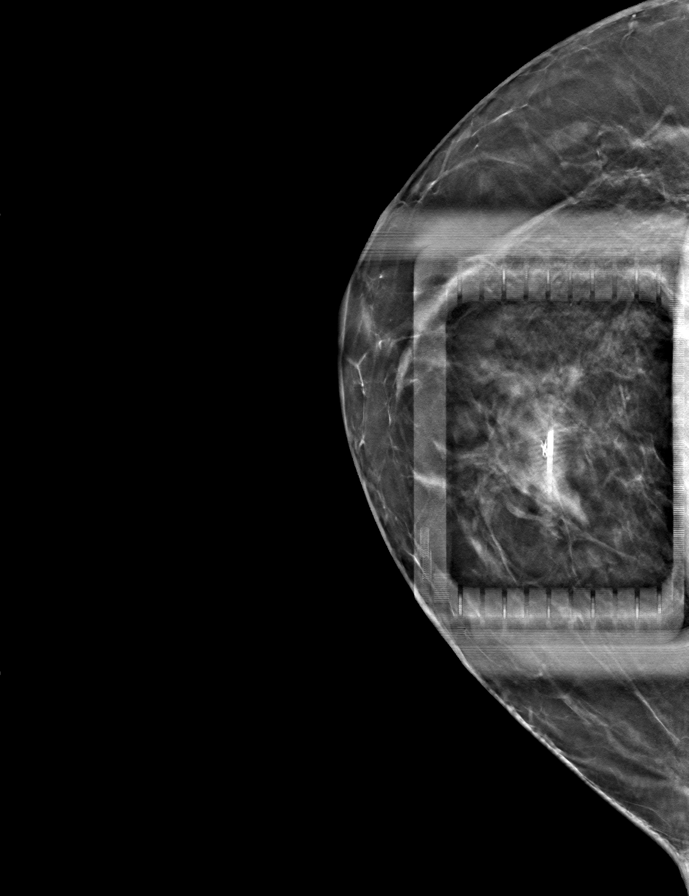

[8 of 12 positions shown; findings below may reference images not displayed]

FINDINGS: Patient presents for radioactive seed localization prior to right
breast lumpectomy. I met with the patient and we discussed the
procedure of seed localization including benefits and alternatives.
We discussed the high likelihood of a successful procedure. We
discussed the risks of the procedure including infection, bleeding,
tissue injury and further surgery. We discussed the low dose of
radioactivity involved in the procedure. Informed, written consent
was given.

The usual time-out protocol was performed immediately prior to the
procedure.

Using mammographic guidance, sterile technique, 1% lidocaine and an
[id number] radioactive seed, the ribbon shaped biopsy marking clip in the
upper inner right breast was localized using a superior approach.
The follow-up mammogram images confirm the seed in the expected
location and were marked for Dr. Solanki.

Follow-up survey of the patient confirms presence of the radioactive
seed.

Order number of J-[id number] seed:  [id number].

Total activity:  0.254 millicuries reference Date: 12/08/2019

The patient tolerated the procedure well and was released from the
[REDACTED]. She was given instructions regarding seed removal.
IMPRESSION: Radioactive seed localization right breast. No apparent
complications.

## 2021-02-24 IMAGING — MG MM BREAST SURGICAL SPECIMEN
1 series · 1 of 1 positions shown · non-contrast
Comparison: Previous exam(s).

CLINICAL DATA: Status post excision of the right breast following
radioactive seed localization of a lesion.

EXAM:
SPECIMEN RADIOGRAPH OF THE RIGHT BREAST

[R]
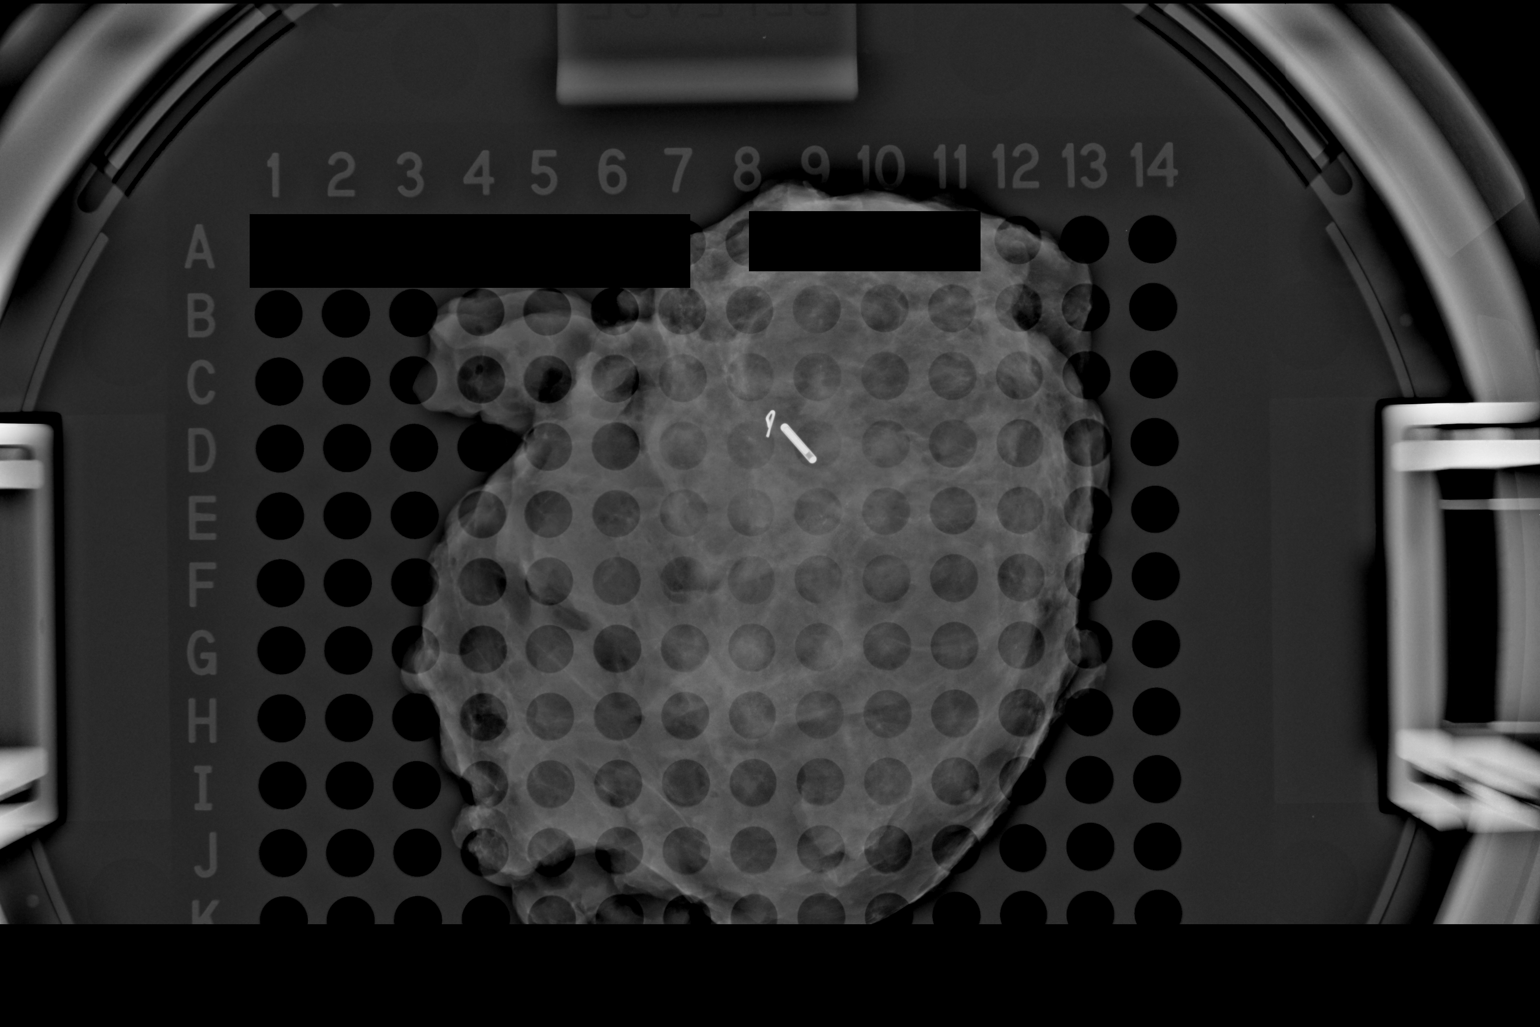

[1 of 1 positions shown; findings below may reference images not displayed]

FINDINGS: Status post excision of the right breast. The radioactive seed and
biopsy marker clip are present, completely intact, and were marked
for pathology.
IMPRESSION: Specimen radiograph of the right breast.

## 2021-03-15 ENCOUNTER — Inpatient Hospital Stay: Payer: BC Managed Care – PPO | Attending: Oncology

## 2021-03-15 ENCOUNTER — Other Ambulatory Visit: Payer: Self-pay

## 2021-03-15 DIAGNOSIS — Z8639 Personal history of other endocrine, nutritional and metabolic disease: Secondary | ICD-10-CM | POA: Diagnosis not present

## 2021-03-15 DIAGNOSIS — M25551 Pain in right hip: Secondary | ICD-10-CM | POA: Insufficient documentation

## 2021-03-15 DIAGNOSIS — Z9221 Personal history of antineoplastic chemotherapy: Secondary | ICD-10-CM | POA: Insufficient documentation

## 2021-03-15 DIAGNOSIS — Z79810 Long term (current) use of selective estrogen receptor modulators (SERMs): Secondary | ICD-10-CM | POA: Diagnosis not present

## 2021-03-15 DIAGNOSIS — Z90722 Acquired absence of ovaries, bilateral: Secondary | ICD-10-CM | POA: Diagnosis not present

## 2021-03-15 DIAGNOSIS — Z800 Family history of malignant neoplasm of digestive organs: Secondary | ICD-10-CM | POA: Diagnosis not present

## 2021-03-15 DIAGNOSIS — Z923 Personal history of irradiation: Secondary | ICD-10-CM | POA: Insufficient documentation

## 2021-03-15 DIAGNOSIS — C50211 Malignant neoplasm of upper-inner quadrant of right female breast: Secondary | ICD-10-CM | POA: Insufficient documentation

## 2021-03-15 DIAGNOSIS — Z808 Family history of malignant neoplasm of other organs or systems: Secondary | ICD-10-CM | POA: Insufficient documentation

## 2021-03-15 DIAGNOSIS — Z87891 Personal history of nicotine dependence: Secondary | ICD-10-CM | POA: Insufficient documentation

## 2021-03-15 DIAGNOSIS — G629 Polyneuropathy, unspecified: Secondary | ICD-10-CM | POA: Insufficient documentation

## 2021-03-15 DIAGNOSIS — Z803 Family history of malignant neoplasm of breast: Secondary | ICD-10-CM | POA: Diagnosis not present

## 2021-03-15 DIAGNOSIS — Z171 Estrogen receptor negative status [ER-]: Secondary | ICD-10-CM | POA: Insufficient documentation

## 2021-03-15 DIAGNOSIS — Z90710 Acquired absence of both cervix and uterus: Secondary | ICD-10-CM | POA: Diagnosis not present

## 2021-03-15 LAB — CBC WITH DIFFERENTIAL/PLATELET
Abs Immature Granulocytes: 0.01 10*3/uL (ref 0.00–0.07)
Basophils Absolute: 0 10*3/uL (ref 0.0–0.1)
Basophils Relative: 1 %
Eosinophils Absolute: 0.2 10*3/uL (ref 0.0–0.5)
Eosinophils Relative: 2 %
HCT: 37.8 % (ref 36.0–46.0)
Hemoglobin: 12.3 g/dL (ref 12.0–15.0)
Immature Granulocytes: 0 %
Lymphocytes Relative: 26 %
Lymphs Abs: 2 10*3/uL (ref 0.7–4.0)
MCH: 30.4 pg (ref 26.0–34.0)
MCHC: 32.5 g/dL (ref 30.0–36.0)
MCV: 93.6 fL (ref 80.0–100.0)
Monocytes Absolute: 0.6 10*3/uL (ref 0.1–1.0)
Monocytes Relative: 7 %
Neutro Abs: 4.8 10*3/uL (ref 1.7–7.7)
Neutrophils Relative %: 64 %
Platelets: 186 10*3/uL (ref 150–400)
RBC: 4.04 MIL/uL (ref 3.87–5.11)
RDW: 12.5 % (ref 11.5–15.5)
WBC: 7.6 10*3/uL (ref 4.0–10.5)
nRBC: 0 % (ref 0.0–0.2)

## 2021-03-15 LAB — COMPREHENSIVE METABOLIC PANEL
ALT: 10 U/L (ref 0–44)
AST: 17 U/L (ref 15–41)
Albumin: 3.8 g/dL (ref 3.5–5.0)
Alkaline Phosphatase: 62 U/L (ref 38–126)
Anion gap: 9 (ref 5–15)
BUN: 17 mg/dL (ref 8–23)
CO2: 25 mmol/L (ref 22–32)
Calcium: 9 mg/dL (ref 8.9–10.3)
Chloride: 107 mmol/L (ref 98–111)
Creatinine, Ser: 0.71 mg/dL (ref 0.44–1.00)
GFR, Estimated: >60 mL/min (ref >60)
Glucose, Bld: 100 mg/dL — ABNORMAL HIGH (ref 70–99)
Potassium: 3.9 mmol/L (ref 3.5–5.1)
Sodium: 141 mmol/L (ref 135–145)
Total Bilirubin: 0.3 mg/dL (ref 0.3–1.2)
Total Protein: 6.8 g/dL (ref 6.5–8.1)

## 2021-03-15 LAB — SEDIMENTATION RATE: Sed Rate: 27 mm/hr — ABNORMAL HIGH (ref 0–22)

## 2021-03-15 LAB — SAVE SMEAR(SSMR), FOR PROVIDER SLIDE REVIEW

## 2021-03-15 LAB — VITAMIN B12: Vitamin B-12: 328 pg/mL (ref 180–914)

## 2021-03-15 LAB — FOLATE: Folate: 29.3 ng/mL (ref >5.9)

## 2021-03-16 LAB — IRON AND TIBC
Iron: 56 ug/dL (ref 41–142)
Saturation Ratios: 19 % — ABNORMAL LOW (ref 21–57)
TIBC: 295 ug/dL (ref 236–444)
UIBC: 239 ug/dL (ref 120–384)

## 2021-03-16 LAB — ANTINUCLEAR ANTIBODIES, IFA: ANA Ab, IFA: NEGATIVE

## 2021-03-16 LAB — FERRITIN: Ferritin: 309 ng/mL — ABNORMAL HIGH (ref 11–307)

## 2021-03-16 NOTE — Progress Notes (Signed)
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   She is tolerating tamoxifen remarkably well, with no significant hot flashes or vaginal wetness issues.  We had noted what appeared to be a progressive anemia over the last year and went ahead and added an anemia work-up.  However by now the anemia appears to resolved with a hemoglobin of 12.3 and MCV 93.6.  White cells and platelets are also normal as is the folate, B12 and ferritin.  The GFR is also normal.  The iron saturation is slightly low and the ESR is minimally high indicating mild inflammation as the possible cause of the previously noted anemia.  Her most recent mammogram was 08/13/2020 at the breast center shows density category C.  REVIEW OF SYSTEMS: Debra [name] tells me her right breast is sore pretty much all the time.  This is not a burning feeling it is not on the skin but more an achiness in the breast itself.  There has been no discharge.  The armpit surprisingly is fine.  She also has pain in the right hip.  This came on a couple of months ago after playing some golf and it did get better but then it has not resolved and it is there pretty much whenever she weightbears.  When she is sitting or lying it does not hurt.  The left side and knees do not  bother her.  She continues to work full-time and enjoys the current class she has.  She tells me she gets at least 3000 steps at work almost all days and then she takes a walk in the evening.  A detailed review of systems was otherwise benign   COVID 19 VACCINATION STATUS: Status post Pfizer x2, status post infection June 2022  HISTORY OF CURRENT ILLNESS: From the original intake note:  [name] herself palpated a mass in the upper-inner right breast in February 2021.  It appeared to increase in size over the next 3 weeks. Physical exam performed at [facility] 07/15/2019 confirmed a 6 mm firm, oval, palpable mass in the upper-inner right breast. She underwent bilateral diagnostic mammography with tomography and bilateral breast ultrasonography on 07/15/2019 showing: breast density category C; 4.6 cm mass in the right breast at 1 o'clock; single abnormal-appearing lymph node; bilateral benign cysts.  Accordingly on 07/21/2019 she proceeded to biopsy of the right breast area in question. 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 Prognostic indicators significant for: estrogen receptor, 60% positive with weak staining intensity and progesterone receptor, 0% negative. Proliferation marker Ki67 at 90%. HER2 equivocal by immunohistochemistry (2+), but negative by fluorescent in situ hybridization with a signals ratio 1.24 and number per cell 2.55.  The questionable right axillary lymph node was biopsied as well and was benign (concordant).  The patient's subsequent history is as detailed below.   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[place])    Personal history of chemotherapy    Personal history of radiation therapy     PAST SURGICAL HISTORY: Past Surgical History:  Procedure Laterality Date   ABDOMINAL HYSTERECTOMY     BREAST LUMPECTOMY WITH RADIOACTIVE SEED AND SENTINEL LYMPH NODE BIOPSY Right 01/08/2020   Procedure: RIGHT BREAST LUMPECTOMY WITH RADIOACTIVE SEED AND SENTINEL LYMPH NODE BIOPSY;  Surgeon: [name], MD;  Location: [facility];  Service: General;  Laterality: Right;  LMA, PECTORAL BLOCK   COLONOSCOPY     LUNG BIOPSY     Per Care Everywhere: RUL lung biopsy 05/02/17 showed necrotizing granulomas   PORT-A-CATH REMOVAL Left 04/05/2020   Procedure: REMOVAL PORT-A-CATH;  Surgeon: [name], MD;  Location: [place];  Service: General;  Laterality: Left;   PORTACATH PLACEMENT Left 08/06/2019   Procedure: INSERTION PORT-A-CATH WITH ULTRASOUND GUIDANCE;  Surgeon: [name], MD;  Location: [facility];  Service: General;  Laterality: Left;   WISDOM TOOTH EXTRACTION    Status post bilateral salpingo-oophorectomy   FAMILY HISTORY: Family History  Problem Relation Age of Onset   Skin cancer Sister        non-melanoma dx. in her 62s   Other Sister        normal genetic testing for hereditary cancer risks   Colon cancer Paternal Grandfather        dx. in his 61s   Wilm's tumor Son        dx. 9 months   Breast cancer Other        dx. in her 23s (PGM's sister)   Wilm's tumor Nephew 2   Breast cancer Other        dx. in her 37s (PGM's niece)   Her father is 59 years old as of 07/2019. Her mother was murdered at age 37 (domestic violence).  The patient has 3 sisters and no brothers. She reports a third cousin (grandmother's sister's daughter) with breast cancer in her 54's. She denies a family history of ovarian, prostate, or pancreatic cancer. She does report colon cancer in her paternal grandfather in his 32's, non-melanoma skin cancer in her  sister in her 55's, and Wilms tumor in her son at 68 months.   GYNECOLOGIC HISTORY:  Patient's last menstrual period was 05/09/2011. Menarche: 71-61 years old Age at first live birth: 60 years old [place] P 3 LMP 2013 Contraceptive never used HRT used for approximately 9 months  Hysterectomy? Yes, 2013 BSO? yes   SOCIAL HISTORY: (updated 07/2019)  Debra [name] works as a Scientist, research (physical sciences). Husband Debra [name] is an Art gallery manager. She lives at home with husband Debra [name] Daughter Debra [name], age 26, is a poet and Pharmacist, hospital in [place], [place]. Son Debra [name], age 31, is an Production designer, theatre/television/film in [place] in [place], [place]. Son Debra [name], age 86, is a Marketing executive working in Artist in [place], [place] (at the [employer]). Debra [name] has three grandchildren. She is a Media planner.    ADVANCED DIRECTIVES: In the absence of any documentation to the contrary, the patient's spouse is their HCPOA.    HEALTH MAINTENANCE:  Social History   Tobacco Use   Smoking status: Former    Packs/day: 1.00    Years: 10.00    Pack years: 10.00    Types: Cigarettes    Quit date: 1992    Years since quitting: 30.8   Smokeless tobacco: Never  Vaping Use   Vaping Use: Never used  Substance Use Topics   Alcohol use: Yes    Alcohol/week: 2.0 standard drinks    Types: 2 Glasses of wine per week    Comment: occassionally   Drug use: Never     Colonoscopy: none on file  PAP: none on file (s/p hysterectomy)  Bone density: n/a (age)   Allergies  Allergen Reactions   Other Rash    Walnuts give her a rash    Walnuts give her a rash Walnuts give her a rash  Walnuts give her a rash Walnuts give her a rash Walnuts give her a rash Walnuts give her a rash    Current Outpatient Medications  Medication Sig Dispense Refill   b complex vitamins capsule Take 1 capsule by mouth daily.     Multiple Vitamin (MULTI-VITAMIN DAILY PO) Take 1 tablet by mouth daily.       tamoxifen (NOLVADEX) 20 MG tablet Take 1 tablet (20 mg total) by mouth daily. 90 tablet 4   No current facility-administered medications for this visit.   Facility-Administered Medications Ordered in Other Visits  Medication Dose Route Frequency Provider Last Rate Last Admin   heparin lock flush 100 unit/mL  500 Units Intracatheter Once PRN Reyes Aldaco, [name], MD       sodium chloride flush (NS) 0.9 % injection 10 mL  10 mL Intracatheter PRN Mickell Birdwell, [name], MD        OBJECTIVE: White woman who appears younger than stated age  Vitals:   03/17/21 1552  BP: (!) 137/52  Pulse: 77  Resp: 16  Temp: 98.1 F (36.7 C)  SpO2: 100%     Body mass index is 29.63 kg/m.   Wt Readings from Last 3 Encounters:  03/17/21 206 lb 8 oz (93.7 kg)  12/27/20 215 lb 6 oz (97.7 kg)  09/09/20 216 lb 6.4 oz (98.2 kg)     ECOG FS:1 - Symptomatic but completely ambulatory  Sclerae unicteric, EOMs intact Wearing a mask No cervical or supraclavicular adenopathy Lungs no rales or rhonchi Heart regular rate and rhythm Abd soft, nontender, positive bowel sounds MSK no focal spinal tenderness, no upper extremity lymphedema Neuro: nonfocal, well oriented, appropriate affect Breasts: The right breast is status postlumpectomy and radiation.  There is no evidence of local recurrence.  The left breast and both axillae are benign.   LAB RESULTS:  CMP     Component Value Date/Time   NA 141 03/15/2021 1558   K 3.9 03/15/2021 1558   CL 107 03/15/2021 1558   CO2 25 03/15/2021 1558   GLUCOSE 100 (H) 03/15/2021 1558   BUN 17 03/15/2021 1558   CREATININE 0.71 03/15/2021 1558   CREATININE 0.76 07/30/2019 0825   CALCIUM 9.0 03/15/2021 1558   PROT 6.8 03/15/2021 1558   ALBUMIN 3.8 03/15/2021 1558   AST 17 03/15/2021 1558   AST 14 (L) 07/30/2019 0825   ALT 10 03/15/2021 1558   ALT 11 07/30/2019 0825   ALKPHOS 62 03/15/2021 1558   BILITOT 0.3 03/15/2021 1558   BILITOT 0.4 07/30/2019 0825   GFRNONAA >60  03/15/2021 1558   GFRNONAA >60 07/30/2019 0825   GFRAA >60  01/07/2020 1055   GFRAA >60 07/30/2019 0825    No results found for: TOTALPROTELP, ALBUMINELP, A1GS, A2GS, BETS, BETA2SER, GAMS, MSPIKE, SPEI  Lab Results  Component Value Date   WBC 7.6 03/15/2021   NEUTROABS 4.8 03/15/2021   HGB 12.3 03/15/2021   HCT 37.8 03/15/2021   MCV 93.6 03/15/2021   PLT 186 03/15/2021    No results found for: LABCA2  No components found for: KXFGHW299  No results for input(s): INR in the last 168 hours.  No results found for: LABCA2  No results found for: BZJ696  No results found for: VEL381  No results found for: OFB510  No results found for: CA2729  No components found for: HGQUANT  No results found for: CEA1 / No results found for: CEA1   No results found for: AFPTUMOR  No results found for: CHROMOGRNA  No results found for: KPAFRELGTCHN, LAMBDASER, KAPLAMBRATIO (kappa/lambda light chains)  No results found for: HGBA, HGBA2QUANT, HGBFQUANT, HGBSQUAN (Hemoglobinopathy evaluation)   No results found for: LDH  Lab Results  Component Value Date   IRON 56 03/15/2021   TIBC 295 03/15/2021   IRONPCTSAT 19 (L) 03/15/2021   (Iron and TIBC)  Lab Results  Component Value Date   FERRITIN 309 (H) 03/15/2021    Urinalysis    Component Value Date/Time   COLORURINE YELLOW 11/26/2019 1319   APPEARANCEUR CLEAR 11/26/2019 1319   LABSPEC 1.030 11/26/2019 1319   PHURINE 5.0 11/26/2019 1319   GLUCOSEU NEGATIVE 11/26/2019 1319   HGBUR NEGATIVE 11/26/2019 1319   BILIRUBINUR NEGATIVE 11/26/2019 1319   KETONESUR NEGATIVE 11/26/2019 1319   PROTEINUR NEGATIVE 11/26/2019 1319   NITRITE NEGATIVE 11/26/2019 1319   LEUKOCYTESUR TRACE (A) 11/26/2019 1319    STUDIES: No results found.  ELIGIBLE FOR AVAILABLE RESEARCH PROTOCOL: AET  ASSESSMENT: 60 y.o. 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), with an MIB-1 of 90%  (1) neoadjuvant chemotherapy consisting of cyclophosphamide and doxorubicin in dose dense fashion x4 started 08/07/2019, completed 09/18/2019, followed by paclitaxel and carboplatin weekly started 10/08/2019  (a) echo 08/05/2019 showed an ejection fraction in the 60-65% range  (b) paclitaxel/carboplatinum discontinued after 9 doses (12/03/2019) secondary to grade 1 peripheral neuropathy  (2) status post right lumpectomy and sentinel lymph node sampling 01/08/2020 showing a complete pathologic response (ypT0 ypN0)  (a) a single right axillary lymph node was removed  (3) adjuvant radiation 02/12/2020 through 03/09/2020  (4) genetics testing 11/06/2019 through the [facility] Multi-Cancer Panel + Wilms Tumor Panel found no deleterious mutations in AIP, ALK, APC, ATM, AXIN2,BAP1,  BARD1, BLM, BMPR1A, BRCA1, BRCA2, BRIP1, CASR, CDC73, CDH1, CDK4, CDKN1B, CDKN1C, CDKN2A (p14ARF), CDKN2A (p16INK4a), CEBPA, CHEK2, CTNNA1, DICER1, DIS3L2, EGFR (c.2369C>T, p.Thr790Met variant only), EPCAM (Deletion/duplication testing only), FH, FLCN, GATA2, GPC3, GREM1 (Promoter region deletion/duplication testing only), HOXB13 (c.251G>A, p.Gly84Glu), HRAS, KIT, MAX, MEN1, MET, MITF (c.952G>A, p.Glu318Lys variant only), MLH1, MSH2, MSH3, MSH6, MUTYH, NBN, NF1, NF2, NTHL1, PALB2, PDGFRA, PHOX2B, PMS2, POLD1, POLE, POT1, PRKAR1A, PTCH1, PTEN, RAD50, RAD51C, RAD51D, RB1, RECQL4, RET, RNF43, RUNX1, SDHAF2, SDHA (sequence changes only), SDHB, SDHC, SDHD, SMAD4, SMARCA4, SMARCB1, SMARCE1, STK11, SUFU, TERC, TERT, TMEM127, TP53, TSC1, TSC2, VHL, WRN and WT1. The Wilms Tumor Panel offered by [facility] includes sequencing and deletion/duplication testing of the following 7 genes: CDC73, CDKN1C, CTR9, DIS3L2, GPC3, REST, and WT1.  (a) Three variants of uncertain significance were detected - one in  the AXIN2 gene called c.13A>G, one in the RNF43 gene  called c.1770G>C, and one in the [facility] gene called c.85C>G.   (5) on prophylactic tamoxifen started March 2022  (a) s/p hysterectomy  (b) s/p HRT w/o complications   PLAN: Debra [name] is now a little over a year out from definitive surgery for her breast cancer with no evidence of disease recurrence.  This is very febrile.  She is tolerating tamoxifen well and the plan will be to continue that a minimum of 5 years.  I reassured her that pain in the breast is almost never due to breast cancer and certainly in her case we have a good reason for her to have soreness namely the surgery and radiation she received.  Exam of the breast is unremarkable today.  I do not think we need to do any further evaluation at this point.  She is going to have her next mammogram in April.  I think she may have some bursitis in the right hip.  We are going to obtain a plain film today (I have reviewed the images which look normal to me but there is no reading at this point).  If the problem persist despite a normal hip film she may need to see an orthopedist for further evaluation  Otherwise she will see [place] again in May after her next mammogram.  She knows to call for any other issues that may develop before that visit  Total encounter time 25 minutes.Sarajane Jews C. Tiarrah Saville, MD 03/17/21 5:20 PM Medical Oncology and Hematology [facility] [place],  [address] Tel. [phone]    Fax. [phone]   I, Wilburn Mylar, am acting as scribe for Dr. [name]. Sharell Hilmer.  I, Lurline Del MD, have reviewed the above documentation for accuracy and completeness, and I agree with the above.   *Total Encounter Time as defined by the Centers for Medicare and Medicaid Services includes, in addition to the face-to-face time of a patient visit (documented in the note above) non-face-to-face time: obtaining and reviewing outside history, ordering and reviewing medications, tests or  procedures, care coordination (communications with other health care professionals or caregivers) and documentation in the medical record.

## 2021-03-17 ENCOUNTER — Other Ambulatory Visit: Payer: Self-pay

## 2021-03-17 ENCOUNTER — Ambulatory Visit (HOSPITAL_COMMUNITY)
Admission: RE | Admit: 2021-03-17 | Discharge: 2021-03-17 | Disposition: A | Discharge status: Home / Self Care | Payer: BC Managed Care – PPO | Source: Ambulatory Visit | Attending: Oncology | Admitting: Oncology

## 2021-03-17 ENCOUNTER — Inpatient Hospital Stay: Payer: BC Managed Care – PPO | Admitting: Oncology

## 2021-03-17 VITALS — BP 137/52 | HR 77 | Temp 98.1°F | Resp 16 | Ht 70.0 in | Wt 206.5 lb

## 2021-03-17 DIAGNOSIS — C50211 Malignant neoplasm of upper-inner quadrant of right female breast: Secondary | ICD-10-CM | POA: Insufficient documentation

## 2021-03-17 DIAGNOSIS — Z170 Estrogen receptor positive status [ER+]: Secondary | ICD-10-CM | POA: Diagnosis not present

## 2021-03-21 ENCOUNTER — Telehealth: Payer: Self-pay

## 2021-03-21 NOTE — Telephone Encounter (Signed)
Called pt, left VM stating that hip xray was normal.

## 2021-04-18 ENCOUNTER — Other Ambulatory Visit: Payer: Self-pay

## 2021-04-18 ENCOUNTER — Ambulatory Visit: Payer: BC Managed Care – PPO | Attending: Oncology

## 2021-04-18 VITALS — Wt 210.2 lb

## 2021-04-18 DIAGNOSIS — Z483 Aftercare following surgery for neoplasm: Secondary | ICD-10-CM | POA: Insufficient documentation

## 2021-04-18 NOTE — Therapy (Signed)
[place] @ [place] [place], [place], [address] Phone: [phone]   Fax:  [phone]  Physical Therapy Treatment  Patient Details  Name: Debra [name] MRN: [id number] Date of Birth: 1960-07-22 Referring Provider (PT): Yaakov Guthrie MD   Encounter Date: 04/18/2021   PT End of Session - 04/18/21 1702     Visit Number 9   # unchanged due to screen only   PT Start Time 1700    PT Stop Time 1705    PT Time Calculation (min) 5 min    Activity Tolerance Patient tolerated treatment well    Behavior During Therapy [facility] for tasks assessed/performed             Past Medical History:  Diagnosis Date   Cancer ([place])    right breast   Family history of adverse reaction to anesthesia    Post-op N/V   Family history of breast cancer    Family history of colon cancer    Family history of nonmelanoma skin cancer    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[place])    Personal history of chemotherapy    Personal history of radiation therapy     Past Surgical History:  Procedure Laterality Date   ABDOMINAL HYSTERECTOMY     BREAST LUMPECTOMY WITH RADIOACTIVE SEED AND SENTINEL LYMPH NODE BIOPSY Right 01/08/2020   Procedure: RIGHT BREAST LUMPECTOMY WITH RADIOACTIVE SEED AND SENTINEL LYMPH NODE BIOPSY;  Surgeon: [name], MD;  Location: [place];  Service: General;  Laterality: Right;  LMA, PECTORAL BLOCK   COLONOSCOPY     LUNG BIOPSY     Per Care Everywhere: RUL lung biopsy 05/02/17 showed necrotizing granulomas   PORT-A-CATH REMOVAL Left 04/05/2020   Procedure: REMOVAL PORT-A-CATH;  Surgeon: [name], MD;  Location: [employer];  Service: General;  Laterality: Left;   PORTACATH PLACEMENT Left 08/06/2019   Procedure: INSERTION PORT-A-CATH WITH ULTRASOUND GUIDANCE;  Surgeon: [name], MD;  Location: [place];  Service: General;  Laterality: Left;   WISDOM TOOTH  EXTRACTION      Vitals:   04/18/21 1701  Weight: 210 lb 4 oz (95.4 kg)     Subjective Assessment - 04/18/21 1701     Subjective Pt returns for her 3 month L-Dex screen.    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 Patient underwent neoadjuvant chemotherapy from 08/07/2019-12/03/2019. She had a right lumpectomy and sentinel node biopsy (1 negative node) on 01/08/2020.                    L-DEX FLOWSHEETS - 04/18/21 1700       L-DEX LYMPHEDEMA SCREENING   Measurement Type Unilateral    L-DEX MEASUREMENT EXTREMITY Upper Extremity    POSITION  Standing    DOMINANT SIDE Right    At Risk Side Right    BASELINE SCORE (UNILATERAL) 1.8                                            Plan - 04/18/21 1703     Clinical Impression Statement Pt returns for her 3 month L-Dex screen. Her change from baseline of -3.1 is WNLs so no further treatment is required at this time except to cont every 3 month L-Dex screens which pt  is agreeable to.    PT Next Visit Plan Cont every 3 month L-Dex screens for up to 2 years from [facility]. (~01/07/22)    Consulted and Agree with Plan of Care Patient             Patient will benefit from skilled therapeutic intervention in order to improve the following deficits and impairments:     Visit Diagnosis: Aftercare following surgery for neoplasm     Problem List Patient Active Problem List   Diagnosis Date Noted   Genetic testing 11/07/2019   Family history of breast cancer    Family history of colon cancer    Family history of nonmelanoma skin cancer    Chemotherapy induced neutropenia ([place]) 08/15/2019   Malignant neoplasm of upper-inner quadrant of right breast in female, estrogen receptor positive ([place]) 07/23/2019    [name], PTA 04/18/2021, 5:09 PM  [place] @ [place] [place], [place], [address] Phone: [phone]   Fax:  [phone]  Name: Debra [name] MRN: [id number] Date of Birth: April 13, 1961

## 2021-06-02 ENCOUNTER — Encounter (HOSPITAL_COMMUNITY): Payer: Self-pay

## 2021-07-18 ENCOUNTER — Ambulatory Visit: Payer: BC Managed Care – PPO | Attending: Adult Health | Admitting: Rehabilitation

## 2021-07-18 ENCOUNTER — Other Ambulatory Visit: Payer: Self-pay

## 2021-07-18 DIAGNOSIS — Z170 Estrogen receptor positive status [ER+]: Secondary | ICD-10-CM

## 2021-07-18 DIAGNOSIS — C50211 Malignant neoplasm of upper-inner quadrant of right female breast: Secondary | ICD-10-CM | POA: Insufficient documentation

## 2021-07-18 DIAGNOSIS — Z483 Aftercare following surgery for neoplasm: Secondary | ICD-10-CM

## 2021-07-18 NOTE — Therapy (Signed)
?  OUTPATIENT PHYSICAL THERAPY SOZO SCREENING NOTE ? ? ?Patient Name: Debra [name] ?MRN: [id number] ?DOB:08-15-1960, 61 y.o., female ?Today's Date: 07/18/2021 ? ?PCP: Vernie Shanks, MD ?REFERRING PROVIDER: Deanne Coffer* ? ? PT End of Session - 07/18/21 1643   ? ? Visit Number 9   screen only  ? PT Start Time 1640   ? PT Stop Time 1572   ? PT Time Calculation (min) 3 min   ? Activity Tolerance Patient tolerated treatment well   ? Behavior During Therapy [facility] for tasks assessed/performed   ? ?  ?  ? ?  ? ? ?Past Medical History:  ?Diagnosis Date  ? Cancer [facility])   ? right breast  ? Family history of adverse reaction to anesthesia   ? Post-op N/V  ? Family history of breast cancer   ? Family history of colon cancer   ? Family history of nonmelanoma skin cancer   ? 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[place])   ? Personal history of chemotherapy   ? Personal history of radiation therapy   ? ?Past Surgical History:  ?Procedure Laterality Date  ? ABDOMINAL HYSTERECTOMY    ? BREAST LUMPECTOMY WITH RADIOACTIVE SEED AND SENTINEL LYMPH NODE BIOPSY Right 01/08/2020  ? Procedure: RIGHT BREAST LUMPECTOMY WITH RADIOACTIVE SEED AND SENTINEL LYMPH NODE BIOPSY;  Surgeon: [name], MD;  Location: [place];  Service: General;  Laterality: Right;  LMA, PECTORAL BLOCK  ? COLONOSCOPY    ? LUNG BIOPSY    ? Per Care Everywhere: RUL lung biopsy 05/02/17 showed necrotizing granulomas  ? PORT-A-CATH REMOVAL Left 04/05/2020  ? Procedure: REMOVAL PORT-A-CATH;  Surgeon: [name], MD;  Location: [facility];  Service: General;  Laterality: Left;  ? PORTACATH PLACEMENT Left 08/06/2019  ? Procedure: INSERTION PORT-A-CATH WITH ULTRASOUND GUIDANCE;  Surgeon: [name], MD;  Location: [place];  Service: General;  Laterality: Left;  ? WISDOM TOOTH EXTRACTION    ? ?Patient Active Problem List  ? Diagnosis Date Noted  ? Genetic testing 11/07/2019  ? Family history of breast cancer   ?  Family history of colon cancer   ? Family history of nonmelanoma skin cancer   ? Chemotherapy induced neutropenia ([place]) 08/15/2019  ? Malignant neoplasm of upper-inner quadrant of right breast in female, estrogen receptor positive ([place]) 07/23/2019  ? ? ?REFERRING DIAG: Rt breast cancer ? ?THERAPY DIAG:  ?Aftercare following surgery for neoplasm ? ?Malignant neoplasm of upper-inner quadrant of right breast in female, estrogen receptor positive ([place]) ? ?PERTINENT HISTORY: Patient was diagnosed on 07/15/2019 with right grade III functionally triple negative invasive ductal carcinoma breast cancer. Patient underwent neoadjuvant chemotherapy from 08/07/2019-12/03/2019. She had a right lumpectomy and sentinel node biopsy (1 negative node) on 01/08/2020.  ? ?PRECAUTIONS: Rt UE lymphedema risk ? ?SUBJECTIVE: no c/o ? ?PAIN:  ?Are you having pain? No ? ?SOZO SCREENING: ?Patient was assessed today using the SOZO machine to determine the lymphedema index score. This was compared to her baseline score. It was determined that she is within the recommended range when compared to her baseline and no further action is needed at this time. She will return in 3 months for her next SOZO screen. ? ?Plan: continue 3 month SOZO screens until at least 01/07/22 and then every 6 months x 2 years  ? ?Shan Levans, PT ? ?07/18/2021, 4:44 PM ? ?  ? ?

## 2021-08-23 ENCOUNTER — Telehealth: Payer: Self-pay | Admitting: *Deleted

## 2021-08-23 NOTE — Telephone Encounter (Signed)
Patient called -  ?Past patient of Dr. Jana Hakim ?Appt with Dr. [name] on 5/11 ?She requested an order for mammogram so she can get that done before her appt ?Dr. [name] informed ?

## 2021-08-24 ENCOUNTER — Other Ambulatory Visit: Payer: Self-pay

## 2021-08-24 DIAGNOSIS — C50211 Malignant neoplasm of upper-inner quadrant of right female breast: Secondary | ICD-10-CM

## 2021-09-09 ENCOUNTER — Ambulatory Visit
Admission: RE | Admit: 2021-09-09 | Discharge: 2021-09-09 | Disposition: A | Discharge status: Home / Self Care | Payer: BC Managed Care – PPO | Source: Ambulatory Visit | Attending: Hematology and Oncology | Admitting: Hematology and Oncology

## 2021-09-09 DIAGNOSIS — Z170 Estrogen receptor positive status [ER+]: Secondary | ICD-10-CM

## 2021-09-15 ENCOUNTER — Other Ambulatory Visit: Payer: Self-pay

## 2021-09-15 ENCOUNTER — Inpatient Hospital Stay: Payer: BC Managed Care – PPO | Attending: Hematology and Oncology

## 2021-09-15 ENCOUNTER — Other Ambulatory Visit: Payer: Self-pay | Admitting: *Deleted

## 2021-09-15 ENCOUNTER — Inpatient Hospital Stay: Payer: BC Managed Care – PPO | Admitting: Hematology and Oncology

## 2021-09-15 VITALS — BP 139/61 | HR 73 | Temp 98.1°F | Resp 15 | Wt 209.9 lb

## 2021-09-15 DIAGNOSIS — Z170 Estrogen receptor positive status [ER+]: Secondary | ICD-10-CM

## 2021-09-15 DIAGNOSIS — Z800 Family history of malignant neoplasm of digestive organs: Secondary | ICD-10-CM | POA: Diagnosis not present

## 2021-09-15 DIAGNOSIS — G629 Polyneuropathy, unspecified: Secondary | ICD-10-CM | POA: Insufficient documentation

## 2021-09-15 DIAGNOSIS — Z9221 Personal history of antineoplastic chemotherapy: Secondary | ICD-10-CM | POA: Insufficient documentation

## 2021-09-15 DIAGNOSIS — Z171 Estrogen receptor negative status [ER-]: Secondary | ICD-10-CM | POA: Insufficient documentation

## 2021-09-15 DIAGNOSIS — D649 Anemia, unspecified: Secondary | ICD-10-CM | POA: Diagnosis not present

## 2021-09-15 DIAGNOSIS — Z923 Personal history of irradiation: Secondary | ICD-10-CM | POA: Diagnosis not present

## 2021-09-15 DIAGNOSIS — Z79810 Long term (current) use of selective estrogen receptor modulators (SERMs): Secondary | ICD-10-CM | POA: Diagnosis not present

## 2021-09-15 DIAGNOSIS — T451X5A Adverse effect of antineoplastic and immunosuppressive drugs, initial encounter: Secondary | ICD-10-CM

## 2021-09-15 DIAGNOSIS — C50211 Malignant neoplasm of upper-inner quadrant of right female breast: Secondary | ICD-10-CM

## 2021-09-15 DIAGNOSIS — Z803 Family history of malignant neoplasm of breast: Secondary | ICD-10-CM | POA: Insufficient documentation

## 2021-09-15 LAB — CMP (CANCER CENTER ONLY)
ALT: 13 U/L (ref 0–44)
AST: 17 U/L (ref 15–41)
Albumin: 4.1 g/dL (ref 3.5–5.0)
Alkaline Phosphatase: 55 U/L (ref 38–126)
Anion gap: 6 (ref 5–15)
BUN: 22 mg/dL (ref 8–23)
CO2: 27 mmol/L (ref 22–32)
Calcium: 9.1 mg/dL (ref 8.9–10.3)
Chloride: 109 mmol/L (ref 98–111)
Creatinine: 0.82 mg/dL (ref 0.44–1.00)
GFR, Estimated: >60 mL/min (ref >60)
Glucose, Bld: 109 mg/dL — ABNORMAL HIGH (ref 70–99)
Potassium: 3.8 mmol/L (ref 3.5–5.1)
Sodium: 142 mmol/L (ref 135–145)
Total Bilirubin: 0.3 mg/dL (ref 0.3–1.2)
Total Protein: 6.9 g/dL (ref 6.5–8.1)

## 2021-09-15 LAB — CBC WITH DIFFERENTIAL (CANCER CENTER ONLY)
Abs Immature Granulocytes: 0.01 10*3/uL (ref 0.00–0.07)
Basophils Absolute: 0 10*3/uL (ref 0.0–0.1)
Basophils Relative: 0 %
Eosinophils Absolute: 0.1 10*3/uL (ref 0.0–0.5)
Eosinophils Relative: 2 %
HCT: 36.6 % (ref 36.0–46.0)
Hemoglobin: 12.2 g/dL (ref 12.0–15.0)
Immature Granulocytes: 0 %
Lymphocytes Relative: 25 %
Lymphs Abs: 1.8 10*3/uL (ref 0.7–4.0)
MCH: 30.9 pg (ref 26.0–34.0)
MCHC: 33.3 g/dL (ref 30.0–36.0)
MCV: 92.7 fL (ref 80.0–100.0)
Monocytes Absolute: 0.4 10*3/uL (ref 0.1–1.0)
Monocytes Relative: 6 %
Neutro Abs: 4.7 10*3/uL (ref 1.7–7.7)
Neutrophils Relative %: 67 %
Platelet Count: 224 10*3/uL (ref 150–400)
RBC: 3.95 MIL/uL (ref 3.87–5.11)
RDW: 12.4 % (ref 11.5–15.5)
WBC Count: 7 10*3/uL (ref 4.0–10.5)
nRBC: 0 % (ref 0.0–0.2)

## 2021-09-15 LAB — SEDIMENTATION RATE: Sed Rate: 14 mm/hr (ref 0–22)

## 2021-09-15 MED ORDER — TAMOXIFEN CITRATE 20 MG PO TABS: 20.0000 mg | ORAL_TABLET | Freq: Every day | ORAL | 4 refills | Status: DC

## 2021-09-15 NOTE — Progress Notes (Signed)
?[place]  ?[phone] [phone]  ? ? ? ?ID: Debra [name] DOB: [birth year]  MR#: [id number]  QAS#:[id number] ? ?Patient Care Team: ?Vernie Shanks, MD as PCP - General (Family Medicine) ?[name], RN as Oncology Nurse Navigator ?[name], RN as Oncology Nurse Navigator ?[name], MD as Consulting Physician (General Surgery) ?[name], MD as Consulting Physician (Radiation Oncology) ?Benay Pike, MD as Consulting Physician (Hematology and Oncology) ?Benay Pike, MD ?OTHER MD: ? 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 ?CURRENT TREATMENT: Tamoxifen ? ? ?INTERVAL HISTORY: ? 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  ?She is tolerating tamoxifen remarkably well, with no significant hot flashes or vaginal wetness issues. ?During her last visit, Dr. Jana Hakim also ordered some evaluation for anemia which has now resolved.  No evidence of iron, N98 or folic acid deficiency.  She is however wondering if the ESR can be repeated since she was asked to keep an eye on it. ?Her last mammogram did not show any evidence of malignancy but she is very worried about having to wait for a whole year before she gets another mammogram.   ?She is wondering if she can get MRI screening as well as mammogram screening for breast cancer.  Most recent mammogram showed breast density of category B. ?She is also wondering if she can be referred to gastroenterology and dermatology for colonoscopy and annual dermatological exam ?She also complains of ongoing breast soreness inside the right breast. ?Rest of the pertinent 10 point ROS reviewed and negative ? ? COVID 19 VACCINATION STATUS: Status post Pfizer x2, status post infection June 2022 ? ?HISTORY OF CURRENT ILLNESS: ?From the original intake note: ? ?Moya herself palpated a mass in the upper-inner right breast in February 2021.  It appeared to  increase in size over the next 3 weeks. Physical exam performed at [facility] 07/15/2019 confirmed a 6 mm firm, oval, palpable mass in the upper-inner right breast. She underwent bilateral diagnostic mammography with tomography and bilateral breast ultrasonography on 07/15/2019 showing: breast density category C; 4.6 cm mass in the right breast at 1 o'clock; single abnormal-appearing lymph node; bilateral benign cysts. ? ?Accordingly on 07/21/2019 she proceeded to biopsy of the right breast area in question. 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 Prognostic indicators significant for: estrogen receptor, 60% positive with weak staining intensity and progesterone receptor, 0% negative. Proliferation marker Ki67 at 90%. HER2 equivocal by immunohistochemistry (2+), but negative by fluorescent in situ hybridization with a signals ratio 1.24 and number per cell 2.55. ? ?The questionable right axillary lymph node was biopsied as well and was benign (concordant). ? ?The patient's subsequent history is as detailed below. ? ? ?PAST MEDICAL HISTORY: ?Past Medical History:  ?Diagnosis Date  ? Cancer [facility])   ? right breast  ? Family history of adverse reaction to anesthesia   ? Post-op N/V  ? Family history of breast cancer   ? Family history of colon cancer   ? Family history of nonmelanoma skin cancer   ? 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 Personal history of chemotherapy   ? Personal history of radiation therapy   ? ? ?PAST SURGICAL HISTORY: ?Past Surgical History:  ?Procedure Laterality Date  ? ABDOMINAL HYSTERECTOMY    ? BREAST LUMPECTOMY WITH RADIOACTIVE SEED AND SENTINEL LYMPH NODE BIOPSY Right 01/08/2020  ?  Procedure: RIGHT BREAST LUMPECTOMY WITH RADIOACTIVE SEED AND SENTINEL LYMPH NODE BIOPSY;  Surgeon: [name], MD;  Location: [place];  Service: General;  Laterality: Right;  LMA, PECTORAL  BLOCK  ? COLONOSCOPY    ? LUNG BIOPSY    ? Per Care Everywhere: RUL lung biopsy 05/02/17 showed necrotizing granulomas  ? PORT-A-CATH REMOVAL Left 04/05/2020  ? Procedure: REMOVAL PORT-A-CATH;  Surgeon: [name], MD;  Location: [place];  Service: General;  Laterality: Left;  ? PORTACATH PLACEMENT Left 08/06/2019  ? Procedure: INSERTION PORT-A-CATH WITH ULTRASOUND GUIDANCE;  Surgeon: [name], MD;  Location: [place];  Service: General;  Laterality: Left;  ? WISDOM TOOTH EXTRACTION    ?Status post bilateral salpingo-oophorectomy ? ? ?FAMILY HISTORY: ?Family History  ?Problem Relation Age of Onset  ? Skin cancer Sister   ?     non-melanoma dx. in her 45s  ? Other Sister   ?     normal genetic testing for hereditary cancer risks  ? Colon cancer Paternal Grandfather   ?     dx. in his 71s  ? Wilm's tumor Son   ?     dx. 9 months  ? Breast cancer Other   ?     dx. in her 61s (PGM's sister)  ? Wilm's tumor Nephew 2  ? Breast cancer Other   ?     dx. in her 61s (PGM's niece)  ? Her father is 42 years old as of 07/2019. Her mother was murdered at age 48 (domestic violence).  The patient has 3 sisters and no brothers. She reports a third cousin (grandmother's sister's daughter) with breast cancer in her 72's. She denies a family history of ovarian, prostate, or pancreatic cancer. She does report colon cancer in her paternal grandfather in his 47's, non-melanoma skin cancer in her sister in her 32's, and Wilms tumor in her son at 71 months. ? ? ?GYNECOLOGIC HISTORY:  ?Patient's last menstrual period was 05/09/2011. ?Menarche: 26-69 years old ?Age at first live birth: 61 years old ?GX P 3 ?LMP 2013 ?Contraceptive never used ?HRT used for approximately 9 months  ?Hysterectomy? Yes, 2013 ?BSO? yes ? ? ?SOCIAL HISTORY: (updated 07/2019)  ?Debra [name] works as a Scientist, research (physical sciences). Husband Debra [name] is an Art gallery manager. She lives at home with husband Debra [name] Daughter Debra [name],  age 5, is a poet and Pharmacist, hospital in [place], [place]. Son Debra [name], age 73, is an Production designer, theatre/television/film in [place] in [place], [place]. Son Lesta Limbert, age 67, is a Marketing executive working in Artist in [place], [place] (at the [employer]). Margia has three grandchildren. She is a Media planner. ?  ? ADVANCED DIRECTIVES: In the absence of any documentation to the contrary, the patient's spouse is their HCPOA.  ? ? ?HEALTH MAINTENANCE: ?Social History  ? ?Tobacco Use  ? Smoking status: Former  ?  Packs/day: 1.00  ?  Years: 10.00  ?  Pack years: 10.00  ?  Types: Cigarettes  ?  Quit date: 59  ?  Years since quitting: 31.3  ? Smokeless tobacco: Never  ?Vaping Use  ? Vaping Use: Never used  ?Substance Use Topics  ? Alcohol use: Yes  ?  Alcohol/week: 2.0 standard drinks  ?  Types: 2 Glasses of wine per week  ?  Comment: occassionally  ? Drug use: Never  ? ? ? Colonoscopy: none on file ? PAP: none on file (s/p hysterectomy) ? Bone density: n/a (age) ?  ?  Allergies  ?Allergen Reactions  ? Other Rash  ?  Walnuts give her a rash ?  ? ?Walnuts give her a rash ?Walnuts give her a rash ? ?Walnuts give her a rash ?Walnuts give her a rash ?Walnuts give her a rash ?Walnuts give her a rash  ? ? ?Current Outpatient Medications  ?Medication Sig Dispense Refill  ? b complex vitamins capsule Take 1 capsule by mouth daily.    ? Multiple Vitamin (MULTI-VITAMIN DAILY PO) Take 1 tablet by mouth daily.     ? tamoxifen (NOLVADEX) 20 MG tablet Take 1 tablet (20 mg total) by mouth daily. 90 tablet 4  ? ?No current facility-administered medications for this visit.  ? ?Facility-Administered Medications Ordered in Other Visits  ?Medication Dose Route Frequency Provider Last Rate Last Admin  ? heparin lock flush 100 unit/mL  500 Units Intracatheter Once PRN Magrinat, [name], MD      ? sodium chloride flush (NS) 0.9 % injection 10 mL  10 mL Intracatheter PRN Magrinat, [name], MD      ? ? ?OBJECTIVE: White woman  who appears younger than stated age ? ?Vitals:  ? 09/15/21 1545  ?BP: 139/61  ?Pulse: 73  ?Resp: 15  ?Temp: 98.1 ?F (36.7 ?C)  ?SpO2: 100%  ?   Body mass index is 30.12 kg/m?.   ?Wt Readings from Last

## 2021-09-29 ENCOUNTER — Telehealth: Payer: Self-pay | Admitting: Internal Medicine

## 2021-09-29 NOTE — Telephone Encounter (Signed)
Good afternoon Dr. Hilarie Fredrickson,  (DoD 09/29/21, p.m.)  This patient has been referred to our practice for a transfer of care.  Her last colonoscopy was in 2018 in [place] (records are in [place]) and she has since moved to [facility] and is looking to establish with a GI doc here.  Please let me know if you approve of this transfer.  Thank you.

## 2021-09-29 NOTE — Telephone Encounter (Signed)
According to last colonoscopy report 5-year interval from 2018 was recommended due to family history Okay to schedule

## 2021-09-30 ENCOUNTER — Encounter: Payer: Self-pay | Admitting: Internal Medicine

## 2021-11-15 ENCOUNTER — Encounter: Payer: Self-pay | Admitting: Oncology

## 2021-11-15 ENCOUNTER — Ambulatory Visit (AMBULATORY_SURGERY_CENTER): Payer: Self-pay | Admitting: *Deleted

## 2021-11-15 VITALS — Ht 70.0 in | Wt 213.0 lb

## 2021-11-15 DIAGNOSIS — Z8371 Family history of colonic polyps: Secondary | ICD-10-CM

## 2021-11-15 DIAGNOSIS — Z800 Family history of malignant neoplasm of digestive organs: Secondary | ICD-10-CM

## 2021-11-15 MED ORDER — NA SULFATE-K SULFATE-MG SULF 17.5-3.13-1.6 GM/177ML PO SOLN: 1.0000 | Freq: Once | ORAL | 0 refills | Status: AC

## 2021-11-15 NOTE — Progress Notes (Signed)
No egg or soy allergy known to patient  No issues known to pt with past sedation with any surgeries or procedures Patient denies ever being told they had issues or difficulty with intubation  No FH of Malignant Hyperthermia Pt is not on diet pills Pt is not on  home 02  Pt is not on blood thinners  Pt denies issues with constipation  No A fib or A flutter Have any cardiac testing pending--pt denies  Pt instructed to use Singlecare.com or GoodRx for a price reduction on prep

## 2021-11-28 ENCOUNTER — Encounter: Payer: Self-pay | Admitting: Internal Medicine

## 2021-12-02 ENCOUNTER — Ambulatory Visit (AMBULATORY_SURGERY_CENTER): Payer: BC Managed Care – PPO | Admitting: Internal Medicine

## 2021-12-02 ENCOUNTER — Encounter: Payer: Self-pay | Admitting: Internal Medicine

## 2021-12-02 VITALS — BP 134/54 | HR 59 | Temp 98.4°F | Resp 10 | Ht 70.0 in | Wt 213.0 lb

## 2021-12-02 DIAGNOSIS — Z09 Encounter for follow-up examination after completed treatment for conditions other than malignant neoplasm: Secondary | ICD-10-CM | POA: Diagnosis present

## 2021-12-02 DIAGNOSIS — Z8371 Family history of colonic polyps: Secondary | ICD-10-CM | POA: Diagnosis not present

## 2021-12-02 DIAGNOSIS — Z800 Family history of malignant neoplasm of digestive organs: Secondary | ICD-10-CM

## 2021-12-02 DIAGNOSIS — D123 Benign neoplasm of transverse colon: Secondary | ICD-10-CM

## 2021-12-02 DIAGNOSIS — Z86010 Personal history of colonic polyps: Secondary | ICD-10-CM

## 2021-12-02 MED ORDER — SODIUM CHLORIDE 0.9 % IV SOLN: 500.0000 mL | Freq: Once | INTRAVENOUS | Status: DC

## 2021-12-02 NOTE — Progress Notes (Signed)
Called to room to assist during endoscopic procedure.  Patient ID and intended procedure confirmed with present staff. Received instructions for my participation in the procedure from the performing physician.  

## 2021-12-02 NOTE — Progress Notes (Signed)
GASTROENTEROLOGY PROCEDURE H&P NOTE   Primary Care Physician: Vernie Shanks, MD (Inactive)    Reason for Procedure:  History of adenomatous colon polyp  Plan:    Colonoscopy  Patient is appropriate for endoscopic procedure(s) in the ambulatory ([place]) setting.  The nature of the procedure, as well as the risks, benefits, and alternatives were carefully and thoroughly reviewed with the patient. Ample time for discussion and questions allowed. The patient understood, was satisfied, and agreed to proceed.     HPI: Debra [name] is a 61 y.o. female who presents for surveillance colonoscopy.  Medical history as below.  Tolerated the prep.  No recent chest pain or shortness of breath.  No abdominal pain today.  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[place])    Personal history of chemotherapy    completed 09-18-2019   Personal history of radiation therapy    completed 03-09-2020    Past Surgical History:  Procedure Laterality Date   ABDOMINAL HYSTERECTOMY     BREAST LUMPECTOMY WITH RADIOACTIVE SEED AND SENTINEL LYMPH NODE BIOPSY Right 01/08/2020   Procedure: RIGHT BREAST LUMPECTOMY WITH RADIOACTIVE SEED AND SENTINEL LYMPH NODE BIOPSY;  Surgeon: [name], MD;  Location: [facility];  Service: General;  Laterality: Right;  LMA, PECTORAL BLOCK   COLONOSCOPY     LUNG BIOPSY     Per Care Everywhere: RUL lung biopsy 05/02/17 showed necrotizing granulomas   PORT-A-CATH REMOVAL Left 04/05/2020   Procedure: REMOVAL PORT-A-CATH;  Surgeon: [name], MD;  Location: [facility] [place];  Service: General;  Laterality: Left;   PORTACATH PLACEMENT Left 08/06/2019    Procedure: INSERTION PORT-A-CATH WITH ULTRASOUND GUIDANCE;  Surgeon: [name], MD;  Location: [place] [facility];  Service: General;  Laterality: Left;   WISDOM TOOTH EXTRACTION      Prior to Admission medications   Medication Sig Start Date End Date Taking? Authorizing Provider  b complex vitamins capsule Take 1 capsule by mouth daily.   Yes [provider]  Multiple Vitamin (MULTI-VITAMIN DAILY PO) Take 1 tablet by mouth daily.    Yes [provider]  tamoxifen (NOLVADEX) 20 MG tablet Take 1 tablet (20 mg total) by mouth daily. 09/15/21  Yes Benay Pike, MD  prochlorperazine (COMPAZINE) 10 MG tablet Take 1 tablet (10 mg total) by mouth every 6 (six) hours as needed (Nausea or vomiting). 07/30/19 12/11/19  Magrinat, [name], MD    Current Outpatient Medications  Medication Sig Dispense Refill   b complex vitamins capsule Take 1 capsule by mouth daily.     Multiple Vitamin (MULTI-VITAMIN DAILY PO) Take 1 tablet by mouth daily.      tamoxifen (NOLVADEX) 20 MG tablet Take 1 tablet (20 mg total) by mouth daily. 90 tablet 4   Current Facility-Administered Medications  Medication Dose Route Frequency Provider Last Rate Last Admin   0.9 %  sodium chloride infusion  500 mL Intravenous Once Levar Fayson, Lajuan Lines, MD       Facility-Administered Medications Ordered in Other Visits  Medication Dose Route Frequency Provider Last Rate Last Admin   heparin lock flush 100 unit/mL  500  Units Intracatheter Once PRN Magrinat, [name], MD       sodium chloride flush (NS) 0.9 % injection 10 mL  10 mL Intracatheter PRN Magrinat, [name], MD        Allergies as of 12/02/2021 - Review Complete 12/02/2021  Allergen Reaction Noted   Other Rash 03/07/2011    Family History  Problem Relation Age of Onset   Colon polyps Father    Colon polyps Sister    Skin cancer Sister        non-melanoma dx. in her 45s   Other Sister        normal genetic testing for hereditary cancer  risks   Colon polyps Sister    Colon polyps Sister    Colon cancer Paternal Grandfather        dx. in his 80s   Wilm's tumor Son        dx. 9 months   Wilm's tumor Nephew 2   Breast cancer Other        dx. in her 57s (PGM's sister)   Breast cancer Other        dx. in her 42s (PGM's niece)   Esophageal cancer Neg Hx    Rectal cancer Neg Hx    Stomach cancer Neg Hx     Social History   Socioeconomic History   Marital status: Married    Spouse name: Not on file   Number of children: Not on file   Years of education: Not on file   Highest education level: Not on file  Occupational History   Not on file  Tobacco Use   Smoking status: Former    Packs/day: 1.00    Years: 10.00    Total pack years: 10.00    Types: Cigarettes    Quit date: 98    Years since quitting: 31.5   Smokeless tobacco: Never  Vaping Use   Vaping Use: Never used  Substance and Sexual Activity   Alcohol use: Yes    Alcohol/week: 2.0 standard drinks of alcohol    Types: 2 Glasses of wine per week    Comment: occassionally   Drug use: Never   Sexual activity: Yes    Birth control/protection: Surgical    Comment: Hysterectomy  Other Topics Concern   Not on file  Social History Narrative   Not on file   Social Determinants of Health   Financial Resource Strain: Not on file  Food Insecurity: Not on file  Transportation Needs: Not on file  Physical Activity: Not on file  Stress: Not on file  Social Connections: Not on file  Intimate Partner Violence: Not on file    Physical Exam: Vital signs in last 24 hours: '@BP'$  (!) 154/84   Pulse 66   Temp 98.4 F (36.9 C)   Ht '5\' 10"'$  (1.778 m)   Wt 213 lb (96.6 kg)   LMP 05/09/2011   SpO2 99%   BMI 30.56 kg/m  GEN: NAD EYE: Sclerae anicteric ENT: MMM CV: Non-tachycardic Pulm: CTA b/l GI: Soft, NT/ND NEURO:  Alert & Oriented x 3   Zenovia Jarred, MD [facility]  12/02/2021 9:39 AM

## 2021-12-02 NOTE — Progress Notes (Signed)
Pt's states no medical or surgical changes since previsit or office visit. 

## 2021-12-02 NOTE — Progress Notes (Signed)
VSS, transported to PACU °

## 2021-12-02 NOTE — Patient Instructions (Signed)
Await pathology results.  Handout on polyps and diverticulosis given.  YOU HAD AN ENDOSCOPIC PROCEDURE TODAY AT THE [facility]:   Refer to the procedure report that was given to you for any specific questions about what was found during the examination.  If the procedure report does not answer your questions, please call your gastroenterologist to clarify.  If you requested that your care partner not be given the details of your procedure findings, then the procedure report has been included in a sealed envelope for you to review at your convenience later.  YOU SHOULD EXPECT: Some feelings of bloating in the abdomen. Passage of more gas than usual.  Walking can help get rid of the air that was put into your GI tract during the procedure and reduce the bloating. If you had a lower endoscopy (such as a colonoscopy or flexible sigmoidoscopy) you may notice spotting of blood in your stool or on the toilet paper. If you underwent a bowel prep for your procedure, you may not have a normal bowel movement for a few days.  Please Note:  You might notice some irritation and congestion in your nose or some drainage.  This is from the oxygen used during your procedure.  There is no need for concern and it should clear up in a day or so.  SYMPTOMS TO REPORT IMMEDIATELY:  Following lower endoscopy (colonoscopy or flexible sigmoidoscopy):  Excessive amounts of blood in the stool  Significant tenderness or worsening of abdominal pains  Swelling of the abdomen that is new, acute  Fever of 100F or higher  For urgent or emergent issues, a gastroenterologist can be reached at any hour by calling [phone]. Do not use MyChart messaging for urgent concerns.    DIET:  We do recommend a small meal at first, but then you may proceed to your regular diet.  Drink plenty of fluids but you should avoid alcoholic beverages for 24 hours.  ACTIVITY:  You should plan to take it easy for the rest of today  and you should NOT DRIVE or use heavy machinery until tomorrow (because of the sedation medicines used during the test).    FOLLOW UP: Our staff will call the number listed on your records the next business day following your procedure.  We will call around 7:15- 8:00 am to check on you and address any questions or concerns that you may have regarding the information given to you following your procedure. If we do not reach you, we will leave a message.  If you develop any symptoms (ie: fever, flu-like symptoms, shortness of breath, cough etc.) before then, please call [phone].  If you test positive for Covid 19 in the 2 weeks post procedure, please call and report this information to us.    If any biopsies were taken you will be contacted by phone or by letter within the next 1-3 weeks.  Please call us at [phone] if you have not heard about the biopsies in 3 weeks.    SIGNATURES/CONFIDENTIALITY: You and/or your care partner have signed paperwork which will be entered into your electronic medical record.  These signatures attest to the fact that that the information above on your After Visit Summary has been reviewed and is understood.  Full responsibility of the confidentiality of this discharge information lies with you and/or your care-partner.  

## 2021-12-02 NOTE — Op Note (Signed)
[facility] Patient Name: Debra [name] Procedure Date: 12/02/2021 9:46 AM MRN: [id number] Endoscopist: Jerene Bears , MD Age: 61 Referring MD:  Date of Birth: 18-Oct-1960 Gender: Female Account #: [id number] Procedure:                Colonoscopy Indications:              High risk colon cancer surveillance: Personal                            history of non-advanced adenoma; family hx of colon                            polyps (multiple sisters) Medicines:                Monitored Anesthesia Care Procedure:                Pre-Anesthesia Assessment:                           - Prior to the procedure, a History and Physical                            was performed, and patient medications and                            allergies were reviewed. The patient's tolerance of                            previous anesthesia was also reviewed. The risks                            and benefits of the procedure and the sedation                            options and risks were discussed with the patient.                            All questions were answered, and informed consent                            was obtained. Prior Anticoagulants: The patient has                            taken no previous anticoagulant or antiplatelet                            agents. ASA Grade Assessment: II - A patient with                            mild systemic disease. After reviewing the risks                            and benefits, the patient was deemed in  satisfactory condition to undergo the procedure.                           After obtaining informed consent, the colonoscope                            was passed under direct vision. Throughout the                            procedure, the patient's blood pressure, pulse, and                            oxygen saturations were monitored continuously. The                            Olympus PCF-H190DL ([id number])  Colonoscope was                            introduced through the anus and advanced to the                            cecum, identified by appendiceal orifice and                            ileocecal valve. The colonoscopy was performed                            without difficulty. The patient tolerated the                            procedure well. The quality of the bowel                            preparation was good. The ileocecal valve,                            appendiceal orifice, and rectum were photographed. Scope In: 9:50:47 AM Scope Out: 10:03:49 AM Scope Withdrawal Time: 0 hours 10 minutes 45 seconds  Total Procedure Duration: 0 hours 13 minutes 2 seconds  Findings:                 The digital rectal exam was normal.                           A single small angioectasia with typical                            arborization was found in the cecum.                           A 3 mm polyp was found in the transverse colon. The                            polyp was sessile. The polyp was removed with a  cold biopsy forceps. Resection and retrieval were                            complete.                           Multiple small-mouthed diverticula were found in                            the sigmoid colon.                           The exam was otherwise without abnormality on                            direct and retroflexion views. Complications:            No immediate complications. Estimated Blood Loss:     Estimated blood loss: none. Impression:               - A single, small, cecal angioectasia.                           - One 3 mm polyp in the transverse colon, removed                            with a cold biopsy forceps. Resected and retrieved.                           - Diverticulosis in the sigmoid colon.                           - The examination was otherwise normal on direct                            and retroflexion  views. Recommendation:           - Patient has a contact number available for                            emergencies. The signs and symptoms of potential                            delayed complications were discussed with the                            patient. Return to normal activities tomorrow.                            Written discharge instructions were provided to the                            patient.                           - Resume previous diet.                           -  Continue present medications.                           - Await pathology results.                           - Repeat colonoscopy is recommended for                            surveillance. The colonoscopy date will be                            determined after pathology results from today's                            exam become available for review. Jerene Bears, MD 12/02/2021 10:07:29 AM This report has been signed electronically.

## 2021-12-05 ENCOUNTER — Telehealth: Payer: Self-pay

## 2021-12-05 NOTE — Telephone Encounter (Signed)
  Follow up Call-     12/02/2021    9:22 AM  Call back number  Post procedure Call Back phone  # [phone]  Permission to leave phone message Yes     Patient questions:  Do you have a fever, pain , or abdominal swelling? No. Pain Score  0 *  Have you tolerated food without any problems? Yes.    Have you been able to return to your normal activities? Yes.    Do you have any questions about your discharge instructions: Diet   No. Medications  No. Follow up visit  No.  Do you have questions or concerns about your Care? No.  Actions: * If pain score is 4 or above: No action needed, pain <4.

## 2021-12-06 ENCOUNTER — Encounter: Payer: Self-pay | Admitting: Internal Medicine

## 2021-12-19 ENCOUNTER — Ambulatory Visit: Payer: BC Managed Care – PPO | Attending: Adult Health

## 2021-12-19 DIAGNOSIS — Z170 Estrogen receptor positive status [ER+]: Secondary | ICD-10-CM | POA: Insufficient documentation

## 2021-12-19 DIAGNOSIS — C50211 Malignant neoplasm of upper-inner quadrant of right female breast: Secondary | ICD-10-CM | POA: Insufficient documentation

## 2021-12-19 DIAGNOSIS — Z483 Aftercare following surgery for neoplasm: Secondary | ICD-10-CM | POA: Insufficient documentation

## 2022-03-15 ENCOUNTER — Ambulatory Visit (HOSPITAL_COMMUNITY)
Admission: RE | Admit: 2022-03-15 | Discharge: 2022-03-15 | Disposition: A | Discharge status: Home / Self Care | Payer: BC Managed Care – PPO | Source: Ambulatory Visit | Attending: Hematology and Oncology | Admitting: Hematology and Oncology

## 2022-03-15 DIAGNOSIS — Z170 Estrogen receptor positive status [ER+]: Secondary | ICD-10-CM | POA: Diagnosis present

## 2022-03-15 DIAGNOSIS — C50211 Malignant neoplasm of upper-inner quadrant of right female breast: Secondary | ICD-10-CM | POA: Diagnosis present

## 2022-03-15 MED ORDER — GADOBUTROL 1 MMOL/ML IV SOLN
9.5000 mL | Freq: Once | INTRAVENOUS | Status: AC | PRN
  Administered 2022-03-15: 9.5 mL via INTRAVENOUS

## 2022-03-21 ENCOUNTER — Inpatient Hospital Stay: Payer: BC Managed Care – PPO | Attending: Hematology and Oncology | Admitting: Hematology and Oncology

## 2022-03-21 ENCOUNTER — Encounter: Payer: Self-pay | Admitting: Hematology and Oncology

## 2022-03-21 ENCOUNTER — Other Ambulatory Visit: Payer: Self-pay

## 2022-03-21 VITALS — BP 144/71 | HR 70 | Temp 98.1°F | Resp 16 | Ht 70.0 in | Wt 219.1 lb

## 2022-03-21 DIAGNOSIS — Z803 Family history of malignant neoplasm of breast: Secondary | ICD-10-CM | POA: Diagnosis not present

## 2022-03-21 DIAGNOSIS — Z170 Estrogen receptor positive status [ER+]: Secondary | ICD-10-CM | POA: Diagnosis not present

## 2022-03-21 DIAGNOSIS — Z923 Personal history of irradiation: Secondary | ICD-10-CM | POA: Insufficient documentation

## 2022-03-21 DIAGNOSIS — R011 Cardiac murmur, unspecified: Secondary | ICD-10-CM | POA: Insufficient documentation

## 2022-03-21 DIAGNOSIS — Z800 Family history of malignant neoplasm of digestive organs: Secondary | ICD-10-CM | POA: Diagnosis not present

## 2022-03-21 DIAGNOSIS — C50211 Malignant neoplasm of upper-inner quadrant of right female breast: Secondary | ICD-10-CM | POA: Insufficient documentation

## 2022-03-21 DIAGNOSIS — Z79810 Long term (current) use of selective estrogen receptor modulators (SERMs): Secondary | ICD-10-CM | POA: Diagnosis not present

## 2022-03-21 DIAGNOSIS — G629 Polyneuropathy, unspecified: Secondary | ICD-10-CM | POA: Insufficient documentation

## 2022-03-21 DIAGNOSIS — Z171 Estrogen receptor negative status [ER-]: Secondary | ICD-10-CM | POA: Diagnosis not present

## 2022-03-21 DIAGNOSIS — Z87891 Personal history of nicotine dependence: Secondary | ICD-10-CM | POA: Diagnosis not present

## 2022-03-21 DIAGNOSIS — Z9221 Personal history of antineoplastic chemotherapy: Secondary | ICD-10-CM | POA: Insufficient documentation

## 2022-03-21 NOTE — Progress Notes (Addendum)
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  She is tolerating tamoxifen remarkably well, with no significant hot flashes or vaginal wetness issues. Since last visit, she had a colonoscopy which showed a precancerous polyp and repeat colonoscopy was recommended in 5 years.  She is yet to have her annual dermatological exam.  She denies any new health issues.  She had a few questions about blood testing to detect cancer recurrence early.  Rest of the pertinent 10 point ROS reviewed and negative   COVID 19 VACCINATION STATUS: Status post [place] x2, status post infection June 2022  HISTORY OF CURRENT ILLNESS: From the original intake note:  Bryn herself palpated a mass in the upper-inner right breast in February 2021.  It appeared to increase in size over the next 3 weeks. Physical exam performed at [facility] 07/15/2019 confirmed a 6 mm firm, oval, palpable mass in the upper-inner right breast. She underwent bilateral diagnostic mammography with tomography and bilateral breast ultrasonography on 07/15/2019 showing: breast density category C; 4.6 cm mass in the right breast at 1 o'clock; single abnormal-appearing lymph node; bilateral benign cysts.  Accordingly on 07/21/2019 she  proceeded to biopsy of the right breast area in question. 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 Prognostic indicators significant for: estrogen receptor, 60% positive with weak staining intensity and progesterone receptor, 0% negative. Proliferation marker Ki67 at 90%. HER2 equivocal by immunohistochemistry (2+), but negative by fluorescent in situ hybridization with a signals ratio 1.24 and number per cell 2.55.  The questionable right axillary lymph node was biopsied as well and was benign (concordant).  The patient's subsequent history is as detailed below.   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[place])    Personal history of chemotherapy    completed 09-18-2019   Personal history of radiation therapy    completed 03-09-2020    PAST SURGICAL HISTORY: Past Surgical History:  Procedure Laterality Date   ABDOMINAL HYSTERECTOMY     BREAST LUMPECTOMY WITH RADIOACTIVE SEED AND SENTINEL LYMPH NODE BIOPSY Right 01/08/2020   Procedure: RIGHT BREAST LUMPECTOMY WITH RADIOACTIVE SEED AND SENTINEL LYMPH NODE BIOPSY;  Surgeon: [name], MD;  Location: [facility];  Service: General;  Laterality: Right;  LMA, PECTORAL BLOCK   COLONOSCOPY     LUNG BIOPSY     Per Care Everywhere: RUL lung biopsy 05/02/17 showed necrotizing granulomas   PORT-A-CATH REMOVAL Left 04/05/2020   Procedure: REMOVAL PORT-A-CATH;  Surgeon: [name], MD;  Location: [facility];  Service: General;  Laterality: Left;   PORTACATH PLACEMENT Left 08/06/2019   Procedure: INSERTION  PORT-A-CATH WITH ULTRASOUND GUIDANCE;  Surgeon: [name], MD;  Location: [facility];  Service: General;  Laterality: Left;   WISDOM TOOTH EXTRACTION    Status post bilateral salpingo-oophorectomy   FAMILY HISTORY: Family History  Problem Relation Age of Onset   Colon polyps Father    Colon polyps Sister    Skin cancer Sister        non-melanoma dx. in her 53s   Other Sister        normal genetic testing for hereditary cancer risks   Colon polyps Sister    Colon polyps Sister    Colon cancer Paternal Grandfather        dx. in his 83s   Wilm's tumor Son        dx. 9 months   Wilm's tumor Nephew 2   Breast cancer Other        dx. in her 36s (PGM's sister)   Breast cancer Other        dx. in her 72s (PGM's niece)   Esophageal cancer Neg Hx    Rectal cancer Neg Hx    Stomach cancer Neg Hx    Her father is 62 years old as of 07/2019. Her mother was murdered at age 16 (domestic violence).  The patient has 3 sisters and no brothers. She reports a third cousin (grandmother's sister's daughter) with breast cancer in her 23's. She denies a family history of ovarian, prostate, or pancreatic cancer. She does report colon cancer in her paternal grandfather in his 49's, non-melanoma skin cancer in her sister in her 64's, and Wilms tumor in her son at 12 months.   GYNECOLOGIC HISTORY:  Patient's last menstrual period was 05/09/2011. Menarche: [age] years old Age at first live birth: 61 years old [place] P 3 LMP 2013 Contraceptive never used HRT used for approximately 9 months  Hysterectomy? Yes, 2013 BSO? yes   SOCIAL HISTORY: (updated 07/2019)  [name] works as a Scientist, research (physical sciences). Husband [name] is an Art gallery manager. She lives at home with husband [name]. Daughter [name], age 62, is a poet and Pharmacist, hospital in [place], [place]. Son [name], age 62, is an Production designer, theatre/television/film in [place] in [place], [place]. Son Laneah Luft, age 62, is a Marketing executive working in  Artist in [place], [place] (at the [employer]). Molley has three grandchildren. She is a Media planner.    ADVANCED DIRECTIVES: In the absence of any documentation to the contrary, the patient's spouse is their HCPOA.    HEALTH MAINTENANCE: Social History   Tobacco Use   Smoking status: Former    Packs/day: 1.00    Years: 10.00    Total pack years: 10.00    Types: Cigarettes    Quit date: 1992    Years since quitting: 31.8   Smokeless tobacco: Never  Vaping Use   Vaping Use: Never used  Substance Use Topics   Alcohol use: Yes    Alcohol/week: 2.0 standard drinks of alcohol    Types: 2 Glasses of wine per week    Comment: occassionally   Drug use: Never     Colonoscopy: none on file  PAP: none on file (s/p hysterectomy)  Bone density: n/a (age)   Allergies  Allergen Reactions   Other Rash    Walnuts give her a rash  Walnuts give her a rash Walnuts give her a rash  Walnuts give her a rash Walnuts give her a rash Walnuts give her a rash Walnuts give her a rash    Current Outpatient Medications  Medication Sig Dispense Refill   b complex vitamins capsule Take 1 capsule by mouth daily.     Multiple Vitamin (MULTI-VITAMIN DAILY PO) Take 1 tablet by mouth daily.      tamoxifen (NOLVADEX) 20 MG tablet Take 1 tablet (20 mg total) by mouth daily. 90 tablet 4   No current facility-administered medications for this visit.   Facility-Administered Medications Ordered in Other Visits  Medication Dose Route Frequency Provider Last Rate Last Admin   heparin lock flush 100 unit/mL  500 Units Intracatheter Once PRN Magrinat, [name], MD       sodium chloride flush (NS) 0.9 % injection 10 mL  10 mL Intracatheter PRN Magrinat, [name], MD        OBJECTIVE: White woman who appears younger than stated age  80:   03/21/22 1538  BP: (!) 144/71  Pulse: 70  Resp: 16  Temp: 98.1 F (36.7 C)  SpO2: 100%     Body mass index is  31.44 kg/m.   Wt Readings from Last 3 Encounters:  03/21/22 219 lb 1.6 oz (99.4 kg)  12/02/21 213 lb (96.6 kg)  11/15/21 213 lb (96.6 kg)     ECOG FS:1 - Symptomatic but completely ambulatory  Physical Exam Constitutional:      Appearance: Normal appearance.  Cardiovascular:     Rate and Rhythm: Normal rate and regular rhythm.     Pulses: Normal pulses.     Heart sounds: Murmur (She has a faint heart murmur very subtle on exam.) heard.  Chest:     Comments: No palpable masses in both breasts.  Left breast nipple appears to be underdeveloped, this is chronic.  No palpable regional adenopathy Musculoskeletal:        General: No swelling or tenderness.     Cervical back: Normal range of motion and neck supple. No rigidity.  Lymphadenopathy:     Cervical: No cervical adenopathy.  Skin:    General: Skin is warm and dry.  Neurological:     Mental Status: She is alert.     LAB RESULTS:  CMP     Component Value Date/Time   NA 142 09/15/2021 1522   K 3.8 09/15/2021 1522   CL 109 09/15/2021 1522   CO2 27 09/15/2021 1522   GLUCOSE 109 (H) 09/15/2021 1522   BUN 22 09/15/2021 1522   CREATININE 0.82 09/15/2021 1522   CALCIUM 9.1 09/15/2021 1522   PROT 6.9 09/15/2021 1522   ALBUMIN 4.1 09/15/2021 1522   AST 17 09/15/2021 1522   ALT 13 09/15/2021 1522   ALKPHOS 55 09/15/2021 1522   BILITOT 0.3 09/15/2021 1522   GFRNONAA >60 09/15/2021 1522   GFRAA >60 01/07/2020 1055   GFRAA >60 07/30/2019 0825    No results found for: "TOTALPROTELP", "ALBUMINELP", "A1GS", "A2GS", "BETS", "BETA2SER", "GAMS", "MSPIKE", "SPEI"  Lab Results  Component Value Date   WBC 7.0 09/15/2021   NEUTROABS 4.7 09/15/2021   HGB 12.2 09/15/2021   HCT 36.6 09/15/2021   MCV 92.7 09/15/2021   PLT 224 09/15/2021    No results found for: "LABCA2"  No components found for: "EXHBZJ696"  No results for input(s): "INR" in the last 168 hours.  No results found for: "LABCA2"  No results found for:  "VEL381"  No results found for: "CAN125"  No results found for: "CAN153"  No results found for: "CA2729"  No components found for: "HGQUANT"  No results found for: "CEA1", "CEA" / No results found for: "CEA1", "CEA"   No results found for: "AFPTUMOR"  No results found for: "CHROMOGRNA"  No results found for: "KPAFRELGTCHN", "LAMBDASER", "KAPLAMBRATIO" (kappa/lambda light chains)  No results found for: "HGBA", "HGBA2QUANT", "HGBFQUANT", "HGBSQUAN" (Hemoglobinopathy evaluation)   No results found for: "LDH"  Lab Results  Component Value Date   IRON 56 03/15/2021   TIBC 295 03/15/2021   IRONPCTSAT 19 (L) 03/15/2021   (Iron and TIBC)  Lab Results  Component Value Date   FERRITIN 309 (H) 03/15/2021    Urinalysis    Component Value Date/Time   COLORURINE YELLOW 11/26/2019 1319   APPEARANCEUR CLEAR 11/26/2019 1319   LABSPEC 1.030 11/26/2019 1319   PHURINE 5.0 11/26/2019 1319   GLUCOSEU NEGATIVE 11/26/2019 1319   HGBUR NEGATIVE 11/26/2019 1319   BILIRUBINUR NEGATIVE 11/26/2019 1319   KETONESUR NEGATIVE 11/26/2019 1319   PROTEINUR NEGATIVE 11/26/2019 1319   NITRITE NEGATIVE 11/26/2019 1319   LEUKOCYTESUR TRACE (A) 11/26/2019 1319    STUDIES: MR BREAST BILATERAL W WO CONTRAST INC CAD  Result Date: 03/16/2022 CLINICAL DATA:  History of previous right malignant lumpectomy September 2021. EXAM: BILATERAL BREAST MRI WITH AND WITHOUT CONTRAST TECHNIQUE: Multiplanar, multisequence MR images of both breasts were obtained prior to and following the intravenous administration of 9.5 ml of Gadavist. Three-dimensional MR images were rendered by post-processing of the original MR data on an independent workstation. The three-dimensional MR images were interpreted, and findings are reported in the following complete MRI report for this study. Three dimensional images were evaluated at the independent interpreting workstation using the DynaCAD thin client. COMPARISON:  Previous  exam(s). FINDINGS: Breast composition: b. Scattered fibroglandular tissue. Background parenchymal enhancement: Mild Right breast: Post lumpectomy changes over the inner slightly upper breast in the middle third. No suspicious masses or abnormal enhancement. Left breast: No mass or abnormal enhancement. Minimal asymmetric background enhancement over the middle to anterior third of the outer lower left breast stable compared to 2021. No suspicious masses or abnormal enhancement. Lymph nodes: No abnormal appearing lymph nodes. Ancillary findings:  None. IMPRESSION: Stable breast MRI without evidence of malignancy. Post lumpectomy change upper inner right breast. RECOMMENDATION: Recommend continued annual high risk screening breast MRI as clinically indicated. Also recommend continued annual bilateral 3D screening mammography. The American Cancer Society recommends annual MRI and mammography in patients with an estimated lifetime risk of developing breast cancer greater than 20 - 25%, or who are known or suspected to be positive for the breast cancer gene. BI-RADS CATEGORY  2: Benign. Electronically Signed   By: Marin Olp M.D.   On: 03/16/2022 11:34   ELIGIBLE FOR AVAILABLE RESEARCH PROTOCOL: AET  ASSESSMENT: 61 y.o. 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), with an MIB-1 of 90%  (1) neoadjuvant chemotherapy consisting of cyclophosphamide and doxorubicin in dose dense fashion x4 started 08/07/2019, completed 09/18/2019, followed by paclitaxel and carboplatin weekly started 10/08/2019  (a) echo 08/05/2019 showed an ejection fraction in the 60-65% range  (b) paclitaxel/carboplatinum discontinued after 9 doses (12/03/2019) secondary to grade 1 peripheral neuropathy  (2) status post right lumpectomy and sentinel lymph node sampling 01/08/2020 showing  a complete pathologic response (ypT0 ypN0)  (a) a single right  axillary lymph node was removed  (3) adjuvant radiation 02/12/2020 through 03/09/2020  (4) genetics testing 11/06/2019 through the [facility] Multi-Cancer Panel + Wilms Tumor Panel found no deleterious mutations in AIP, ALK, APC, ATM, AXIN2,BAP1,  BARD1, BLM, BMPR1A, BRCA1, BRCA2, BRIP1, CASR, CDC73, CDH1, CDK4, CDKN1B, CDKN1C, CDKN2A (p14ARF), CDKN2A (p16INK4a), CEBPA, CHEK2, CTNNA1, DICER1, DIS3L2, EGFR (c.2369C>T, p.Thr790Met variant only), EPCAM (Deletion/duplication testing only), FH, FLCN, GATA2, GPC3, GREM1 (Promoter region deletion/duplication testing only), HOXB13 (c.251G>A, p.Gly84Glu), HRAS, KIT, MAX, MEN1, MET, MITF (c.952G>A, p.Glu318Lys variant only), MLH1, MSH2, MSH3, MSH6, MUTYH, NBN, NF1, NF2, NTHL1, PALB2, PDGFRA, PHOX2B, PMS2, POLD1, POLE, POT1, PRKAR1A, PTCH1, PTEN, RAD50, RAD51C, RAD51D, RB1, RECQL4, RET, RNF43, RUNX1, SDHAF2, SDHA (sequence changes only), SDHB, SDHC, SDHD, SMAD4, SMARCA4, SMARCB1, SMARCE1, STK11, SUFU, TERC, TERT, TMEM127, TP53, TSC1, TSC2, VHL, WRN and WT1. The Wilms Tumor Panel offered by [facility] includes sequencing and deletion/duplication testing of the following 7 genes: CDC73, CDKN1C, CTR9, DIS3L2, GPC3, REST, and WT1.  (a) Three variants of uncertain significance were detected - one in the AXIN2 gene called c.13A>G, one in the RNF43 gene called c.1770G>C, and one in the [facility] gene called c.85C>G.   (5) on prophylactic tamoxifen started March 2022  (a) s/p hysterectomy  (b) s/p HRT w/o complications   PLAN:  She is currently on tamoxifen and has been tolerating this very well.  She denies any new complaints. No concerns on PE exam except as mentioned above Most recent MRI without any concern for malignancy.  She will continue to follow-up with her other specialist at this time as recommended. She had a few questions about molecular testing, blood testing to detect cancer recurrence early hence have  given her information on [employer].  She would like to research about it and will keep [place] posted.  She wants to continue mammograms at this time but is not quite sure about doing MRIs annually.  She wants to think about it. She can continue annual labs. With regards to the faint heart murmur, I have encouraged her to talk to her PCP about this since she is clinically asymptomatic. Total time spent: 30 minutes  *Total Encounter Time as defined by the Centers for Medicare and Medicaid Services in  She wants to continue mammograms at this time but is not quite sure about doing MRIs annually.  She wants to think about it.Most recent MRI without any concern for malignancy.cludes, in addition to the face-to-face time of a patient visit (documented in the note above) non-face-to-face time: obtaining and reviewing outside history, ordering and reviewing medications, tests or procedures, care coordination (communications with other health care professionals or caregivers) and documentation in the medical record.

## 2022-03-21 NOTE — Addendum Note (Signed)
Addended by: Adaline Sill on: 03/21/2022 04:30 PM   Modules accepted: Level of Service

## 2022-05-23 ENCOUNTER — Telehealth: Payer: Self-pay

## 2022-05-23 NOTE — Telephone Encounter (Signed)
Pt called to ask where she was sent for dermatology. Pt was scheduled with [facility] for 05/24/22 at 145, but it appears their office is now closed. Attempted to return pt's call to give her this information.

## 2022-05-24 ENCOUNTER — Ambulatory Visit: Payer: BC Managed Care – PPO | Admitting: Physician Assistant

## 2022-05-25 ENCOUNTER — Telehealth: Payer: Self-pay

## 2022-05-25 DIAGNOSIS — C50211 Malignant neoplasm of upper-inner quadrant of right female breast: Secondary | ICD-10-CM

## 2022-05-25 NOTE — Telephone Encounter (Signed)
Returned Pts call regarding derm referral. [place] who verbalized next appt for new Pts is in Feb. Pt agreeable and verbalized understanding. Referral faxed to [phone] with receipt confirmation.   [facility] again to verify that they will call Pt to schedule, they stated that the previous staff member was mistaken and that they are not taking new pts.  After [place] dermat[place], Dr. [facility] stated that they are accepting new pts and soonest appts are in Feb. Referral faxed to [phone] with receipt confirmation.  Called Pt and LVM with above information.

## 2022-06-08 ENCOUNTER — Other Ambulatory Visit: Payer: Self-pay | Admitting: Pain Medicine

## 2022-06-08 DIAGNOSIS — Z780 Asymptomatic menopausal state: Secondary | ICD-10-CM

## 2022-07-03 ENCOUNTER — Telehealth: Payer: Self-pay | Admitting: Hematology and Oncology

## 2022-07-03 NOTE — Telephone Encounter (Signed)
Reached out to patient to reschedule per [place] out of office; left voicemail.

## 2022-07-03 NOTE — Telephone Encounter (Signed)
Reached out to reschedule patient, Debra [name] out of office on the original date; left message for patient to call back.

## 2022-07-07 ENCOUNTER — Encounter: Payer: Self-pay | Admitting: Hematology and Oncology

## 2022-07-14 ENCOUNTER — Telehealth: Payer: Self-pay | Admitting: Family Medicine

## 2022-07-14 NOTE — Telephone Encounter (Signed)
Per 3/8 IB reached out to patient to answer questions regarding upcoming appointments, left voicemail of time and dates.

## 2022-07-17 ENCOUNTER — Encounter: Payer: Self-pay | Admitting: *Deleted

## 2022-07-18 ENCOUNTER — Other Ambulatory Visit: Payer: Self-pay | Admitting: *Deleted

## 2022-07-18 DIAGNOSIS — Z170 Estrogen receptor positive status [ER+]: Secondary | ICD-10-CM

## 2022-07-18 DIAGNOSIS — Z808 Family history of malignant neoplasm of other organs or systems: Secondary | ICD-10-CM

## 2022-07-18 DIAGNOSIS — Z800 Family history of malignant neoplasm of digestive organs: Secondary | ICD-10-CM

## 2022-07-18 DIAGNOSIS — Z803 Family history of malignant neoplasm of breast: Secondary | ICD-10-CM

## 2022-07-18 DIAGNOSIS — C50211 Malignant neoplasm of upper-inner quadrant of right female breast: Secondary | ICD-10-CM

## 2022-09-19 ENCOUNTER — Other Ambulatory Visit: Payer: Self-pay | Admitting: Hematology and Oncology

## 2022-09-19 ENCOUNTER — Ambulatory Visit
Admission: RE | Admit: 2022-09-19 | Discharge: 2022-09-19 | Disposition: A | Discharge status: Home / Self Care | Payer: BC Managed Care – PPO | Source: Ambulatory Visit | Attending: Hematology and Oncology | Admitting: Hematology and Oncology

## 2022-09-19 DIAGNOSIS — Z808 Family history of malignant neoplasm of other organs or systems: Secondary | ICD-10-CM

## 2022-09-19 DIAGNOSIS — Z170 Estrogen receptor positive status [ER+]: Secondary | ICD-10-CM

## 2022-09-19 DIAGNOSIS — Z800 Family history of malignant neoplasm of digestive organs: Secondary | ICD-10-CM

## 2022-09-19 DIAGNOSIS — Z803 Family history of malignant neoplasm of breast: Secondary | ICD-10-CM

## 2022-09-20 ENCOUNTER — Ambulatory Visit: Payer: BC Managed Care – PPO | Admitting: Hematology and Oncology

## 2022-09-20 ENCOUNTER — Other Ambulatory Visit: Payer: BC Managed Care – PPO

## 2022-09-26 ENCOUNTER — Other Ambulatory Visit: Payer: Self-pay | Admitting: *Deleted

## 2022-09-26 DIAGNOSIS — C50211 Malignant neoplasm of upper-inner quadrant of right female breast: Secondary | ICD-10-CM

## 2022-09-27 ENCOUNTER — Inpatient Hospital Stay: Payer: BC Managed Care – PPO | Attending: Hematology and Oncology

## 2022-09-27 ENCOUNTER — Inpatient Hospital Stay: Payer: BC Managed Care – PPO | Admitting: Hematology and Oncology

## 2022-09-27 ENCOUNTER — Other Ambulatory Visit: Payer: Self-pay

## 2022-09-27 VITALS — BP 138/66 | HR 71 | Temp 98.1°F | Resp 18 | Wt 210.4 lb

## 2022-09-27 DIAGNOSIS — Z87891 Personal history of nicotine dependence: Secondary | ICD-10-CM | POA: Diagnosis not present

## 2022-09-27 DIAGNOSIS — Z923 Personal history of irradiation: Secondary | ICD-10-CM | POA: Insufficient documentation

## 2022-09-27 DIAGNOSIS — Z79810 Long term (current) use of selective estrogen receptor modulators (SERMs): Secondary | ICD-10-CM | POA: Diagnosis not present

## 2022-09-27 DIAGNOSIS — Z170 Estrogen receptor positive status [ER+]: Secondary | ICD-10-CM

## 2022-09-27 DIAGNOSIS — Z800 Family history of malignant neoplasm of digestive organs: Secondary | ICD-10-CM | POA: Insufficient documentation

## 2022-09-27 DIAGNOSIS — Z803 Family history of malignant neoplasm of breast: Secondary | ICD-10-CM | POA: Insufficient documentation

## 2022-09-27 DIAGNOSIS — Z9221 Personal history of antineoplastic chemotherapy: Secondary | ICD-10-CM | POA: Insufficient documentation

## 2022-09-27 DIAGNOSIS — Z171 Estrogen receptor negative status [ER-]: Secondary | ICD-10-CM | POA: Insufficient documentation

## 2022-09-27 DIAGNOSIS — C50211 Malignant neoplasm of upper-inner quadrant of right female breast: Secondary | ICD-10-CM

## 2022-09-27 LAB — CBC WITH DIFFERENTIAL (CANCER CENTER ONLY)
Abs Immature Granulocytes: 0.02 10*3/uL (ref 0.00–0.07)
Basophils Absolute: 0.1 10*3/uL (ref 0.0–0.1)
Basophils Relative: 1 %
Eosinophils Absolute: 0.1 10*3/uL (ref 0.0–0.5)
Eosinophils Relative: 2 %
HCT: 35.4 % — ABNORMAL LOW (ref 36.0–46.0)
Hemoglobin: 11.7 g/dL — ABNORMAL LOW (ref 12.0–15.0)
Immature Granulocytes: 0 %
Lymphocytes Relative: 25 %
Lymphs Abs: 1.8 10*3/uL (ref 0.7–4.0)
MCH: 30.4 pg (ref 26.0–34.0)
MCHC: 33.1 g/dL (ref 30.0–36.0)
MCV: 91.9 fL (ref 80.0–100.0)
Monocytes Absolute: 0.5 10*3/uL (ref 0.1–1.0)
Monocytes Relative: 7 %
Neutro Abs: 4.7 10*3/uL (ref 1.7–7.7)
Neutrophils Relative %: 65 %
Platelet Count: 202 10*3/uL (ref 150–400)
RBC: 3.85 MIL/uL — ABNORMAL LOW (ref 3.87–5.11)
RDW: 12.9 % (ref 11.5–15.5)
WBC Count: 7.3 10*3/uL (ref 4.0–10.5)
nRBC: 0 % (ref 0.0–0.2)

## 2022-09-27 LAB — CMP (CANCER CENTER ONLY)
ALT: 13 U/L (ref 0–44)
AST: 18 U/L (ref 15–41)
Albumin: 4.1 g/dL (ref 3.5–5.0)
Alkaline Phosphatase: 50 U/L (ref 38–126)
Anion gap: 5 (ref 5–15)
BUN: 15 mg/dL (ref 8–23)
CO2: 27 mmol/L (ref 22–32)
Calcium: 8.9 mg/dL (ref 8.9–10.3)
Chloride: 108 mmol/L (ref 98–111)
Creatinine: 0.75 mg/dL (ref 0.44–1.00)
GFR, Estimated: >60 mL/min (ref >60)
Glucose, Bld: 91 mg/dL (ref 70–99)
Potassium: 3.9 mmol/L (ref 3.5–5.1)
Sodium: 140 mmol/L (ref 135–145)
Total Bilirubin: 0.4 mg/dL (ref 0.3–1.2)
Total Protein: 6.4 g/dL — ABNORMAL LOW (ref 6.5–8.1)

## 2022-09-27 NOTE — Progress Notes (Signed)
[facility]  Telephone:(336) [phone] Fax:(336) [phone]     ID: Debra [name] DOB: July 15, 1960  MR#: [id number]  ZHY#:[id number]  Patient Care Team: Ileana Ladd, MD (Inactive) as PCP - General (Family Medicine) Pershing Proud, RN as Oncology Nurse Navigator Donnelly Angelica, RN as Oncology Nurse Navigator Abigail Miyamoto, MD as Consulting Physician (General Surgery) Dorothy Puffer, MD as Consulting Physician (Radiation Oncology) Rachel Moulds, MD as Consulting Physician (Hematology and Oncology) Rachel Moulds, MD OTHER MD:  CHIEF COMPLAINT: 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  She is tolerating tamoxifen remarkably well, with no significant hot flashes or vaginal wetness issues. She is very worried about some crusting and itching of the left nipple which she wanted to be examined for.  She recently had a mammogram which did not show any evidence of malignancy.  She denies any change in breathing or asymmetrical lower extremity swelling.  No nipple discharge noted.  Rest of the pertinent 10 point ROS reviewed and negative   COVID 19 VACCINATION STATUS: Status post Pfizer x2, status post infection June 2022  HISTORY OF CURRENT ILLNESS: From the original intake note:  [name] herself palpated a mass in the upper-inner right breast in February 2021.  It appeared to increase in size over the next 3 weeks. Physical exam performed at [facility] 07/15/2019 confirmed a 6 mm firm, oval, palpable mass in the upper-inner right breast. She underwent bilateral diagnostic mammography with tomography and bilateral breast ultrasonography on 07/15/2019 showing: breast density category C; 4.6 cm mass in the right breast at 1 o'clock; single abnormal-appearing lymph node; bilateral benign cysts.  Accordingly on 07/21/2019 she  proceeded to biopsy of the right breast area in question. 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 Prognostic indicators significant for: estrogen receptor, 60% positive with weak staining intensity and progesterone receptor, 0% negative. Proliferation marker Ki67 at 90%. HER2 equivocal by immunohistochemistry (2+), but negative by fluorescent in situ hybridization with a signals ratio 1.24 and number per cell 2.55.  The questionable right axillary lymph node was biopsied as well and was benign (concordant).  The patient's subsequent history is as detailed below.   PAST MEDICAL HISTORY: Past Medical History:  Diagnosis Date   Blood transfusion without reported diagnosis    Cancer (HCC)    right breast   Family history of adverse reaction to anesthesia    Post-op N/V   Family history of breast cancer    Family history of colon cancer    Family history of nonmelanoma skin cancer    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HCC)    Personal history of chemotherapy    completed 09-18-2019   Personal history of radiation therapy    completed 03-09-2020    PAST SURGICAL HISTORY: Past Surgical History:  Procedure Laterality Date   ABDOMINAL HYSTERECTOMY     BREAST LUMPECTOMY WITH RADIOACTIVE SEED AND SENTINEL LYMPH NODE BIOPSY Right 01/08/2020   Procedure: RIGHT BREAST LUMPECTOMY WITH RADIOACTIVE SEED AND SENTINEL LYMPH NODE BIOPSY;  Surgeon: Abigail Miyamoto, MD;  Location: [facility] OR;  Service: General;  Laterality: Right;  LMA, PECTORAL BLOCK   COLONOSCOPY     LUNG BIOPSY     Per Care Everywhere: RUL lung biopsy 05/02/17 showed necrotizing granulomas   PORT-A-CATH REMOVAL Left 04/05/2020   Procedure: REMOVAL PORT-A-CATH;  Surgeon: Abigail Miyamoto, MD;  Location: [facility];  Service: General;  Laterality: Left;   PORTACATH PLACEMENT Left 08/06/2019   Procedure: INSERTION  PORT-A-CATH WITH ULTRASOUND GUIDANCE;  Surgeon: Abigail Miyamoto, MD;  Location: [facility];  Service: General;  Laterality: Left;   WISDOM TOOTH EXTRACTION    Status post bilateral salpingo-oophorectomy   FAMILY HISTORY: Family History  Problem Relation Age of Onset   Colon polyps Father    Colon polyps Sister    Skin cancer Sister        non-melanoma dx. in her 34s   Other Sister        normal genetic testing for hereditary cancer risks   Colon polyps Sister    Colon polyps Sister    Colon cancer Paternal Grandfather        dx. in his 79s   Wilm's tumor Son        dx. 9 months   Wilm's tumor Nephew 2   Breast cancer Other        dx. in her 81s (PGM's sister)   Breast cancer Other        dx. in her 36s (PGM's niece)   Esophageal cancer Neg Hx    Rectal cancer Neg Hx    Stomach cancer Neg Hx    Her father is 20 years old as of 07/2019. Her mother was murdered at age 58 (domestic violence).  The patient has 3 sisters and no brothers. She reports a third cousin (grandmother's sister's daughter) with breast cancer in her 62's. She denies a family history of ovarian, prostate, or pancreatic cancer. She does report colon cancer in her paternal grandfather in his 46's, non-melanoma skin cancer in her sister in her 39's, and Wilms tumor in her son at 68 months.   GYNECOLOGIC HISTORY:  Patient's last menstrual period was 05/09/2011. Menarche: 73-47 years old Age at first live birth: 62 years old GX P 3 LMP 2013 Contraceptive never used HRT used for approximately 9 months  Hysterectomy? Yes, 2013 BSO? 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 Husband Candiss Norse is an Acupuncturist. She lives at home with husband Debra [name] Daughter Debra [name], age 45, is a poet and Runner, broadcasting/film/video in [place], [place]. Son Debra [name], age 20, is an Leisure centre manager in [place] in [place], [place]. Son Debra [name], age 21, is a Public affairs consultant working in  Medical laboratory scientific officer in [place], [place] (at the [employer]). Debra [name] has three grandchildren. She is a Statistician.    ADVANCED DIRECTIVES: In the absence of any documentation to the contrary, the patient's spouse is their HCPOA.    HEALTH MAINTENANCE: Social History   Tobacco Use   Smoking status: Former    Packs/day: 1.00    Years: 10.00    Additional pack years: 0.00    Total pack years: 10.00    Types: Cigarettes    Quit date: 1992    Years since quitting: 32.4   Smokeless tobacco: Never  Vaping Use   Vaping Use: Never used  Substance Use Topics   Alcohol use: Yes    Alcohol/week: 2.0 standard drinks of alcohol    Types: 2 Glasses of wine per week    Comment: occassionally   Drug use: Never     Colonoscopy: none on file  PAP: none on file (s/p hysterectomy)  Bone density: n/a (age)   Allergies  Allergen Reactions   Other Rash  Walnuts give her a rash    Walnuts give her a rash Walnuts give her a rash  Walnuts give her a rash Walnuts give her a rash Walnuts give her a rash Walnuts give her a rash    Current Outpatient Medications  Medication Sig Dispense Refill   b complex vitamins capsule Take 1 capsule by mouth daily.     Multiple Vitamin (MULTI-VITAMIN DAILY PO) Take 1 tablet by mouth daily.      tamoxifen (NOLVADEX) 20 MG tablet Take 1 tablet (20 mg total) by mouth daily. 90 tablet 4   No current facility-administered medications for this visit.   Facility-Administered Medications Ordered in Other Visits  Medication Dose Route Frequency Provider Last Rate Last Admin   heparin lock flush 100 unit/mL  500 Units Intracatheter Once PRN Magrinat, Valentino Hue, MD       sodium chloride flush (NS) 0.9 % injection 10 mL  10 mL Intracatheter PRN Magrinat, Valentino Hue, MD        OBJECTIVE: White woman who appears younger than stated age  Vitals:   09/27/22 1559  BP: 138/66  Pulse: 71  Resp: 18  Temp: 98.1 F (36.7 C)  SpO2: 99%      Body mass index is 30.19 kg/m.   Wt Readings from Last 3 Encounters:  09/27/22 210 lb 7 oz (95.5 kg)  03/21/22 219 lb 1.6 oz (99.4 kg)  12/02/21 213 lb (96.6 kg)     ECOG FS:1 - Symptomatic but completely ambulatory  Physical Exam Constitutional:      Appearance: Normal appearance.  Cardiovascular:     Rate and Rhythm: Normal rate and regular rhythm.     Pulses: Normal pulses.     Heart sounds: Murmur (She has a faint heart murmur very subtle on exam.) heard.  Chest:     Comments: No palpable masses in both breasts.  Left breast nipple appears to be underdeveloped, this is chronic.  There is a small area of crusting, please see picture in media.  There is no palpable mass or discharge.  No regional adenopathy Musculoskeletal:        General: No swelling or tenderness.     Cervical back: Normal range of motion and neck supple. No rigidity.  Lymphadenopathy:     Cervical: No cervical adenopathy.  Skin:    General: Skin is warm and dry.  Neurological:     Mental Status: She is alert.     LAB RESULTS:  CMP     Component Value Date/Time   NA 140 09/27/2022 1530   K 3.9 09/27/2022 1530   CL 108 09/27/2022 1530   CO2 27 09/27/2022 1530   GLUCOSE 91 09/27/2022 1530   BUN 15 09/27/2022 1530   CREATININE 0.75 09/27/2022 1530   CALCIUM 8.9 09/27/2022 1530   PROT 6.4 (L) 09/27/2022 1530   ALBUMIN 4.1 09/27/2022 1530   AST 18 09/27/2022 1530   ALT 13 09/27/2022 1530   ALKPHOS 50 09/27/2022 1530   BILITOT 0.4 09/27/2022 1530   GFRNONAA >60 09/27/2022 1530   GFRAA >60 01/07/2020 1055   GFRAA >60 07/30/2019 0825    No results found for: "TOTALPROTELP", "ALBUMINELP", "A1GS", "A2GS", "BETS", "BETA2SER", "GAMS", "MSPIKE", "SPEI"  Lab Results  Component Value Date   WBC 7.3 09/27/2022   NEUTROABS 4.7 09/27/2022   HGB 11.7 (L) 09/27/2022   HCT 35.4 (L) 09/27/2022   MCV 91.9 09/27/2022   PLT 202 09/27/2022    No results found for: "LABCA2"  No components found for:  "BJYNWG956"  No results for input(s): "INR" in the last 168 hours.  No results found for: "LABCA2"  No results found for: "OZH086"  No results found for: "CAN125"  No results found for: "CAN153"  No results found for: "CA2729"  No components found for: "HGQUANT"  No results found for: "CEA1", "CEA" / No results found for: "CEA1", "CEA"   No results found for: "AFPTUMOR"  No results found for: "CHROMOGRNA"  No results found for: "KPAFRELGTCHN", "LAMBDASER", "KAPLAMBRATIO" (kappa/lambda light chains)  No results found for: "HGBA", "HGBA2QUANT", "HGBFQUANT", "HGBSQUAN" (Hemoglobinopathy evaluation)   No results found for: "LDH"  Lab Results  Component Value Date   IRON 56 03/15/2021   TIBC 295 03/15/2021   IRONPCTSAT 19 (L) 03/15/2021   (Iron and TIBC)  Lab Results  Component Value Date   FERRITIN 309 (H) 03/15/2021    Urinalysis    Component Value Date/Time   COLORURINE YELLOW 11/26/2019 1319   APPEARANCEUR CLEAR 11/26/2019 1319   LABSPEC 1.030 11/26/2019 1319   PHURINE 5.0 11/26/2019 1319   GLUCOSEU NEGATIVE 11/26/2019 1319   HGBUR NEGATIVE 11/26/2019 1319   BILIRUBINUR NEGATIVE 11/26/2019 1319   KETONESUR NEGATIVE 11/26/2019 1319   PROTEINUR NEGATIVE 11/26/2019 1319   NITRITE NEGATIVE 11/26/2019 1319   LEUKOCYTESUR TRACE (A) 11/26/2019 1319    STUDIES: MM 3D DIAGNOSTIC MAMMOGRAM BILATERAL BREAST  Result Date: 09/19/2022 CLINICAL DATA:  Status post malignant right lumpectomy in 2021. EXAM: DIGITAL DIAGNOSTIC BILATERAL MAMMOGRAM WITH TOMOSYNTHESIS TECHNIQUE: Bilateral digital diagnostic mammography and breast tomosynthesis was performed. COMPARISON:  Previous exam(s). ACR Breast Density Category b: There are scattered areas of fibroglandular density. FINDINGS: Stable post lumpectomy changes are demonstrated in central right breast. No new or suspicious mammographic findings are identified either breast. The parenchymal pattern is unchanged. IMPRESSION: 1.  No mammographic evidence of malignancy in either breast. 2. Stable right breast posttreatment changes. RECOMMENDATION: Per protocol, as the patient is now 2 or more years status post lumpectomy, she may return to annual screening mammography in 1 year. However, given the history of breast cancer, the patient remains eligible for annual diagnostic mammography if preferred. I have discussed the findings and recommendations with the patient. If applicable, a reminder letter will be sent to the patient regarding the next appointment. BI-RADS CATEGORY  2: Benign. Electronically Signed   By: Sande Brothers M.D.   On: 09/19/2022 16:12   ELIGIBLE FOR AVAILABLE RESEARCH PROTOCOL: AET  ASSESSMENT: 62 y.o. 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), with an MIB-1 of 90%  (1) neoadjuvant chemotherapy consisting of cyclophosphamide and doxorubicin in dose dense fashion x4 started 08/07/2019, completed 09/18/2019, followed by paclitaxel and carboplatin weekly started 10/08/2019  (a) echo 08/05/2019 showed an ejection fraction in the 60-65% range  (b) paclitaxel/carboplatinum discontinued after 9 doses (12/03/2019) secondary to grade 1 peripheral neuropathy  (2) status post right lumpectomy and sentinel lymph node sampling 01/08/2020 showing a complete pathologic response (ypT0 ypN0)  (a) a single right axillary lymph node was removed  (3) adjuvant radiation 02/12/2020 through 03/09/2020  (4) genetics testing 11/06/2019 through the [facility] Multi-Cancer Panel + Wilms Tumor Panel found no deleterious mutations in AIP, ALK, APC, ATM, AXIN2,BAP1,  BARD1, BLM, BMPR1A, BRCA1, BRCA2, BRIP1, CASR, CDC73, CDH1, CDK4, CDKN1B, CDKN1C, CDKN2A (p14ARF), CDKN2A (p16INK4a), CEBPA, CHEK2, CTNNA1, DICER1, DIS3L2, EGFR (c.2369C>T, p.Thr790Met variant only), EPCAM  (Deletion/duplication  testing only), FH, FLCN, GATA2, GPC3, GREM1 (Promoter region deletion/duplication testing only), HOXB13 (c.251G>A, p.Gly84Glu), HRAS, KIT, MAX, MEN1, MET, MITF (c.952G>A, p.Glu318Lys variant only), MLH1, MSH2, MSH3, MSH6, MUTYH, NBN, NF1, NF2, NTHL1, PALB2, PDGFRA, PHOX2B, PMS2, POLD1, POLE, POT1, PRKAR1A, PTCH1, PTEN, RAD50, RAD51C, RAD51D, RB1, RECQL4, RET, RNF43, RUNX1, SDHAF2, SDHA (sequence changes only), SDHB, SDHC, SDHD, SMAD4, SMARCA4, SMARCB1, SMARCE1, STK11, SUFU, TERC, TERT, TMEM127, TP53, TSC1, TSC2, VHL, WRN and WT1. The Wilms Tumor Panel offered by [facility] includes sequencing and deletion/duplication testing of the following 7 genes: CDC73, CDKN1C, CTR9, DIS3L2, GPC3, REST, and WT1.  (a) Three variants of uncertain significance were detected - one in the AXIN2 gene called c.13A>G, one in the RNF43 gene called c.1770G>C, and one in the [facility] gene called c.85C>G.   (5) on prophylactic tamoxifen started March 2022  (a) s/p hysterectomy  (b) s/p HRT w/o complications   PLAN:  She is currently on tamoxifen and has been tolerating this very well.  She denies any new complaints. No concerns on PE exam except for small area of crusting in the left nipple. Most recent mammogram with no concern for malignancy.  At this time she is not interested in pursuing [employer] testing or MRIs for screening.  We have discussed that the area of nipple dryness appears to be benign but I have recommended her to come back and see [place] in about a month.  I have taken a picture and scanned into the media.  She is agreeable to follow-up in a month and if needed we will proceed with punch biopsy at that time. No adverse effects with tamoxifen at this time.  Total time: 30 min  *Total Encounter Time as defined by the Centers for Medicare and Medicaid Services in  She wants to continue mammograms at this time but is not quite sure about doing MRIs annually.  She wants to think about it.Most recent  MRI without any concern for malignancy.cludes, in addition to the face-to-face time of a patient visit (documented in the note above) non-face-to-face time: obtaining and reviewing outside history, ordering and reviewing medications, tests or procedures, care coordination (communications with other health care professionals or caregivers) and documentation in the medical record.

## 2022-09-27 NOTE — Progress Notes (Signed)
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  Left nipple flaky and sensitive for the past 4 weeks.   COVID 19 VACCINATION STATUS: Status post Pfizer x2, status post infection June 2022  HISTORY OF CURRENT ILLNESS: From the original intake note:  Ellisen herself palpated a mass in the upper-inner right breast in February 2021.  It appeared to increase in size over the next 3 weeks. Physical exam performed at [facility] 07/15/2019 confirmed a 6 mm firm, oval, palpable mass in the upper-inner right breast. She underwent bilateral diagnostic mammography with tomography and bilateral breast ultrasonography on 07/15/2019 showing: breast density category C; 4.6 cm mass in the right breast at 1 o'clock; single abnormal-appearing lymph node; bilateral benign cysts.  Accordingly on 07/21/2019 she proceeded to biopsy of the right breast area in question. 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 Prognostic indicators significant for: estrogen receptor, 60% positive with weak staining intensity and progesterone receptor, 0% negative. Proliferation marker Ki67 at 90%. HER2 equivocal by  immunohistochemistry (2+), but negative by fluorescent in situ hybridization with a signals ratio 1.24 and number per cell 2.55.  The questionable right axillary lymph node was biopsied as well and was benign (concordant).  The patient's subsequent history is as detailed below.   PAST MEDICAL HISTORY: Past Medical History:  Diagnosis Date   Blood transfusion without reported diagnosis    Cancer (HCC)    right breast   Family history of adverse reaction to anesthesia    Post-op N/V   Family history of breast cancer    Family history of colon cancer    Family history of nonmelanoma skin cancer    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HCC)    Personal history of chemotherapy    completed 09-18-2019   Personal history of radiation therapy    completed 03-09-2020    PAST SURGICAL HISTORY: Past Surgical History:  Procedure Laterality Date   ABDOMINAL HYSTERECTOMY     BREAST LUMPECTOMY WITH RADIOACTIVE SEED AND SENTINEL LYMPH NODE BIOPSY Right 01/08/2020   Procedure: RIGHT BREAST LUMPECTOMY WITH RADIOACTIVE SEED AND SENTINEL LYMPH NODE BIOPSY;  Surgeon: Abigail Miyamoto, MD;  Location: [facility] OR;  Service: General;  Laterality: Right;  LMA, PECTORAL BLOCK   COLONOSCOPY     LUNG BIOPSY     Per Care Everywhere: RUL lung biopsy 05/02/17 showed necrotizing granulomas   PORT-A-CATH REMOVAL Left 04/05/2020   Procedure: REMOVAL PORT-A-CATH;  Surgeon: Abigail Miyamoto, MD;  Location: [facility];  Service: General;  Laterality: Left;   PORTACATH PLACEMENT Left 08/06/2019   Procedure: INSERTION PORT-A-CATH WITH ULTRASOUND GUIDANCE;  Surgeon: Abigail Miyamoto, MD;  Location: [facility];  Service: General;  Laterality: Left;   WISDOM TOOTH EXTRACTION    Status post bilateral salpingo-oophorectomy   FAMILY HISTORY: Family History  Problem  Relation Age of Onset   Colon polyps Father    Colon polyps Sister    Skin cancer Sister        non-melanoma dx. in  her 50s   Other Sister        normal genetic testing for hereditary cancer risks   Colon polyps Sister    Colon polyps Sister    Colon cancer Paternal Grandfather        dx. in his 66s   Wilm's tumor Son        dx. 9 months   Wilm's tumor Nephew 2   Breast cancer Other        dx. in her 43s (PGM's sister)   Breast cancer Other        dx. in her 33s (PGM's niece)   Esophageal cancer Neg Hx    Rectal cancer Neg Hx    Stomach cancer Neg Hx    Her father is 84 years old as of 07/2019. Her mother was murdered at age 79 (domestic violence).  The patient has 3 sisters and no brothers. She reports a third cousin (grandmother's sister's daughter) with breast cancer in her 6's. She denies a family history of ovarian, prostate, or pancreatic cancer. She does report colon cancer in her paternal grandfather in his 59's, non-melanoma skin cancer in her sister in her 19's, and Wilms tumor in her son at 5 months.   GYNECOLOGIC HISTORY:  Patient's last menstrual period was 05/09/2011. Menarche: 33-49 years old Age at first live birth: 62 years old GX P 3 LMP 2013 Contraceptive never used HRT used for approximately 9 months  Hysterectomy? Yes, 2013 BSO? 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 Husband Candiss Norse is an Acupuncturist. She lives at home with husband Kennedy Bucker. Daughter Mort Sawyers, age 87, is a poet and Runner, broadcasting/film/video in [place], [place]. Son Launa Flight, age 49, is an Leisure centre manager in [place] in [place], [place]. Son Nilah Papalia, age 58, is a Public affairs consultant working in Medical laboratory scientific officer in [place], [place] (at the [employer]). Jaina has three grandchildren. She is a Statistician.    ADVANCED DIRECTIVES: In the absence of any documentation to the contrary, the patient's spouse is their HCPOA.    HEALTH MAINTENANCE: Social History   Tobacco Use   Smoking status: Former    Packs/day: 1.00    Years: 10.00     Additional pack years: 0.00    Total pack years: 10.00    Types: Cigarettes    Quit date: 1992    Years since quitting: 32.4   Smokeless tobacco: Never  Vaping Use   Vaping Use: Never used  Substance Use Topics   Alcohol use: Yes    Alcohol/week: 2.0 standard drinks of alcohol    Types: 2 Glasses of wine per week    Comment: occassionally   Drug use: Never     Colonoscopy: none on file  PAP: none on file (s/p hysterectomy)  Bone density: n/a (age)   Allergies  Allergen Reactions   Other Rash    Walnuts give her a rash    Walnuts give her a rash Walnuts give her a rash  Walnuts give her a rash Walnuts give her a rash Walnuts give her a rash Walnuts give her a rash    Current Outpatient Medications  Medication Sig Dispense Refill   b complex vitamins capsule Take 1 capsule by mouth daily.  Multiple Vitamin (MULTI-VITAMIN DAILY PO) Take 1 tablet by mouth daily.      tamoxifen (NOLVADEX) 20 MG tablet Take 1 tablet (20 mg total) by mouth daily. 90 tablet 4   No current facility-administered medications for this visit.   Facility-Administered Medications Ordered in Other Visits  Medication Dose Route Frequency Provider Last Rate Last Admin   heparin lock flush 100 unit/mL  500 Units Intracatheter Once PRN Magrinat, Valentino Hue, MD       sodium chloride flush (NS) 0.9 % injection 10 mL  10 mL Intracatheter PRN Magrinat, Valentino Hue, MD        OBJECTIVE: White woman who appears younger than stated age  Vitals:   09/27/22 1559  BP: 138/66  Pulse: 71  Resp: 18  Temp: 98.1 F (36.7 C)  SpO2: 99%     Body mass index is 30.19 kg/m.   Wt Readings from Last 3 Encounters:  09/27/22 210 lb 7 oz (95.5 kg)  03/21/22 219 lb 1.6 oz (99.4 kg)  12/02/21 213 lb (96.6 kg)     ECOG FS:1 - Symptomatic but completely ambulatory  Physical Exam Constitutional:      Appearance: Normal appearance.  Cardiovascular:     Rate and Rhythm: Normal rate and regular rhythm.     Pulses:  Normal pulses.     Heart sounds: Murmur (She has a faint heart murmur very subtle on exam.) heard.  Chest:     Comments: No palpable masses in both breasts.  Left breast nipple appears to be underdeveloped, this is chronic.  No palpable regional adenopathy Musculoskeletal:        General: No swelling or tenderness.     Cervical back: Normal range of motion and neck supple. No rigidity.  Lymphadenopathy:     Cervical: No cervical adenopathy.  Skin:    General: Skin is warm and dry.  Neurological:     Mental Status: She is alert.     LAB RESULTS:  CMP     Component Value Date/Time   NA 140 09/27/2022 1530   K 3.9 09/27/2022 1530   CL 108 09/27/2022 1530   CO2 27 09/27/2022 1530   GLUCOSE 91 09/27/2022 1530   BUN 15 09/27/2022 1530   CREATININE 0.75 09/27/2022 1530   CALCIUM 8.9 09/27/2022 1530   PROT 6.4 (L) 09/27/2022 1530   ALBUMIN 4.1 09/27/2022 1530   AST 18 09/27/2022 1530   ALT 13 09/27/2022 1530   ALKPHOS 50 09/27/2022 1530   BILITOT 0.4 09/27/2022 1530   GFRNONAA >60 09/27/2022 1530   GFRAA >60 01/07/2020 1055   GFRAA >60 07/30/2019 0825    No results found for: "TOTALPROTELP", "ALBUMINELP", "A1GS", "A2GS", "BETS", "BETA2SER", "GAMS", "MSPIKE", "SPEI"  Lab Results  Component Value Date   WBC 7.3 09/27/2022   NEUTROABS 4.7 09/27/2022   HGB 11.7 (L) 09/27/2022   HCT 35.4 (L) 09/27/2022   MCV 91.9 09/27/2022   PLT 202 09/27/2022    No results found for: "LABCA2"  No components found for: "WUJWJX914"  No results for input(s): "INR" in the last 168 hours.  No results found for: "LABCA2"  No results found for: "NWG956"  No results found for: "CAN125"  No results found for: "CAN153"  No results found for: "CA2729"  No components found for: "HGQUANT"  No results found for: "CEA1", "CEA" / No results found for: "CEA1", "CEA"   No results found for: "AFPTUMOR"  No results found for: "CHROMOGRNA"  No results found for: "KPAFRELGTCHN",  "  LAMBDASER", "KAPLAMBRATIO" (kappa/lambda light chains)  No results found for: "HGBA", "HGBA2QUANT", "HGBFQUANT", "HGBSQUAN" (Hemoglobinopathy evaluation)   No results found for: "LDH"  Lab Results  Component Value Date   IRON 56 03/15/2021   TIBC 295 03/15/2021   IRONPCTSAT 19 (L) 03/15/2021   (Iron and TIBC)  Lab Results  Component Value Date   FERRITIN 309 (H) 03/15/2021    Urinalysis    Component Value Date/Time   COLORURINE YELLOW 11/26/2019 1319   APPEARANCEUR CLEAR 11/26/2019 1319   LABSPEC 1.030 11/26/2019 1319   PHURINE 5.0 11/26/2019 1319   GLUCOSEU NEGATIVE 11/26/2019 1319   HGBUR NEGATIVE 11/26/2019 1319   BILIRUBINUR NEGATIVE 11/26/2019 1319   KETONESUR NEGATIVE 11/26/2019 1319   PROTEINUR NEGATIVE 11/26/2019 1319   NITRITE NEGATIVE 11/26/2019 1319   LEUKOCYTESUR TRACE (A) 11/26/2019 1319    STUDIES: MM 3D DIAGNOSTIC MAMMOGRAM BILATERAL BREAST  Result Date: 09/19/2022 CLINICAL DATA:  Status post malignant right lumpectomy in 2021. EXAM: DIGITAL DIAGNOSTIC BILATERAL MAMMOGRAM WITH TOMOSYNTHESIS TECHNIQUE: Bilateral digital diagnostic mammography and breast tomosynthesis was performed. COMPARISON:  Previous exam(s). ACR Breast Density Category b: There are scattered areas of fibroglandular density. FINDINGS: Stable post lumpectomy changes are demonstrated in central right breast. No new or suspicious mammographic findings are identified either breast. The parenchymal pattern is unchanged. IMPRESSION: 1. No mammographic evidence of malignancy in either breast. 2. Stable right breast posttreatment changes. RECOMMENDATION: Per protocol, as the patient is now 2 or more years status post lumpectomy, she may return to annual screening mammography in 1 year. However, given the history of breast cancer, the patient remains eligible for annual diagnostic mammography if preferred. I have discussed the findings and recommendations with the patient. If applicable, a reminder  letter will be sent to the patient regarding the next appointment. BI-RADS CATEGORY  2: Benign. Electronically Signed   By: Sande Brothers M.D.   On: 09/19/2022 16:12   ELIGIBLE FOR AVAILABLE RESEARCH PROTOCOL: AET  ASSESSMENT: 62 y.o. 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), with an MIB-1 of 90%  (1) neoadjuvant chemotherapy consisting of cyclophosphamide and doxorubicin in dose dense fashion x4 started 08/07/2019, completed 09/18/2019, followed by paclitaxel and carboplatin weekly started 10/08/2019  (a) echo 08/05/2019 showed an ejection fraction in the 60-65% range  (b) paclitaxel/carboplatinum discontinued after 9 doses (12/03/2019) secondary to grade 1 peripheral neuropathy  (2) status post right lumpectomy and sentinel lymph node sampling 01/08/2020 showing a complete pathologic response (ypT0 ypN0)  (a) a single right axillary lymph node was removed  (3) adjuvant radiation 02/12/2020 through 03/09/2020  (4) genetics testing 11/06/2019 through the [facility] Multi-Cancer Panel + Wilms Tumor Panel found no deleterious mutations in AIP, ALK, APC, ATM, AXIN2,BAP1,  BARD1, BLM, BMPR1A, BRCA1, BRCA2, BRIP1, CASR, CDC73, CDH1, CDK4, CDKN1B, CDKN1C, CDKN2A (p14ARF), CDKN2A (p16INK4a), CEBPA, CHEK2, CTNNA1, DICER1, DIS3L2, EGFR (c.2369C>T, p.Thr790Met variant only), EPCAM (Deletion/duplication testing only), FH, FLCN, GATA2, GPC3, GREM1 (Promoter region deletion/duplication testing only), HOXB13 (c.251G>A, p.Gly84Glu), HRAS, KIT, MAX, MEN1, MET, MITF (c.952G>A, p.Glu318Lys variant only), MLH1, MSH2, MSH3, MSH6, MUTYH, NBN, NF1, NF2, NTHL1, PALB2, PDGFRA, PHOX2B, PMS2, POLD1, POLE, POT1, PRKAR1A, PTCH1, PTEN, RAD50, RAD51C, RAD51D, RB1, RECQL4, RET, RNF43, RUNX1, SDHAF2, SDHA (sequence changes only), SDHB, SDHC, SDHD, SMAD4, SMARCA4, SMARCB1,  SMARCE1, STK11, SUFU, TERC, TERT, TMEM127, TP53, TSC1, TSC2, VHL, WRN and WT1. The Wilms Tumor Panel offered by [facility] includes sequencing and  deletion/duplication testing of the following 7 genes: CDC73, CDKN1C, CTR9, DIS3L2, GPC3, REST, and WT1.  (a) Three variants of uncertain significance were detected - one in the AXIN2 gene called c.13A>G, one in the RNF43 gene called c.1770G>C, and one in the [facility] gene called c.85C>G.   (5) on prophylactic tamoxifen started March 2022  (a) s/p hysterectomy  (b) s/p HRT w/o complications   PLAN:  She is currently on tamoxifen and has been tolerating this very well.  She denies any new complaints. No concerns on PE exam except as mentioned above  With regards to the faint heart murmur, I have encouraged her to talk to her PCP about this since she is clinically asymptomatic. Total time spent: 30 minutes  *Total Encounter Time as defined by the Centers for Medicare and Medicaid Services in  She wants to continue mammograms at this time but is not quite sure about doing MRIs annually.  She wants to think about it.Most recent MRI without any concern for malignancy.cludes, in addition to the face-to-face time of a patient visit (documented in the note above) non-face-to-face time: obtaining and reviewing outside history, ordering and reviewing medications, tests or procedures, care coordination (communications with other health care professionals or caregivers) and documentation in the medical record.

## 2022-10-31 ENCOUNTER — Inpatient Hospital Stay: Payer: BC Managed Care – PPO | Attending: Hematology and Oncology | Admitting: Hematology and Oncology

## 2022-11-14 ENCOUNTER — Ambulatory Visit
Admission: RE | Admit: 2022-11-14 | Discharge: 2022-11-14 | Disposition: A | Discharge status: Home / Self Care | Payer: BC Managed Care – PPO | Source: Ambulatory Visit | Attending: Pain Medicine | Admitting: Pain Medicine

## 2022-11-14 DIAGNOSIS — Z780 Asymptomatic menopausal state: Secondary | ICD-10-CM

## 2022-12-03 ENCOUNTER — Other Ambulatory Visit: Payer: Self-pay | Admitting: Hematology and Oncology

## 2022-12-04 ENCOUNTER — Encounter: Payer: Self-pay | Admitting: Oncology

## 2023-02-20 ENCOUNTER — Encounter: Payer: Self-pay | Admitting: Oncology

## 2023-02-20 NOTE — Telephone Encounter (Signed)
TC

## 2023-02-28 ENCOUNTER — Other Ambulatory Visit: Payer: Self-pay | Admitting: *Deleted

## 2023-02-28 NOTE — Telephone Encounter (Signed)
Pt left VM stating concern that she is not scheduled for her follow up with MD in November per her last visit.  She is also concerned about refills on her tamoxifen.  Per chart- no appt noted for MD follow up- scheduling request sent for an appt in later November 2024.  Noted refills sent per prior visit in July 2024 of tamoxifen with refills x 1 year- called pt's pharmacy and verified as correct.  This RN attempted to call pt and obtained VM- detailed message left per above.

## 2023-03-01 ENCOUNTER — Telehealth: Payer: Self-pay | Admitting: Hematology and Oncology

## 2023-03-01 NOTE — Telephone Encounter (Signed)
Spoke with patient confirming upcoming appointment  

## 2023-03-27 ENCOUNTER — Other Ambulatory Visit: Payer: Self-pay | Admitting: Hematology and Oncology

## 2023-03-27 DIAGNOSIS — Z170 Estrogen receptor positive status [ER+]: Secondary | ICD-10-CM

## 2023-03-28 ENCOUNTER — Inpatient Hospital Stay: Payer: BC Managed Care – PPO | Attending: Hematology and Oncology | Admitting: Hematology and Oncology

## 2023-03-28 ENCOUNTER — Inpatient Hospital Stay: Payer: BC Managed Care – PPO

## 2023-03-28 VITALS — BP 147/59 | HR 74 | Temp 97.3°F | Resp 16 | Wt 205.5 lb

## 2023-03-28 DIAGNOSIS — Z923 Personal history of irradiation: Secondary | ICD-10-CM | POA: Insufficient documentation

## 2023-03-28 DIAGNOSIS — Z87891 Personal history of nicotine dependence: Secondary | ICD-10-CM | POA: Diagnosis not present

## 2023-03-28 DIAGNOSIS — Z170 Estrogen receptor positive status [ER+]: Secondary | ICD-10-CM

## 2023-03-28 DIAGNOSIS — C50211 Malignant neoplasm of upper-inner quadrant of right female breast: Secondary | ICD-10-CM | POA: Diagnosis not present

## 2023-03-28 DIAGNOSIS — Z9221 Personal history of antineoplastic chemotherapy: Secondary | ICD-10-CM | POA: Insufficient documentation

## 2023-03-28 DIAGNOSIS — Z853 Personal history of malignant neoplasm of breast: Secondary | ICD-10-CM | POA: Diagnosis present

## 2023-03-28 LAB — CBC WITH DIFFERENTIAL (CANCER CENTER ONLY)
Abs Immature Granulocytes: 0.01 10*3/uL (ref 0.00–0.07)
Basophils Absolute: 0 10*3/uL (ref 0.0–0.1)
Basophils Relative: 1 %
Eosinophils Absolute: 0.1 10*3/uL (ref 0.0–0.5)
Eosinophils Relative: 2 %
HCT: 37.8 % (ref 36.0–46.0)
Hemoglobin: 12.3 g/dL (ref 12.0–15.0)
Immature Granulocytes: 0 %
Lymphocytes Relative: 25 %
Lymphs Abs: 2.2 10*3/uL (ref 0.7–4.0)
MCH: 30 pg (ref 26.0–34.0)
MCHC: 32.5 g/dL (ref 30.0–36.0)
MCV: 92.2 fL (ref 80.0–100.0)
Monocytes Absolute: 0.5 10*3/uL (ref 0.1–1.0)
Monocytes Relative: 6 %
Neutro Abs: 5.7 10*3/uL (ref 1.7–7.7)
Neutrophils Relative %: 66 %
Platelet Count: 250 10*3/uL (ref 150–400)
RBC: 4.1 MIL/uL (ref 3.87–5.11)
RDW: 12.8 % (ref 11.5–15.5)
WBC Count: 8.5 10*3/uL (ref 4.0–10.5)
nRBC: 0 % (ref 0.0–0.2)

## 2023-03-28 LAB — CMP (CANCER CENTER ONLY)
ALT: 11 U/L (ref 0–44)
AST: 15 U/L (ref 15–41)
Albumin: 4.2 g/dL (ref 3.5–5.0)
Alkaline Phosphatase: 58 U/L (ref 38–126)
Anion gap: 5 (ref 5–15)
BUN: 16 mg/dL (ref 8–23)
CO2: 28 mmol/L (ref 22–32)
Calcium: 9.4 mg/dL (ref 8.9–10.3)
Chloride: 106 mmol/L (ref 98–111)
Creatinine: 0.7 mg/dL (ref 0.44–1.00)
GFR, Estimated: >60 mL/min (ref >60)
Glucose, Bld: 84 mg/dL (ref 70–99)
Potassium: 3.9 mmol/L (ref 3.5–5.1)
Sodium: 139 mmol/L (ref 135–145)
Total Bilirubin: 0.4 mg/dL (ref <1.2)
Total Protein: 6.9 g/dL (ref 6.5–8.1)

## 2023-03-28 NOTE — Progress Notes (Signed)
[facility]  Telephone:([phone]) [phone] Fax:(336) [phone]     ID: ANNALYSSA CARDELL DOB: 12/25/1960  MR#: [id number]  IEP#:[id number]  Patient Care Team: Ileana Ladd, MD (Inactive) as PCP - General (Family Medicine) Pershing Proud, RN as Oncology Nurse Navigator Donnelly Angelica, RN as Oncology Nurse Navigator Abigail Miyamoto, MD as Consulting Physician (General Surgery) Dorothy Puffer, MD as Consulting Physician (Radiation Oncology) Rachel Moulds, MD as Consulting Physician (Hematology and Oncology) Rachel Moulds, MD OTHER MD:  CHIEF COMPLAINT: 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  Since her last visit here, she continues to have nipple crusting, no big change.  This has not gotten worse.  She did not feel any other changes in her breast.  She is understandably very anxious about this.  She continues on tamoxifen for breast cancer prevention, no adverse effects reported.  She would like to continue follow-up every 6 months with repeat labs every 6 months.  This certainly reassures her.   COVID 19 VACCINATION STATUS: Status post Pfizer x2, status post infection June 2022  HISTORY OF CURRENT ILLNESS: From the original intake note:  Jaleel herself palpated a mass in the upper-inner right breast in February 2021.  It appeared to increase in size over the next 3 weeks. Physical exam performed at [facility] 07/15/2019 confirmed a 6 mm firm, oval, palpable mass in the upper-inner right breast. She underwent bilateral diagnostic mammography with tomography and bilateral breast ultrasonography on 07/15/2019 showing: breast density category C; 4.6 cm mass in the right breast at 1 o'clock; single abnormal-appearing lymph node; bilateral benign cysts.  Accordingly on 07/21/2019 she proceeded to biopsy of the right breast area in  question. 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 Prognostic indicators significant for: estrogen receptor, 60% positive with weak staining intensity and progesterone receptor, 0% negative. Proliferation marker Ki67 at 90%. HER2 equivocal by immunohistochemistry (2+), but negative by fluorescent in situ hybridization with a signals ratio 1.24 and number per cell 2.55.  The questionable right axillary lymph node was biopsied as well and was benign (concordant).  The patient's subsequent history is as detailed below.   PAST MEDICAL HISTORY: Past Medical History:  Diagnosis Date   Blood transfusion without reported diagnosis    Cancer (HCC)    right breast   Family history of adverse reaction to anesthesia    Post-op N/V   Family history of breast cancer    Family history of colon cancer    Family history of nonmelanoma skin cancer    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HCC)    Personal history of chemotherapy    completed 09-18-2019   Personal history of radiation therapy    completed 03-09-2020    PAST SURGICAL HISTORY: Past Surgical History:  Procedure Laterality Date   ABDOMINAL HYSTERECTOMY     BREAST LUMPECTOMY WITH RADIOACTIVE SEED AND SENTINEL LYMPH NODE BIOPSY Right 01/08/2020   Procedure: RIGHT BREAST LUMPECTOMY WITH RADIOACTIVE SEED AND SENTINEL LYMPH NODE BIOPSY;  Surgeon: Abigail Miyamoto, MD;  Location: [facility] OR;  Service: General;  Laterality: Right;  LMA, PECTORAL BLOCK   COLONOSCOPY     LUNG BIOPSY     Per Care Everywhere: RUL lung biopsy 05/02/17 showed necrotizing granulomas   PORT-A-CATH REMOVAL Left 04/05/2020   Procedure: REMOVAL PORT-A-CATH;  Surgeon: Abigail Miyamoto, MD;  Location: [facility];  Service: General;  Laterality: Left;   PORTACATH PLACEMENT Left 08/06/2019   Procedure: INSERTION PORT-A-CATH WITH ULTRASOUND GUIDANCE;  Surgeon:  Abigail Miyamoto, MD;  Location: [facility];  Service: General;  Laterality: Left;   WISDOM TOOTH EXTRACTION    Status post bilateral salpingo-oophorectomy   FAMILY HISTORY: Family History  Problem Relation Age of Onset   Colon polyps Father    Colon polyps Sister    Skin cancer Sister        non-melanoma dx. in her 42s   Other Sister        normal genetic testing for hereditary cancer risks   Colon polyps Sister    Colon polyps Sister    Colon cancer Paternal Grandfather        dx. in his 28s   Wilm's tumor Son        dx. 9 months   Wilm's tumor Nephew 2   Breast cancer Other        dx. in her 63s (PGM's sister)   Breast cancer Other        dx. in her 31s (PGM's niece)   Esophageal cancer Neg Hx    Rectal cancer Neg Hx    Stomach cancer Neg Hx    Her father is 108 years old as of 07/2019. Her mother was murdered at age 56 (domestic violence).  The patient has 3 sisters and no brothers. She reports a third cousin (grandmother's sister's daughter) with breast cancer in her 33's. She denies a family history of ovarian, prostate, or pancreatic cancer. She does report colon cancer in her paternal grandfather in his 64's, non-melanoma skin cancer in her sister in her 9's, and Wilms tumor in her son at 65 months.   GYNECOLOGIC HISTORY:  Patient's last menstrual period was 05/09/2011. Menarche: 63-14 years old Age at first live birth: 62 years old GX P 3 LMP 2013 Contraceptive never used HRT used for approximately 9 months  Hysterectomy? Yes, 2013 BSO? 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 Husband Candiss Norse is an Acupuncturist. She lives at home with husband Kennedy Bucker. Daughter Mort Sawyers, age 93, is a poet and Runner, broadcasting/film/video in [place], [place]. Son Launa Flight, age 2, is an Leisure centre manager in [employer] in [place], [place]. Son Zulema Carothers, age 67, is a Public affairs consultant working in Medical laboratory scientific officer in [place], [place] (at the [employer]). Almer has three grandchildren. She is a Statistician.    ADVANCED DIRECTIVES: In the absence of any documentation to the contrary, the patient's spouse is their HCPOA.    HEALTH MAINTENANCE: Social History   Tobacco Use   Smoking status: Former    Current packs/day: 0.00    Average packs/day: 1 pack/day for 10.0 years (10.0 ttl pk-yrs)    Types: Cigarettes    Start date: 37    Quit date: 73    Years since quitting: 32.9   Smokeless tobacco: Never  Vaping Use   Vaping status: Never Used  Substance Use Topics   Alcohol use: Yes    Alcohol/week: 2.0 standard drinks of alcohol    Types: 2 Glasses of wine per week    Comment: occassionally   Drug use: Never     Colonoscopy: none on file  PAP: none on file (s/p hysterectomy)  Bone density: n/a (age)   Allergies  Allergen Reactions   Other Rash    Walnuts give  her a rash    Walnuts give her a rash Walnuts give her a rash  Walnuts give her a rash Walnuts give her a rash Walnuts give her a rash Walnuts give her a rash    Current Outpatient Medications  Medication Sig Dispense Refill   b complex vitamins capsule Take 1 capsule by mouth daily.     Multiple Vitamin (MULTI-VITAMIN DAILY PO) Take 1 tablet by mouth daily.      tamoxifen (NOLVADEX) 20 MG tablet TAKE 1 TABLET BY MOUTH EVERY DAY 90 tablet 4   No current facility-administered medications for this visit.   Facility-Administered Medications Ordered in Other Visits  Medication Dose Route Frequency Provider Last Rate Last Admin   heparin lock flush 100 unit/mL  500 Units Intracatheter Once PRN Magrinat, Valentino Hue, MD       sodium chloride flush (NS) 0.9 % injection 10 mL  10 mL Intracatheter PRN Magrinat, Valentino Hue, MD        OBJECTIVE: White woman who appears younger than stated age  Vitals:   03/28/23 1537  BP: (!) 147/59  Pulse: 74  Resp: 16  Temp: (!) 97.3 F (36.3 C)  SpO2: 99%     Body mass index is  29.49 kg/m.   Wt Readings from Last 3 Encounters:  03/28/23 205 lb 8 oz (93.2 kg)  09/27/22 210 lb 7 oz (95.5 kg)  03/21/22 219 lb 1.6 oz (99.4 kg)     ECOG FS:1 - Symptomatic but completely ambulatory  Physical Exam Constitutional:      Appearance: Normal appearance.  Cardiovascular:     Rate and Rhythm: Normal rate and regular rhythm.     Pulses: Normal pulses.     Heart sounds: Murmur (She has a faint heart murmur very subtle on exam.) heard.  Chest:     Comments: No palpable masses in both breasts.  Left breast nipple appears to be underdeveloped, this is chronic.  There is a small area of crusting, please see picture in media. No change since May. There is no palpable mass or discharge.  No regional adenopathy Musculoskeletal:        General: No swelling or tenderness.     Cervical back: Normal range of motion and neck supple. No rigidity.  Lymphadenopathy:     Cervical: No cervical adenopathy.  Skin:    General: Skin is warm and dry.  Neurological:     Mental Status: She is alert.     LAB RESULTS:  CMP     Component Value Date/Time   NA 140 09/27/2022 1530   K 3.9 09/27/2022 1530   CL 108 09/27/2022 1530   CO2 27 09/27/2022 1530   GLUCOSE 91 09/27/2022 1530   BUN 15 09/27/2022 1530   CREATININE 0.75 09/27/2022 1530   CALCIUM 8.9 09/27/2022 1530   PROT 6.4 (L) 09/27/2022 1530   ALBUMIN 4.1 09/27/2022 1530   AST 18 09/27/2022 1530   ALT 13 09/27/2022 1530   ALKPHOS 50 09/27/2022 1530   BILITOT 0.4 09/27/2022 1530   GFRNONAA >60 09/27/2022 1530   GFRAA >60 01/07/2020 1055   GFRAA >60 07/30/2019 0825    No results found for: "TOTALPROTELP", "ALBUMINELP", "A1GS", "A2GS", "BETS", "BETA2SER", "GAMS", "MSPIKE", "SPEI"  Lab Results  Component Value Date   WBC 8.5 03/28/2023   NEUTROABS 5.7 03/28/2023   HGB 12.3 03/28/2023   HCT 37.8 03/28/2023   MCV 92.2 03/28/2023   PLT 250 03/28/2023    No results found for: "  LABCA2"  No components found for:  "NWGNFA213"  No results for input(s): "INR" in the last 168 hours.  No results found for: "LABCA2"  No results found for: "YQM578"  No results found for: "CAN125"  No results found for: "CAN153"  No results found for: "CA2729"  No components found for: "HGQUANT"  No results found for: "CEA1", "CEA" / No results found for: "CEA1", "CEA"   No results found for: "AFPTUMOR"  No results found for: "CHROMOGRNA"  No results found for: "KPAFRELGTCHN", "LAMBDASER", "KAPLAMBRATIO" (kappa/lambda light chains)  No results found for: "HGBA", "HGBA2QUANT", "HGBFQUANT", "HGBSQUAN" (Hemoglobinopathy evaluation)   No results found for: "LDH"  Lab Results  Component Value Date   IRON 56 03/15/2021   TIBC 295 03/15/2021   IRONPCTSAT 19 (L) 03/15/2021   (Iron and TIBC)  Lab Results  Component Value Date   FERRITIN 309 (H) 03/15/2021    Urinalysis    Component Value Date/Time   COLORURINE YELLOW 11/26/2019 1319   APPEARANCEUR CLEAR 11/26/2019 1319   LABSPEC 1.030 11/26/2019 1319   PHURINE 5.0 11/26/2019 1319   GLUCOSEU NEGATIVE 11/26/2019 1319   HGBUR NEGATIVE 11/26/2019 1319   BILIRUBINUR NEGATIVE 11/26/2019 1319   KETONESUR NEGATIVE 11/26/2019 1319   PROTEINUR NEGATIVE 11/26/2019 1319   NITRITE NEGATIVE 11/26/2019 1319   LEUKOCYTESUR TRACE (A) 11/26/2019 1319    STUDIES: No results found.  ELIGIBLE FOR AVAILABLE RESEARCH PROTOCOL: AET  ASSESSMENT: 62 y.o. 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), with an MIB-1 of 90%  (1) neoadjuvant chemotherapy consisting of cyclophosphamide and doxorubicin in dose dense fashion x4 started 08/07/2019, completed 09/18/2019, followed by paclitaxel and carboplatin weekly started 10/08/2019  (a) echo 08/05/2019 showed an ejection fraction in the 60-65% range  (b)  paclitaxel/carboplatinum discontinued after 9 doses (12/03/2019) secondary to grade 1 peripheral neuropathy  (2) status post right lumpectomy and sentinel lymph node sampling 01/08/2020 showing a complete pathologic response (ypT0 ypN0)  (a) a single right axillary lymph node was removed  (3) adjuvant radiation 02/12/2020 through 03/09/2020  (4) genetics testing 11/06/2019 through the [facility] Multi-Cancer Panel + Wilms Tumor Panel found no deleterious mutations in AIP, ALK, APC, ATM, AXIN2,BAP1,  BARD1, BLM, BMPR1A, BRCA1, BRCA2, BRIP1, CASR, CDC73, CDH1, CDK4, CDKN1B, CDKN1C, CDKN2A (p14ARF), CDKN2A (p16INK4a), CEBPA, CHEK2, CTNNA1, DICER1, DIS3L2, EGFR (c.2369C>T, p.Thr790Met variant only), EPCAM (Deletion/duplication testing only), FH, FLCN, GATA2, GPC3, GREM1 (Promoter region deletion/duplication testing only), HOXB13 (c.251G>A, p.Gly84Glu), HRAS, KIT, MAX, MEN1, MET, MITF (c.952G>A, p.Glu318Lys variant only), MLH1, MSH2, MSH3, MSH6, MUTYH, NBN, NF1, NF2, NTHL1, PALB2, PDGFRA, PHOX2B, PMS2, POLD1, POLE, POT1, PRKAR1A, PTCH1, PTEN, RAD50, RAD51C, RAD51D, RB1, RECQL4, RET, RNF43, RUNX1, SDHAF2, SDHA (sequence changes only), SDHB, SDHC, SDHD, SMAD4, SMARCA4, SMARCB1, SMARCE1, STK11, SUFU, TERC, TERT, TMEM127, TP53, TSC1, TSC2, VHL, WRN and WT1. The Wilms Tumor Panel offered by [facility] includes sequencing and deletion/duplication testing of the following 7 genes: CDC73, CDKN1C, CTR9, DIS3L2, GPC3, REST, and WT1.  (a) Three variants of uncertain significance were detected - one in the AXIN2 gene called c.13A>G, one in the RNF43 gene called c.1770G>C, and one in the [facility] gene called c.85C>G.   (5) on prophylactic tamoxifen started March 2022  (a) s/p hysterectomy  (b) s/p HRT w/o complications   PLAN:  She is currently on tamoxifen and has been tolerating this very well.  She denies any new complaints. No concerns on PE  exam except for small area of crusting in the left nipple which has remained  stable. Most recent mammogram with no concern for malignancy.  At this time she is not interested in pursuing [employer] testing or MRIs for screening.  We have discussed that the area of nipple dryness appears to be benign but it hasn't changed. I have reached out to Dr Eliberto Ivory office if she is a candidate for punch biopsy. She will call us back if she doesn't get a phone call from Dr Eliberto Ivory office. This will certainly reassure her.  Otherwise she will continue tamoxifen.  She wants to come every 6 months with repeat labs.  CBC from today unremarkable.  CMP pending at the time of my visit.   Total time: 30 min  *Total Encounter Time as defined by the Centers for Medicare and Medicaid Services in  She wants to continue mammograms at this time but is not quite sure about doing MRIs annually.  She wants to think about it.Most recent MRI without any concern for malignancy.cludes, in addition to the face-to-face time of a patient visit (documented in the note above) non-face-to-face time: obtaining and reviewing outside history, ordering and reviewing medications, tests or procedures, care coordination (communications with other health care professionals or caregivers) and documentation in the medical record.

## 2023-03-29 ENCOUNTER — Telehealth: Payer: Self-pay | Admitting: Hematology and Oncology

## 2023-03-29 NOTE — Telephone Encounter (Signed)
 Left patient a vm regarding upcoming appointment

## 2023-04-24 ENCOUNTER — Other Ambulatory Visit: Payer: Self-pay | Admitting: Surgery

## 2023-04-25 LAB — SURGICAL PATHOLOGY

## 2023-05-07 ENCOUNTER — Encounter: Payer: Self-pay | Admitting: Oncology

## 2023-09-10 ENCOUNTER — Other Ambulatory Visit: Payer: Self-pay | Admitting: Hematology and Oncology

## 2023-09-10 DIAGNOSIS — Z1231 Encounter for screening mammogram for malignant neoplasm of breast: Secondary | ICD-10-CM

## 2023-09-13 ENCOUNTER — Encounter (HOSPITAL_COMMUNITY): Payer: Self-pay

## 2023-09-20 ENCOUNTER — Encounter: Payer: Self-pay | Admitting: Oncology

## 2023-09-20 ENCOUNTER — Telehealth: Payer: Self-pay | Admitting: Hematology and Oncology

## 2023-09-20 ENCOUNTER — Ambulatory Visit
Admission: RE | Admit: 2023-09-20 | Discharge: 2023-09-20 | Disposition: A | Discharge status: Home / Self Care | Payer: Self-pay | Source: Ambulatory Visit | Attending: Hematology and Oncology | Admitting: Hematology and Oncology

## 2023-09-20 DIAGNOSIS — Z1231 Encounter for screening mammogram for malignant neoplasm of breast: Secondary | ICD-10-CM

## 2023-09-20 NOTE — Telephone Encounter (Signed)
 Left patient a vm regarding upcoming appointment

## 2023-09-25 ENCOUNTER — Encounter: Payer: Self-pay | Admitting: *Deleted

## 2023-09-26 ENCOUNTER — Inpatient Hospital Stay: Payer: BC Managed Care – PPO

## 2023-09-26 ENCOUNTER — Inpatient Hospital Stay: Attending: Hematology and Oncology | Admitting: Hematology and Oncology

## 2023-09-26 ENCOUNTER — Inpatient Hospital Stay: Payer: BC Managed Care – PPO | Admitting: Hematology and Oncology

## 2023-09-26 VITALS — BP 162/72 | HR 82 | Temp 98.7°F | Resp 17 | Wt 209.5 lb

## 2023-09-26 DIAGNOSIS — Z800 Family history of malignant neoplasm of digestive organs: Secondary | ICD-10-CM | POA: Diagnosis not present

## 2023-09-26 DIAGNOSIS — Z9221 Personal history of antineoplastic chemotherapy: Secondary | ICD-10-CM | POA: Insufficient documentation

## 2023-09-26 DIAGNOSIS — N644 Mastodynia: Secondary | ICD-10-CM | POA: Diagnosis not present

## 2023-09-26 DIAGNOSIS — Z87891 Personal history of nicotine dependence: Secondary | ICD-10-CM | POA: Insufficient documentation

## 2023-09-26 DIAGNOSIS — Z170 Estrogen receptor positive status [ER+]: Secondary | ICD-10-CM | POA: Diagnosis not present

## 2023-09-26 DIAGNOSIS — M8580 Other specified disorders of bone density and structure, unspecified site: Secondary | ICD-10-CM | POA: Diagnosis not present

## 2023-09-26 DIAGNOSIS — Z803 Family history of malignant neoplasm of breast: Secondary | ICD-10-CM | POA: Diagnosis not present

## 2023-09-26 DIAGNOSIS — Z923 Personal history of irradiation: Secondary | ICD-10-CM | POA: Diagnosis not present

## 2023-09-26 DIAGNOSIS — C50211 Malignant neoplasm of upper-inner quadrant of right female breast: Secondary | ICD-10-CM | POA: Diagnosis present

## 2023-09-26 DIAGNOSIS — Z79810 Long term (current) use of selective estrogen receptor modulators (SERMs): Secondary | ICD-10-CM | POA: Insufficient documentation

## 2023-09-26 DIAGNOSIS — Z171 Estrogen receptor negative status [ER-]: Secondary | ICD-10-CM | POA: Diagnosis not present

## 2023-09-26 NOTE — Progress Notes (Signed)
 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   Discussed the use of AI scribe software for clinical note transcription with the patient, who gave verbal consent to proceed.  History of Present Illness Debra [name] is a 63 year old female with breast cancer who presents for follow-up regarding her breast cancer treatment and symptoms.  Diagnosed with breast cancer in March 2021, she underwent chemotherapy, lumpectomy, and radiation. She has been on tamoxifen  since March 2022. Her current medications include tamoxifen  and a multivitamin.  She is concerned about the delay in receiving her recent screening mammogram results, which caused anxiety. She was later informed that the results were not concerning.  She experiences persistent soreness and achiness in her right breast since her surgery, with worsening pain over the past month, especially after her recent mammogram. Discomfort is noted under her arm and across her breast, but there are no new lumps. She has not engaged in unusual physical activities that might explain the increased pain.  She has a past issue with her left nipple appearing crusted,' with a biopsy in December 2024 showing inflamed skin but no cancer. This condition  remains unchanged.  She visited an OB GYN for burning and discomfort, initially suspecting a urinary tract infection, which was ruled out. She recalls similar symptoms during chemotherapy, which resolved over time. No current vaginal dryness.  Her bone density test in July 2024 indicated mild osteopenia. Her last blood work in November 2024 was normal. She is scheduled for annual blood work while on tamoxifen .     COVID 19 VACCINATION STATUS: Status post Pfizer x2, status post infection June 2022  HISTORY OF CURRENT ILLNESS: From the original intake note:  Debra [name] herself palpated a mass in the upper-inner right breast in February 2021.  It appeared to increase in size over the next 3 weeks. Physical exam performed at [facility] 07/15/2019 confirmed a 6 mm firm, oval, palpable mass in the upper-inner right breast. She underwent bilateral diagnostic mammography with tomography and bilateral breast ultrasonography on 07/15/2019 showing: breast density category C; 4.6 cm mass in the right breast at 1 o'clock; single abnormal-appearing lymph node; bilateral benign cysts.  Accordingly on 07/21/2019 she proceeded to biopsy of the right breast area in question. 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 Prognostic indicators significant for: estrogen receptor, 60% positive with weak staining intensity and progesterone receptor, 0% negative. Proliferation marker Ki67 at 90%. HER2 equivocal by immunohistochemistry (2+), but negative by fluorescent in situ hybridization with a signals ratio 1.24 and number per cell 2.55.  The questionable right axillary lymph node was biopsied as well and was benign (concordant).  The patient's subsequent history is as detailed below.   PAST MEDICAL HISTORY: Past Medical History:  Diagnosis Date   Blood transfusion without reported diagnosis  Cancer [facility])    right  breast   Family history of adverse reaction to anesthesia    Post-op N/V   Family history of breast cancer    Family history of colon cancer    Family history of nonmelanoma skin cancer    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HCC)    Personal history of chemotherapy    completed 09-18-2019   Personal history of radiation therapy    completed 03-09-2020    PAST SURGICAL HISTORY: Past Surgical History:  Procedure Laterality Date   ABDOMINAL HYSTERECTOMY     BREAST LUMPECTOMY Right 01/08/2020   BREAST LUMPECTOMY WITH RADIOACTIVE SEED AND SENTINEL LYMPH NODE BIOPSY Right 01/08/2020   Procedure: RIGHT BREAST LUMPECTOMY WITH RADIOACTIVE SEED AND SENTINEL LYMPH NODE BIOPSY;  Surgeon: Oza Blumenthal, MD;  Location: [facility] OR;  Service: General;  Laterality: Right;  LMA, PECTORAL BLOCK   COLONOSCOPY     LUNG BIOPSY     Per Care Everywhere: RUL lung biopsy 05/02/17 showed necrotizing granulomas   PORT-A-CATH REMOVAL Left 04/05/2020   Procedure: REMOVAL PORT-A-CATH;  Surgeon: Oza Blumenthal, MD;  Location: [facility];  Service: General;  Laterality: Left;   PORTACATH PLACEMENT Left 08/06/2019   Procedure: INSERTION PORT-A-CATH WITH ULTRASOUND GUIDANCE;  Surgeon: Oza Blumenthal, MD;  Location: [facility];  Service: General;  Laterality: Left;   WISDOM TOOTH EXTRACTION    Status post bilateral salpingo-oophorectomy   FAMILY HISTORY: Family History  Problem Relation Age of Onset   Colon polyps Father    Colon polyps Sister    Skin cancer Sister        non-melanoma dx. in her 10s   Other Sister        normal genetic testing for hereditary cancer risks   Colon polyps Sister    Colon polyps Sister    Colon cancer Paternal Grandfather        dx. in his 22s   Wilm's tumor Son        dx. 9 months   Wilm's tumor Nephew 2   Breast cancer Other        dx. in her 7s (PGM's sister)   Breast cancer Other        dx. in her 59s (PGM's niece)    Esophageal cancer Neg Hx    Rectal cancer Neg Hx    Stomach cancer Neg Hx    Her father is 66 years old as of 07/2019. Her mother was murdered at age 4 (domestic violence).  The patient has 3 sisters and no brothers. She reports a third cousin (grandmother's sister's daughter) with breast cancer in her 23's. She denies a family history of ovarian, prostate, or pancreatic cancer. She does report colon cancer in her paternal grandfather in his 34's, non-melanoma skin cancer in her sister in her 68's, and Wilms tumor in her son at 56 months.   GYNECOLOGIC HISTORY:  Patient's last menstrual period was 05/09/2011. Menarche: 16-27 years old Age at first live birth: 63 years old GX P 3 LMP 2013 Contraceptive never used HRT used for approximately 9 months  Hysterectomy? Yes, 2013 BSO? 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 Husband Gaylyn Keas is an Acupuncturist. She lives at home with husband Debra [name] Daughter Debra [name], age 33, is a poet and Runner, broadcasting/film/video in [place], [place]. Son Debra [name], age 71, is an Leisure centre manager in [place] in [place], [place]. Son Debra [name], age 47, is  a Public affairs consultant working in Medical laboratory scientific officer in [place], [place] (at the [employer]). Deanna has three grandchildren. She is a Statistician.    ADVANCED DIRECTIVES: In the absence of any documentation to the contrary, the patient's spouse is their HCPOA.    HEALTH MAINTENANCE: Social History   Tobacco Use   Smoking status: Former    Current packs/day: 0.00    Average packs/day: 1 pack/day for 10.0 years (10.0 ttl pk-yrs)    Types: Cigarettes    Start date: 59    Quit date: 12    Years since quitting: 33.4   Smokeless tobacco: Never  Vaping Use   Vaping status: Never Used  Substance Use Topics   Alcohol use: Yes    Alcohol/week: 2.0 standard drinks of alcohol    Types: 2 Glasses of wine per week    Comment: occassionally   Drug  use: Never     Colonoscopy: none on file  PAP: none on file (s/p hysterectomy)  Bone density: n/a (age)   Allergies  Allergen Reactions   Other Rash    Walnuts give her a rash    Walnuts give her a rash Walnuts give her a rash  Walnuts give her a rash Walnuts give her a rash Walnuts give her a rash Walnuts give her a rash    Current Outpatient Medications  Medication Sig Dispense Refill   b complex vitamins capsule Take 1 capsule by mouth daily.     Multiple Vitamin (MULTI-VITAMIN DAILY PO) Take 1 tablet by mouth daily.      tamoxifen  (NOLVADEX ) 20 MG tablet TAKE 1 TABLET BY MOUTH EVERY DAY 90 tablet 4   No current facility-administered medications for this visit.   Facility-Administered Medications Ordered in Other Visits  Medication Dose Route Frequency Provider Last Rate Last Admin   heparin  lock flush 100 unit/mL  500 Units Intracatheter Once PRN Magrinat, Gustav C, MD       sodium chloride  flush (NS) 0.9 % injection 10 mL  10 mL Intracatheter PRN Magrinat, Rozella Cornfield, MD        OBJECTIVE: White woman who appears younger than stated age  Vitals:   09/26/23 1522  BP: (!) 162/72  Pulse: 82  Resp: 17  Temp: 98.7 F (37.1 C)  SpO2: 100%     Body mass index is 30.06 kg/m.   Wt Readings from Last 3 Encounters:  09/26/23 209 lb 8 oz (95 kg)  03/28/23 205 lb 8 oz (93.2 kg)  09/27/22 210 lb 7 oz (95.5 kg)     ECOG FS:1 - Symptomatic but completely ambulatory  Physical Exam Constitutional:      Appearance: Normal appearance.  Cardiovascular:     Rate and Rhythm: Normal rate and regular rhythm.     Pulses: Normal pulses.     Heart sounds: Murmur (She has a faint heart murmur very subtle on exam.) heard.  Chest:     Comments: No palpable masses in both breasts.  Left breast nipple appears to be underdeveloped, this is chronic.  There is a small area of crusting, please see picture in media. No change since May. There is no palpable mass or discharge.  No regional  adenopathy Musculoskeletal:        General: No swelling or tenderness.     Cervical back: Normal range of motion and neck supple. No rigidity.  Lymphadenopathy:     Cervical: No cervical adenopathy.  Skin:    General: Skin is warm and  dry.  Neurological:     Mental Status: She is alert.    LAB RESULTS:  CMP     Component Value Date/Time   NA 139 03/28/2023 1511   K 3.9 03/28/2023 1511   CL 106 03/28/2023 1511   CO2 28 03/28/2023 1511   GLUCOSE 84 03/28/2023 1511   BUN 16 03/28/2023 1511   CREATININE 0.70 03/28/2023 1511   CALCIUM 9.4 03/28/2023 1511   PROT 6.9 03/28/2023 1511   ALBUMIN 4.2 03/28/2023 1511   AST 15 03/28/2023 1511   ALT 11 03/28/2023 1511   ALKPHOS 58 03/28/2023 1511   BILITOT 0.4 03/28/2023 1511   GFRNONAA >60 03/28/2023 1511   GFRAA >60 01/07/2020 1055   GFRAA >60 07/30/2019 0825    No results found for: "TOTALPROTELP", "ALBUMINELP", "A1GS", "A2GS", "BETS", "BETA2SER", "GAMS", "MSPIKE", "SPEI"  Lab Results  Component Value Date   WBC 8.5 03/28/2023   NEUTROABS 5.7 03/28/2023   HGB 12.3 03/28/2023   HCT 37.8 03/28/2023   MCV 92.2 03/28/2023   PLT 250 03/28/2023    No results found for: "LABCA2"  No components found for: "WUXLKG401"  No results for input(s): "INR" in the last 168 hours.  No results found for: "LABCA2"  No results found for: "UUV253"  No results found for: "CAN125"  No results found for: "CAN153"  No results found for: "CA2729"  No components found for: "HGQUANT"  No results found for: "CEA1", "CEA" / No results found for: "CEA1", "CEA"   No results found for: "AFPTUMOR"  No results found for: "CHROMOGRNA"  No results found for: "KPAFRELGTCHN", "LAMBDASER", "KAPLAMBRATIO" (kappa/lambda light chains)  No results found for: "HGBA", "HGBA2QUANT", "HGBFQUANT", "HGBSQUAN" (Hemoglobinopathy evaluation)   No results found for: "LDH"  Lab Results  Component Value Date   IRON 56 03/15/2021   TIBC 295 03/15/2021    IRONPCTSAT 19 (L) 03/15/2021   (Iron and TIBC)  Lab Results  Component Value Date   FERRITIN 309 (H) 03/15/2021    Urinalysis    Component Value Date/Time   COLORURINE YELLOW 11/26/2019 1319   APPEARANCEUR CLEAR 11/26/2019 1319   LABSPEC 1.030 11/26/2019 1319   PHURINE 5.0 11/26/2019 1319   GLUCOSEU NEGATIVE 11/26/2019 1319   HGBUR NEGATIVE 11/26/2019 1319   BILIRUBINUR NEGATIVE 11/26/2019 1319   KETONESUR NEGATIVE 11/26/2019 1319   PROTEINUR NEGATIVE 11/26/2019 1319   NITRITE NEGATIVE 11/26/2019 1319   LEUKOCYTESUR TRACE (A) 11/26/2019 1319    STUDIES: MM 3D SCREENING MAMMOGRAM BILATERAL BREAST Result Date: 09/26/2023 CLINICAL DATA:  Screening. EXAM: DIGITAL SCREENING BILATERAL MAMMOGRAM WITH TOMOSYNTHESIS AND CAD TECHNIQUE: Bilateral screening digital craniocaudal and mediolateral oblique mammograms were obtained. Bilateral screening digital breast tomosynthesis was performed. The images were evaluated with computer-aided detection. COMPARISON:  Previous exam(s). ACR Breast Density Category b: There are scattered areas of fibroglandular density. FINDINGS: Benign post lumpectomy changes on the right. There are no findings suspicious for malignancy. IMPRESSION: No mammographic evidence of malignancy. A result letter of this screening mammogram will be mailed directly to the patient. RECOMMENDATION: Screening mammogram in one year. (Code:SM-B-01Y) BI-RADS CATEGORY  2: Benign. Electronically Signed   By: Amanda Jungling M.D.   On: 09/26/2023 11:48    ELIGIBLE FOR AVAILABLE RESEARCH PROTOCOL: AET  ASSESSMENT: 63 y.o. 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), with an MIB-1 of 90%  (1) neoadjuvant chemotherapy consisting of cyclophosphamide  and doxorubicin   in dose dense fashion x4 started 08/07/2019, completed 09/18/2019,  followed by paclitaxel  and carboplatin  weekly started 10/08/2019  (a) echo 08/05/2019 showed an ejection fraction in the 60-65% range  (b) paclitaxel /carboplatinum discontinued after 9 doses (12/03/2019) secondary to grade 1 peripheral neuropathy  (2) status post right lumpectomy and sentinel lymph node sampling 01/08/2020 showing a complete pathologic response (ypT0 ypN0)  (a) a single right axillary lymph node was removed  (3) adjuvant radiation 02/12/2020 through 03/09/2020  (4) genetics testing 11/06/2019 through the [facility] Multi-Cancer Panel + Wilms Tumor Panel found no deleterious mutations in AIP, ALK, APC, ATM, AXIN2,BAP1,  BARD1, BLM, BMPR1A, BRCA1, BRCA2, BRIP1, CASR, CDC73, CDH1, CDK4, CDKN1B, CDKN1C, CDKN2A (p14ARF), CDKN2A (p16INK4a), CEBPA, CHEK2, CTNNA1, DICER1, DIS3L2, EGFR (c.2369C>T, p.Thr790Met variant only), EPCAM (Deletion/duplication testing only), FH, FLCN, GATA2, GPC3, GREM1 (Promoter region deletion/duplication testing only), HOXB13 (c.251G>A, p.Gly84Glu), HRAS, KIT, MAX, MEN1, MET, MITF (c.952G>A, p.Glu318Lys variant only), MLH1, MSH2, MSH3, MSH6, MUTYH, NBN, NF1, NF2, NTHL1, PALB2, PDGFRA, PHOX2B, PMS2, POLD1, POLE, POT1, PRKAR1A, PTCH1, PTEN, RAD50, RAD51C, RAD51D, RB1, RECQL4, RET, RNF43, RUNX1, SDHAF2, SDHA (sequence changes only), SDHB, SDHC, SDHD, SMAD4, SMARCA4, SMARCB1, SMARCE1, STK11, SUFU, TERC, TERT, TMEM127, TP53, TSC1, TSC2, VHL, WRN and WT1. The Wilms Tumor Panel offered by [facility] includes sequencing and deletion/duplication testing of the following 7 genes: CDC73, CDKN1C, CTR9, DIS3L2, GPC3, REST, and WT1.  (a) Three variants of uncertain significance were detected - one in the AXIN2 gene called c.13A>G, one in the RNF43 gene called c.1770G>C, and one in the SDHC gene called c.85C>G.   (5) on prophylactic tamoxifen  started March 2022  (a) s/p hysterectomy  (b) s/p HRT w/o complications   PLAN:  Assessment and Plan Assessment & Plan Breast cancer,  post-treatment No evidence of recurrence on recent mammogram. Discussed Guardant reveal test for tumor detection,  She showed interest in further research. - Continue tamoxifen . - Order diagnostic mammogram for next screening. - Provide information on Guardant Reveal blood test.  Post-surgical breast pain Pain likely due to post-surgical changes, not recurrence. Discussed physical therapy and compression bra for support. - Recommend wearing a compression bra during physical activities and at night. - Consider physical therapy if symptoms persist.  Mild osteopenia No treatment needed. Emphasized lifestyle modifications. - Encourage regular walking. - Recommend vitamin D and calcium supplements.     Total time: 30 min  *Total Encounter Time as defined by the Centers for Medicare and Medicaid Services in  She wants to continue mammograms at this time but is not quite sure about doing MRIs annually.  She wants to think about it.Most recent MRI without any concern for malignancy.cludes, in addition to the face-to-face time of a patient visit (documented in the note above) non-face-to-face time: obtaining and reviewing outside history, ordering and reviewing medications, tests or procedures, care coordination (communications with other health care professionals or caregivers) and documentation in the medical record.

## 2023-09-27 ENCOUNTER — Encounter: Payer: Self-pay | Admitting: Oncology

## 2023-10-16 ENCOUNTER — Telehealth: Payer: Self-pay

## 2023-10-16 NOTE — Telephone Encounter (Signed)
 Left a voicemail confirming appt for 6/11

## 2023-10-17 ENCOUNTER — Inpatient Hospital Stay: Payer: Self-pay

## 2023-10-17 ENCOUNTER — Inpatient Hospital Stay: Payer: Self-pay | Admitting: Hematology and Oncology

## 2024-02-02 ENCOUNTER — Emergency Department (HOSPITAL_COMMUNITY)
Admission: EM | Admit: 2024-02-02 | Discharge: 2024-02-02 | Disposition: A | Discharge status: Home / Self Care | Attending: Emergency Medicine | Admitting: Emergency Medicine

## 2024-02-02 ENCOUNTER — Other Ambulatory Visit: Payer: Self-pay

## 2024-02-02 ENCOUNTER — Encounter (HOSPITAL_COMMUNITY): Payer: Self-pay

## 2024-02-02 ENCOUNTER — Emergency Department (HOSPITAL_COMMUNITY)

## 2024-02-02 DIAGNOSIS — Z853 Personal history of malignant neoplasm of breast: Secondary | ICD-10-CM | POA: Insufficient documentation

## 2024-02-02 DIAGNOSIS — M79604 Pain in right leg: Secondary | ICD-10-CM | POA: Diagnosis present

## 2024-02-02 DIAGNOSIS — M79605 Pain in left leg: Secondary | ICD-10-CM | POA: Insufficient documentation

## 2024-02-02 DIAGNOSIS — R0789 Other chest pain: Secondary | ICD-10-CM

## 2024-02-02 LAB — CBC WITH DIFFERENTIAL/PLATELET
Abs Immature Granulocytes: 0.02 K/uL (ref 0.00–0.07)
Basophils Absolute: 0.1 K/uL (ref 0.0–0.1)
Basophils Relative: 1 %
Eosinophils Absolute: 0.3 K/uL (ref 0.0–0.5)
Eosinophils Relative: 3 %
HCT: 39 % (ref 36.0–46.0)
Hemoglobin: 12.7 g/dL (ref 12.0–15.0)
Immature Granulocytes: 0 %
Lymphocytes Relative: 26 %
Lymphs Abs: 2.4 K/uL (ref 0.7–4.0)
MCH: 30 pg (ref 26.0–34.0)
MCHC: 32.6 g/dL (ref 30.0–36.0)
MCV: 92.2 fL (ref 80.0–100.0)
Monocytes Absolute: 0.7 K/uL (ref 0.1–1.0)
Monocytes Relative: 7 %
Neutro Abs: 5.7 K/uL (ref 1.7–7.7)
Neutrophils Relative %: 63 %
Platelets: 218 K/uL (ref 150–400)
RBC: 4.23 MIL/uL (ref 3.87–5.11)
RDW: 12.5 % (ref 11.5–15.5)
WBC: 9.1 K/uL (ref 4.0–10.5)
nRBC: 0 % (ref 0.0–0.2)

## 2024-02-02 LAB — BASIC METABOLIC PANEL WITH GFR
Anion gap: 11 (ref 5–15)
BUN: 22 mg/dL (ref 8–23)
CO2: 24 mmol/L (ref 22–32)
Calcium: 8.8 mg/dL — ABNORMAL LOW (ref 8.9–10.3)
Chloride: 105 mmol/L (ref 98–111)
Creatinine, Ser: 0.84 mg/dL (ref 0.44–1.00)
GFR, Estimated: >60 mL/min (ref >60)
Glucose, Bld: 101 mg/dL — ABNORMAL HIGH (ref 70–99)
Potassium: 3.8 mmol/L (ref 3.5–5.1)
Sodium: 140 mmol/L (ref 135–145)

## 2024-02-02 LAB — TROPONIN I (HIGH SENSITIVITY)
Troponin I (High Sensitivity): 3 ng/L (ref <18)
Troponin I (High Sensitivity): 3 ng/L (ref <18)

## 2024-02-02 MED ORDER — IOHEXOL 350 MG/ML SOLN
75.0000 mL | Freq: Once | INTRAVENOUS | Status: AC | PRN
  Administered 2024-02-02: 75 mL via INTRAVENOUS

## 2024-02-02 MED ORDER — ENOXAPARIN SODIUM 100 MG/ML IJ SOSY
1.0000 mg/kg | PREFILLED_SYRINGE | Freq: Once | INTRAMUSCULAR | Status: AC
  Administered 2024-02-02: 90 mg via SUBCUTANEOUS
  Filled 2024-02-02: qty 1

## 2024-02-02 NOTE — ED Triage Notes (Signed)
 Pov from home. Cc of left leg pain a week ago but worse tonight. Around midnight tonight she was woke up by chest pain. General heaviness. Says she is on a medication that has a risk for blood clots  6/10 leg.  Unable to put a number on heaviness.

## 2024-02-02 NOTE — Discharge Instructions (Addendum)
 Please return later today as scheduled for your leg ultrasound

## 2024-02-02 NOTE — ED Notes (Signed)
 Per pt she no longer has a port

## 2024-02-02 NOTE — ED Provider Notes (Signed)
 [place] EMERGENCY DEPARTMENT AT [facility]  Provider Note  CSN: [id number] Arrival date & time: 02/02/24 0100  History Chief Complaint  Patient presents with   Chest Pain   Leg Pain    Debra [name] is a 63 y.o. female with history of breast cancer, currently on maintenance tamoxifen  reports several days of L>R BLE leg soreness. She woke up about an hour PTA with worsening pain in L leg and chest heaviness. She denies any leg swelling but is concerned for DVT/PE due to her history. No prior cardiac issues.    Home Medications Prior to Admission medications   Medication Sig Start Date End Date Taking? Authorizing Provider  b complex vitamins capsule Take 1 capsule by mouth daily.    [provider]  Multiple Vitamin (MULTI-VITAMIN DAILY PO) Take 1 tablet by mouth daily.     [provider]  tamoxifen  (NOLVADEX ) 20 MG tablet TAKE 1 TABLET BY MOUTH EVERY DAY 12/04/22   Causey, Lindsey Cornetto, NP  prochlorperazine  (COMPAZINE ) 10 MG tablet Take 1 tablet (10 mg total) by mouth every 6 (six) hours as needed (Nausea or vomiting). 07/30/19 12/11/19  Magrinat, Sandria BROCKS, MD     Allergies    Other   Review of Systems   Review of Systems Please see HPI for pertinent positives and negatives  Physical Exam BP (!) 123/58   Pulse 72   Temp (!) 97.5 F (36.4 C) (Oral)   Resp 19   Ht 5' 9 (1.753 m)   Wt 90.7 kg   LMP 05/09/2011   SpO2 98%   BMI 29.53 kg/m   Physical Exam Vitals and nursing note reviewed.  Constitutional:      Appearance: Normal appearance.  HENT:     Head: Normocephalic and atraumatic.     Nose: Nose normal.     Mouth/Throat:     Mouth: Mucous membranes are moist.  Eyes:     Extraocular Movements: Extraocular movements intact.     Conjunctiva/sclera: Conjunctivae normal.  Cardiovascular:     Rate and Rhythm: Normal rate.  Pulmonary:     Effort: Pulmonary effort is normal.     Breath sounds: Normal breath sounds.   Abdominal:     General: Abdomen is flat.     Palpations: Abdomen is soft.     Tenderness: There is no abdominal tenderness.  Musculoskeletal:        General: No swelling. Normal range of motion.     Cervical back: Neck supple.     Right lower leg: No tenderness. No edema.     Left lower leg: No tenderness. No edema.  Skin:    General: Skin is warm and dry.  Neurological:     General: No focal deficit present.     Mental Status: She is alert.  Psychiatric:        Mood and Affect: Mood normal.     ED Results / Procedures / Treatments   EKG None  Procedures Procedures  Medications Ordered in the ED Medications  enoxaparin (LOVENOX) injection 90 mg (has no administration in time range)  iohexol (OMNIPAQUE) 350 MG/ML injection 75 mL (75 mLs Intravenous Contrast Given 02/02/24 0216)    Initial Impression and Plan  Patient here with BLE pain but no swelling and chest tightness with a specific concern for DVT/PE. She is low risk for ACS. Will check labs, send for CTA.   ED Course   Clinical Course as of 02/02/24 [phone]  Sat Feb 02, 2024  0151 CBC is normal.  [CS]  0211 BMP and Trop are neg.  [CS]  0253 I personally viewed the images from radiology studies and agree with radiologist interpretation: CTA is neg for PE.  [CS]  0303 Patient resting comfortably. Will check delta trop, if remains neg, plan lovenox and return in AM for doppler of LE.  [CS]  0353 Trop remains normal. Lovenox and outpatient doppler ordered. Otherwise stable for discharge home. PCP follow up, RTED for any other concerns.   [CS]    Clinical Course User Index [CS] Roselyn Carlin NOVAK, MD     MDM Rules/Calculators/A&P Medical Decision Making Given presenting complaint, I considered that admission might be necessary. After review of results from ED lab and/or imaging studies, admission to the hospital is not indicated at this time.    Problems Addressed: Atypical chest pain: acute illness or  injury Pain in both lower extremities: acute illness or injury  Amount and/or Complexity of Data Reviewed Labs: ordered. Decision-making details documented in ED Course. Radiology: ordered and independent interpretation performed. Decision-making details documented in ED Course. ECG/medicine tests: ordered and independent interpretation performed. Decision-making details documented in ED Course.  Risk Prescription drug management. Decision regarding hospitalization.     Final Clinical Impression(s) / ED Diagnoses Final diagnoses:  Atypical chest pain  Pain in both lower extremities    Rx / DC Orders ED Discharge Orders          Ordered    Lower Ext Bilat Venous US        Comments: IMPORTANT PATIENT INSTRUCTIONS:  Your ED provider has recommended an Outpatient Ultrasound.  Please call [phone] to schedule an appointment.  If your appointment is scheduled for a Saturday, Sunday or holiday, please go to the [facility] Emergency Department Registration Desk at least 15 minutes prior to your appointment time and tell them you are there for an ultrasound.    If your appointment is scheduled for a weekday (Monday-Friday), please go directly to the [facility] Radiology Department at least 15 minutes prior to your appointment time and tell them you are there for an ultrasound.  Please call [phone] with questions.   02/02/24 0354             Roselyn Carlin NOVAK, MD 02/02/24 [phone]

## 2024-02-08 ENCOUNTER — Ambulatory Visit (HOSPITAL_COMMUNITY)
Admission: RE | Admit: 2024-02-08 | Discharge: 2024-02-08 | Disposition: A | Discharge status: Home / Self Care | Source: Ambulatory Visit | Attending: Emergency Medicine | Admitting: Emergency Medicine

## 2024-02-08 DIAGNOSIS — M79605 Pain in left leg: Secondary | ICD-10-CM | POA: Diagnosis present

## 2024-02-08 DIAGNOSIS — Z79810 Long term (current) use of selective estrogen receptor modulators (SERMs): Secondary | ICD-10-CM | POA: Diagnosis not present

## 2024-02-08 DIAGNOSIS — Z853 Personal history of malignant neoplasm of breast: Secondary | ICD-10-CM | POA: Diagnosis not present

## 2024-02-08 DIAGNOSIS — M79604 Pain in right leg: Secondary | ICD-10-CM | POA: Diagnosis not present

## 2024-03-01 ENCOUNTER — Other Ambulatory Visit: Payer: Self-pay | Admitting: Adult Health

## 2024-09-26 ENCOUNTER — Ambulatory Visit: Admitting: Hematology and Oncology
